# Patient Record
Sex: Male | Born: 1950
Health system: Southern US, Community
[De-identification: ages and names within clinical notes are randomized; demographics above are authoritative.]

## PROBLEM LIST (undated history)

## (undated) DIAGNOSIS — N400 Enlarged prostate without lower urinary tract symptoms: Secondary | ICD-10-CM

## (undated) DIAGNOSIS — I10 Essential (primary) hypertension: Secondary | ICD-10-CM

## (undated) DIAGNOSIS — I4891 Unspecified atrial fibrillation: Secondary | ICD-10-CM

## (undated) DIAGNOSIS — G4730 Sleep apnea, unspecified: Secondary | ICD-10-CM

## (undated) DIAGNOSIS — K219 Gastro-esophageal reflux disease without esophagitis: Secondary | ICD-10-CM

## (undated) DIAGNOSIS — R200 Anesthesia of skin: Secondary | ICD-10-CM

## (undated) HISTORY — PX: SHOULDER SURGERY: SHX246

## (undated) HISTORY — PX: NASAL RECONSTRUCTION WITH SEPTAL REPAIR: SHX5665

## (undated) HISTORY — PX: TONSILLECTOMY: SUR1361

## (undated) HISTORY — PX: REPLACEMENT TOTAL KNEE: SUR1224

---

## 2007-01-31 ENCOUNTER — Ambulatory Visit: Payer: Self-pay | Admitting: Ophthalmology

## 2008-09-21 ENCOUNTER — Inpatient Hospital Stay (HOSPITAL_COMMUNITY): Admission: EM | Admit: 2008-09-21 | Discharge: 2008-09-23 | Payer: Self-pay | Admitting: Gastroenterology

## 2008-09-22 ENCOUNTER — Encounter (INDEPENDENT_AMBULATORY_CARE_PROVIDER_SITE_OTHER): Payer: Self-pay | Admitting: Gastroenterology

## 2010-07-14 LAB — CBC
HCT: 22.5 % — ABNORMAL LOW (ref 39.0–52.0)
HCT: 23.5 % — ABNORMAL LOW (ref 39.0–52.0)
HCT: 26.3 % — ABNORMAL LOW (ref 39.0–52.0)
Hemoglobin: 7.7 g/dL — CL (ref 13.0–17.0)
Hemoglobin: 7.9 g/dL — CL (ref 13.0–17.0)
Hemoglobin: 8.9 g/dL — ABNORMAL LOW (ref 13.0–17.0)
MCHC: 33.7 g/dL (ref 30.0–36.0)
MCHC: 33.8 g/dL (ref 30.0–36.0)
MCHC: 33.9 g/dL (ref 30.0–36.0)
MCV: 86.8 fL (ref 78.0–100.0)
MCV: 87.8 fL (ref 78.0–100.0)
MCV: 89 fL (ref 78.0–100.0)
Platelets: 226 10*3/uL (ref 150–400)
Platelets: 226 10*3/uL (ref 150–400)
Platelets: 236 10*3/uL (ref 150–400)
RBC: 2.6 MIL/uL — ABNORMAL LOW (ref 4.22–5.81)
RBC: 2.68 MIL/uL — ABNORMAL LOW (ref 4.22–5.81)
RBC: 2.96 MIL/uL — ABNORMAL LOW (ref 4.22–5.81)
RDW: 14.4 % (ref 11.5–15.5)
RDW: 14.4 % (ref 11.5–15.5)
RDW: 14.9 % (ref 11.5–15.5)
WBC: 10.8 10*3/uL — ABNORMAL HIGH (ref 4.0–10.5)
WBC: 9.1 10*3/uL (ref 4.0–10.5)
WBC: 9.7 10*3/uL (ref 4.0–10.5)

## 2010-07-14 LAB — BASIC METABOLIC PANEL
BUN: 19 mg/dL (ref 6–23)
BUN: 23 mg/dL (ref 6–23)
CO2: 25 mEq/L (ref 19–32)
CO2: 27 mEq/L (ref 19–32)
Calcium: 7.5 mg/dL — ABNORMAL LOW (ref 8.4–10.5)
Calcium: 7.8 mg/dL — ABNORMAL LOW (ref 8.4–10.5)
Chloride: 108 mEq/L (ref 96–112)
Chloride: 112 mEq/L (ref 96–112)
Creatinine, Ser: 0.78 mg/dL (ref 0.4–1.5)
Creatinine, Ser: 0.88 mg/dL (ref 0.4–1.5)
GFR calc Af Amer: >60 mL/min (ref >60)
GFR calc Af Amer: >60 mL/min (ref >60)
GFR calc non Af Amer: >60 mL/min (ref >60)
GFR calc non Af Amer: >60 mL/min (ref >60)
Glucose, Bld: 98 mg/dL (ref 70–99)
Glucose, Bld: 99 mg/dL (ref 70–99)
Potassium: 3.6 mEq/L (ref 3.5–5.1)
Potassium: 4.3 mEq/L (ref 3.5–5.1)
Sodium: 137 mEq/L (ref 135–145)
Sodium: 140 mEq/L (ref 135–145)

## 2010-07-14 LAB — CROSSMATCH
ABO/RH(D): A POS
Antibody Screen: NEGATIVE

## 2010-07-14 LAB — DIFFERENTIAL
Basophils Absolute: 0 10*3/uL (ref 0.0–0.1)
Basophils Relative: 0 % (ref 0–1)
Eosinophils Absolute: 0.6 10*3/uL (ref 0.0–0.7)
Eosinophils Relative: 6 % — ABNORMAL HIGH (ref 0–5)
Lymphocytes Relative: 27 % (ref 12–46)
Lymphs Abs: 2.6 10*3/uL (ref 0.7–4.0)
Monocytes Absolute: 0.6 10*3/uL (ref 0.1–1.0)
Monocytes Relative: 7 % (ref 3–12)
Neutro Abs: 5.8 10*3/uL (ref 1.7–7.7)
Neutrophils Relative %: 61 % (ref 43–77)

## 2010-07-14 LAB — PREPARE RBC (CROSSMATCH)

## 2010-07-14 LAB — ABO/RH: ABO/RH(D): A POS

## 2010-08-19 NOTE — H&P (Signed)
Jeffrey French, Jeffrey French                 ACCOUNT NO.:  [id number]   MEDICAL RECORD NO.:  [id number]          PATIENT TYPE:  INP   LOCATION:  4713                         FACILITY:  [facility]   PHYSICIAN:  Jordan Hawks. Elnoria Howard, MD    DATE OF BIRTH:  Mar 13, 1951   DATE OF ADMISSION:  09/21/2008  DATE OF DISCHARGE:                              HISTORY & PHYSICAL   REASON FOR ADMISSION:  GI bleed.   HISTORY OF PRESENT ILLNESS:  This is a 60 year old gentleman with a past  medical history of depression who is admitted to the hospital with  complaints of orthostasis.  The patient states that for the past week,  he has noted feeling a little bit dizzy with standing up, but this is  not significant for him to seek medical treatment.  Yesterday, he was  working in his yard.  He noticed that he was markedly worse in regards  to the weakness and fatigue.  His arms and legs were very tired from a  sitting to standing position.  However, he did not feel that he had any  significant issues on the day of admission.  The patient was working in  his garden, and his symptoms continued to worsen.  He went to the  bathroom and had a bowel movement.  At that time, he had multiple bowel  movements; and during those episodes, he had difficulty with standing  from the toilet.  Each time he was standing he felt markedly weak to the  point where he almost passed out.  During his final bout of having a  bowel movement, his wife had to assist him, and at that time, she noted  that his last stools were black.  The patient does not usually look at  his stools and did not noticed any type of abnormalities before this  time.  Because of the major significant symptoms, he presented to the  emergency room at [facility] for further evaluation and treatment.  EMS was called, and he was noted to be orthostatic by pulse.  Upon  presentation to [facility], routine blood work was performed.  It  was noted that his hemoglobin  was in the eight range.  His baseline is  unknown at this time.  His BUN is elevated at 42 with a creatinine of  1.1, and he had normal coags.  The patient denies any complaints of  abdominal pain or diarrhea.  No prior history of peptic ulcer disease.  He has never had a colonoscopy, and his mother had colon cancer at the  age of 62.  He is used 1 dose of Goody Powder approximately 1 week ago,  but denies ever using any routine NSAID use.   PAST MEDICAL HISTORY AND PAST SURGICAL HISTORY:  As stated above.   FAMILY HISTORY:  As stated above in the history of present illness.   SOCIAL HISTORY:  Negative for alcohol, tobacco, or illicit drug use.   REVIEW OF SYSTEMS:  As stated above in the history of present illness,  otherwise negative.   HOME MEDICATIONS:  1.  Wellbutrin 150 mg p.o. daily.  2. Effexor 225 mg p.o. daily.   ALLERGIES:  No known drug allergies.   PHYSICAL EXAMINATION:  VITAL SIGNS:  Blood pressure is 121/76, heart  rate is 97, respirations 18, and temperature is 99.3.  GENERAL:  The patient is in no acute distress, alert and oriented.  HEENT:  Normocephalic, atraumatic.  Extraocular muscles intact.  NECK:  Supple.  No lymphadenopathy.  LUNGS:  Clear to auscultation bilaterally.  CARDIOVASCULAR:  Regular rate and rhythm although he is tachycardic.  ABDOMEN:  Flat, soft, nontender, and nondistended.  Positive bowel  sounds.  EXTREMITIES:  No clubbing, cyanosis, or edema.  RECTAL:  Heme positive and grossly melenic.   LABORATORY VALUES:  White blood cell count is 10.3, hemoglobin 8.0, MCV  is 84.5, and platelets at 295.  Sodium 140, potassium 4.3, chloride 103,  CO2 of 26, glucose 161, BUN 42, creatinine 1.1.  PT is 10.1, INR is 0.9,  and PTT is 17.4.  These laboratory values were performed at [facility].   IMPRESSION:  1. Gastrointestinal bleed, probable upper.  2. Orthostatic hypertension.  3. Family history of colon cancer.   At this time, the  patient will require an EGD for further evaluation;  however, he has orthostatic hypertension.  Upon rest, I am able to  detect that the patient is tachycardic.  He will require aggressive  rehydration and blood transfusions.   PLAN:  1. IV hydration.  2. Transfused 2 units of packed red blood cells now.  3. Protonix 40 mg p.o. daily.  4. EGD in the a.m., and further recommendation is pending to findings      of the EGD.      Jordan Hawks Elnoria Howard, MD  Electronically Signed     PDH/MEDQ  D:  09/21/2008  T:  09/22/2008  Job:  [id number]

## 2010-08-19 NOTE — Discharge Summary (Signed)
Jeffrey French, Jeffrey French                 ACCOUNT NO.:  [id number]   MEDICAL RECORD NO.:  [id number]          PATIENT TYPE:  INP   LOCATION:  4713                         FACILITY:  [facility]   PHYSICIAN:  Jordan Hawks. Elnoria Howard, MD    DATE OF BIRTH:  07/09/1950   DATE OF ADMISSION:  09/21/2008  DATE OF DISCHARGE:  09/23/2008                               DISCHARGE SUMMARY   DISCHARGE DIAGNOSES:  1. Duodenal ulcer.  2. Orthostatic hypotension secondary to the bleeding ulcer.  3. Depression.   HISTORY AND PHYSICAL:  Please see the original history and physical for  full details.   HOSPITAL COURSE:  The patient was admitted to the hospital and  aggressively hydrated with IV fluids and he was transfused with 2 units  of packed red blood cells.  Unfortunately, this did increase his  hemoglobin to a significant amount.  He was noted to be at 8.0 at the  outside hospital and after 2 units at [facility] he rose to 7.9.  Subsequently, he underwent an EGD and he was noted to have a nonbleeding  clean-based duodenal ulcer.  No other significant abnormalities were  identified, although biopsies of the gastric antrum were obtained for  mild gastritis.  Overall, the patient tolerated the procedure well and  because of his continued orthostasis, he was transfused an additional 2  units of packed red blood cells.  On the day of discharge, the patient  felt well and in fact on the day prior, he was able to ambulate without  difficulty.  No further complaints of dizziness and no complaints of any  melena.  His hemoglobin rose to approximately 8.9 and repeat orthostatic  parameters were negative for hypotension.  Because the patient did not  have any further complaints, it was felt that he could be discharged  home safely.  During this hospitalization, he was also treated with  Protonix 40 mg p.o. daily.   PLAN:  1. Follow up with Dr. Elnoria Howard in 2 weeks.  2. Take omeprazole 40 mg p.o. daily.  3. Iron  supplementation over-the-counter.  He is to increase to t.i.d.      if tolerated and the patient is also instructed to call me if he      should have any further signs or symptoms of bleeding.      Jordan Hawks Elnoria Howard, MD  Electronically Signed     PDH/MEDQ  D:  09/23/2008  T:  09/23/2008  Job:  [id number]

## 2010-12-15 ENCOUNTER — Ambulatory Visit: Payer: Self-pay | Admitting: Orthopedic Surgery

## 2010-12-24 ENCOUNTER — Inpatient Hospital Stay: Payer: Self-pay | Admitting: Specialist

## 2011-01-13 ENCOUNTER — Encounter: Payer: Self-pay | Admitting: Orthopedic Surgery

## 2011-02-05 ENCOUNTER — Ambulatory Visit: Payer: Self-pay | Admitting: Orthopedic Surgery

## 2011-02-05 ENCOUNTER — Encounter: Payer: Self-pay | Admitting: Orthopedic Surgery

## 2011-02-19 ENCOUNTER — Inpatient Hospital Stay: Payer: Self-pay | Admitting: Orthopedic Surgery

## 2011-03-04 ENCOUNTER — Emergency Department: Payer: Self-pay | Admitting: Emergency Medicine

## 2011-03-11 ENCOUNTER — Emergency Department: Payer: Self-pay | Admitting: Emergency Medicine

## 2011-03-16 ENCOUNTER — Encounter: Payer: Self-pay | Admitting: Orthopedic Surgery

## 2011-04-07 ENCOUNTER — Encounter: Payer: Self-pay | Admitting: Orthopedic Surgery

## 2013-08-10 LAB — LIPID PANEL
Cholesterol: 185 mg/dL (ref 0–200)
HDL: 47 mg/dL (ref 35–70)
LDL Cholesterol: 103 mg/dL
LDl/HDL Ratio: 2.2
Triglycerides: 175 mg/dL — AB (ref 40–160)

## 2013-08-10 LAB — CBC AND DIFFERENTIAL
HCT: 44 % (ref 41–53)
Hemoglobin: 14.1 g/dL (ref 13.5–17.5)
Neutrophils Absolute: 4 /uL
Platelets: 331 10*3/uL (ref 150–399)
WBC: 8.2 10^3/mL

## 2013-08-10 LAB — HEPATIC FUNCTION PANEL
ALT: 20 U/L (ref 10–40)
AST: 20 U/L (ref 14–40)
Alkaline Phosphatase: 98 U/L (ref 25–125)
Bilirubin, Total: 1.1 mg/dL

## 2013-08-10 LAB — BASIC METABOLIC PANEL
BUN: 14 mg/dL (ref 4–21)
Creatinine: 1.1 mg/dL (ref 0.6–1.3)
Glucose: 92 mg/dL
Potassium: 4.8 mmol/L (ref 3.4–5.3)
Sodium: 138 mmol/L (ref 137–147)

## 2013-08-10 LAB — TSH: TSH: 1.92 u[IU]/mL (ref 0.41–5.90)

## 2013-09-15 ENCOUNTER — Ambulatory Visit: Payer: Self-pay | Admitting: Physician Assistant

## 2013-10-19 DIAGNOSIS — R972 Elevated prostate specific antigen [PSA]: Secondary | ICD-10-CM | POA: Insufficient documentation

## 2013-11-21 LAB — HM COLONOSCOPY

## 2014-02-08 LAB — PSA: PSA: 8

## 2014-03-22 DIAGNOSIS — Z87891 Personal history of nicotine dependence: Secondary | ICD-10-CM | POA: Diagnosis not present

## 2014-03-22 DIAGNOSIS — M1990 Unspecified osteoarthritis, unspecified site: Secondary | ICD-10-CM | POA: Diagnosis not present

## 2014-03-22 DIAGNOSIS — N138 Other obstructive and reflux uropathy: Secondary | ICD-10-CM | POA: Diagnosis not present

## 2014-03-22 DIAGNOSIS — R972 Elevated prostate specific antigen [PSA]: Secondary | ICD-10-CM | POA: Diagnosis not present

## 2014-03-22 DIAGNOSIS — N401 Enlarged prostate with lower urinary tract symptoms: Secondary | ICD-10-CM | POA: Diagnosis not present

## 2014-03-22 DIAGNOSIS — N3941 Urge incontinence: Secondary | ICD-10-CM | POA: Diagnosis not present

## 2014-03-22 DIAGNOSIS — R3914 Feeling of incomplete bladder emptying: Secondary | ICD-10-CM | POA: Diagnosis not present

## 2014-03-22 DIAGNOSIS — Z79899 Other long term (current) drug therapy: Secondary | ICD-10-CM | POA: Diagnosis not present

## 2014-03-22 DIAGNOSIS — F329 Major depressive disorder, single episode, unspecified: Secondary | ICD-10-CM | POA: Diagnosis not present

## 2014-03-22 DIAGNOSIS — I10 Essential (primary) hypertension: Secondary | ICD-10-CM | POA: Diagnosis not present

## 2014-05-25 DIAGNOSIS — H15002 Unspecified scleritis, left eye: Secondary | ICD-10-CM | POA: Diagnosis not present

## 2014-06-14 DIAGNOSIS — M545 Low back pain: Secondary | ICD-10-CM | POA: Diagnosis not present

## 2014-06-14 DIAGNOSIS — M5416 Radiculopathy, lumbar region: Secondary | ICD-10-CM | POA: Diagnosis not present

## 2014-06-23 ENCOUNTER — Ambulatory Visit: Payer: Self-pay | Admitting: Orthopedic Surgery

## 2014-06-23 DIAGNOSIS — M4316 Spondylolisthesis, lumbar region: Secondary | ICD-10-CM | POA: Diagnosis not present

## 2014-06-23 DIAGNOSIS — M4806 Spinal stenosis, lumbar region: Secondary | ICD-10-CM | POA: Diagnosis not present

## 2014-06-23 DIAGNOSIS — M5126 Other intervertebral disc displacement, lumbar region: Secondary | ICD-10-CM | POA: Diagnosis not present

## 2014-06-26 DIAGNOSIS — M5126 Other intervertebral disc displacement, lumbar region: Secondary | ICD-10-CM | POA: Diagnosis not present

## 2014-08-06 DIAGNOSIS — J0140 Acute pansinusitis, unspecified: Secondary | ICD-10-CM | POA: Diagnosis not present

## 2014-08-21 DIAGNOSIS — M1991 Primary osteoarthritis, unspecified site: Secondary | ICD-10-CM | POA: Diagnosis not present

## 2014-08-21 DIAGNOSIS — Z0000 Encounter for general adult medical examination without abnormal findings: Secondary | ICD-10-CM | POA: Diagnosis not present

## 2014-08-21 DIAGNOSIS — I10 Essential (primary) hypertension: Secondary | ICD-10-CM | POA: Diagnosis not present

## 2014-10-30 DIAGNOSIS — H903 Sensorineural hearing loss, bilateral: Secondary | ICD-10-CM | POA: Diagnosis not present

## 2014-11-08 DIAGNOSIS — R972 Elevated prostate specific antigen [PSA]: Secondary | ICD-10-CM | POA: Diagnosis not present

## 2014-11-08 DIAGNOSIS — N401 Enlarged prostate with lower urinary tract symptoms: Secondary | ICD-10-CM | POA: Diagnosis not present

## 2014-11-08 DIAGNOSIS — N138 Other obstructive and reflux uropathy: Secondary | ICD-10-CM | POA: Diagnosis not present

## 2014-11-08 DIAGNOSIS — R339 Retention of urine, unspecified: Secondary | ICD-10-CM | POA: Diagnosis not present

## 2014-11-24 ENCOUNTER — Other Ambulatory Visit: Payer: Self-pay | Admitting: Family Medicine

## 2015-02-12 ENCOUNTER — Telehealth: Payer: Self-pay | Admitting: Family Medicine

## 2015-02-12 NOTE — Telephone Encounter (Signed)
[employer] contacted him and said the   Venlafaxine HCl...  They would only pay for 30 per month.  They need you to authorize 90 per month.  Pt's contact number is [phone].  Please leave a message if no one answers.  Thanks Con Memos

## 2015-02-12 NOTE — Telephone Encounter (Signed)
lmtcb-aa, need to get clarification on what is needed,

## 2015-02-14 NOTE — Telephone Encounter (Signed)
Called no answer and unable to leave a message-aa Have made attempts to get in touch with patient Will wait for patient to call us back to let us know what exactly is needed.

## 2015-02-18 DIAGNOSIS — F329 Major depressive disorder, single episode, unspecified: Secondary | ICD-10-CM | POA: Insufficient documentation

## 2015-02-18 DIAGNOSIS — F32A Depression, unspecified: Secondary | ICD-10-CM | POA: Insufficient documentation

## 2015-02-18 DIAGNOSIS — G4700 Insomnia, unspecified: Secondary | ICD-10-CM | POA: Insufficient documentation

## 2015-02-18 DIAGNOSIS — F411 Generalized anxiety disorder: Secondary | ICD-10-CM | POA: Insufficient documentation

## 2015-02-18 DIAGNOSIS — R972 Elevated prostate specific antigen [PSA]: Secondary | ICD-10-CM | POA: Insufficient documentation

## 2015-02-18 DIAGNOSIS — N400 Enlarged prostate without lower urinary tract symptoms: Secondary | ICD-10-CM | POA: Insufficient documentation

## 2015-02-18 DIAGNOSIS — K279 Peptic ulcer, site unspecified, unspecified as acute or chronic, without hemorrhage or perforation: Secondary | ICD-10-CM | POA: Insufficient documentation

## 2015-02-18 DIAGNOSIS — M1990 Unspecified osteoarthritis, unspecified site: Secondary | ICD-10-CM | POA: Insufficient documentation

## 2015-02-18 DIAGNOSIS — D649 Anemia, unspecified: Secondary | ICD-10-CM | POA: Insufficient documentation

## 2015-02-18 DIAGNOSIS — I10 Essential (primary) hypertension: Secondary | ICD-10-CM | POA: Insufficient documentation

## 2015-02-20 ENCOUNTER — Ambulatory Visit (INDEPENDENT_AMBULATORY_CARE_PROVIDER_SITE_OTHER): Payer: Medicare Other | Admitting: Family Medicine

## 2015-02-20 ENCOUNTER — Encounter: Payer: Self-pay | Admitting: Family Medicine

## 2015-02-20 VITALS — BP 134/64 | HR 68 | Temp 97.6°F | Resp 16 | Wt 227.0 lb

## 2015-02-20 DIAGNOSIS — R432 Parageusia: Secondary | ICD-10-CM

## 2015-02-20 DIAGNOSIS — F32A Depression, unspecified: Secondary | ICD-10-CM

## 2015-02-20 DIAGNOSIS — R430 Anosmia: Secondary | ICD-10-CM

## 2015-02-20 DIAGNOSIS — F329 Major depressive disorder, single episode, unspecified: Secondary | ICD-10-CM

## 2015-02-20 DIAGNOSIS — I10 Essential (primary) hypertension: Secondary | ICD-10-CM

## 2015-02-20 DIAGNOSIS — G2581 Restless legs syndrome: Secondary | ICD-10-CM | POA: Diagnosis not present

## 2015-02-20 MED ORDER — ROPINIROLE HCL 2 MG PO TABS: 2.0000 mg | ORAL_TABLET | Freq: Every day | ORAL | Status: DC

## 2015-02-20 MED ORDER — VENLAFAXINE HCL ER 75 MG PO CP24: 225.0000 mg | ORAL_CAPSULE | Freq: Every day | ORAL | Status: DC

## 2015-02-20 NOTE — Progress Notes (Signed)
Patient ID: Jeffrey French, male   DOB: 07-04-1950, 64 y.o.   MRN: [id number]    Subjective:  HPI  Hypertension, follow-up:  BP Readings from Last 3 Encounters:  02/20/15 134/64  08/21/14 128/62    He was last seen for hypertension 6 months ago.  BP at that visit was 128/62. Management since that visit includes none. He reports good compliance with treatment. He is not having side effects.  He is not exercising.  Outside blood pressures are being checked occasionally. He is experiencing none.  Patient denies chest pain, chest pressure/discomfort, claudication, dyspnea, exertional chest pressure/discomfort, fatigue, irregular heart beat, lower extremity edema and palpitations.   Cardiovascular risk factors include advanced age (older than 47 for men, 57 for women), hypertension and male gender.   Wt Readings from Last 3 Encounters:  02/20/15 227 lb (102.967 kg)  08/21/14 238 lb (107.956 kg)    ------------------------------------------------------------------------   Pt reports that he is doing well emotionally. He has a paper from his [employer] stating that they will not cover Venlafaxine. He does not want to stop it because he has been stable on it for so long, so he may need PA. It is of note that for the past 2 months the patient has had a decreased sense of taste and smell. No other symptoms or neurologic issues. No other ENT issues. Asian also has some mild to moderate issues with restless leg syndrome through the night. He does wish to try to treat this. Prior to Admission medications   Medication Sig Start Date End Date Taking? Authorizing Provider  alprazolam Duanne Moron) 2 MG tablet Take by mouth. 03/10/11  Yes Historical Provider, MD  amLODipine (NORVASC) 5 MG tablet Take by mouth. 04/15/14  Yes Historical Provider, MD  baclofen (LIORESAL) 10 MG tablet Take by mouth. 06/07/14  Yes Historical Provider, MD  buPROPion (WELLBUTRIN XL) 300 MG 24 hr tablet Take by mouth.  04/15/14  Yes Historical Provider, MD  chlorpheniramine (CHLOR-TRIMETON) 4 MG tablet Take by mouth.   Yes Historical Provider, MD  Cholecalciferol (VITAMIN D) 2000 UNITS CAPS Take by mouth. 02/10/11  Yes Historical Provider, MD  folic acid (FOLVITE) 629 MCG tablet Take by mouth. 02/10/11  Yes Historical Provider, MD  Melatonin 1 MG TABS Take by mouth.   Yes Historical Provider, MD  metoprolol succinate (TOPROL-XL) 50 MG 24 hr tablet 1 (ONE) TABLET ER 24HR TABLET ER 24HR, ORAL, DAILY 11/24/14  Yes Jerrol Banana., MD  omeprazole (PRILOSEC) 20 MG capsule Take by mouth. 08/21/14  Yes Historical Provider, MD  tamsulosin (FLOMAX) 0.4 MG CAPS capsule Take by mouth. 02/08/14  Yes Historical Provider, MD  temazepam (RESTORIL) 30 MG capsule Take by mouth. 11/21/12  Yes Historical Provider, MD  venlafaxine XR (EFFEXOR-XR) 75 MG 24 hr capsule Take by mouth. 04/15/14  Yes Historical Provider, MD  meloxicam (MOBIC) 15 MG tablet Take by mouth. 06/07/14   Historical Provider, MD    Patient Active Problem List   Diagnosis Date Noted  . Absolute anemia 02/18/2015  . Benign fibroma of prostate 02/18/2015  . Clinical depression 02/18/2015  . Abnormal prostate specific antigen 02/18/2015  . Essential (primary) hypertension 02/18/2015  . Anxiety, generalized 02/18/2015  . Cannot sleep 02/18/2015  . Arthritis, degenerative 02/18/2015  . Peptic ulcer 02/18/2015  . Elevated prostate specific antigen (PSA) 10/19/2013    History reviewed. No pertinent past medical history.  Social History   Social History  . Marital Status: Married    Spouse  Name: N/A  . Number of Children: N/A  . Years of Education: N/A   Occupational History  . Not on file.   Social History Main Topics  . Smoking status: Former Research scientist (life sciences)  . Smokeless tobacco: Not on file     Comment: 1974  . Alcohol Use: Yes     Comment: occasionally  . Drug Use: No  . Sexual Activity: Not on file   Other Topics Concern  . Not on file   Social  History Narrative    Allergies  Allergen Reactions  . Sulfamethoxazole-Trimethoprim     Review of Systems  Constitutional: Negative.   HENT: Negative.   Eyes: Negative.   Respiratory: Negative.   Cardiovascular: Negative.   Gastrointestinal: Negative.   Genitourinary: Negative.   Musculoskeletal: Negative.   Skin: Negative.   Neurological: Negative.   Endo/Heme/Allergies: Negative.   Psychiatric/Behavioral: Negative.     Immunization History  Administered Date(s) Administered  . Hepatitis A 11/29/2009  . Tdap 11/29/2009  . Zoster 01/19/2013   Objective:  BP 134/64 mmHg  Pulse 68  Temp(Src) 97.6 F (36.4 C) (Oral)  Resp 16  Wt 227 lb (102.967 kg)  Physical Exam  Constitutional: He is oriented to person, place, and time and well-developed, well-nourished, and in no distress.  HENT:  Head: Normocephalic and atraumatic.  Right Ear: External ear normal.  Left Ear: External ear normal.  Nose: Nose normal.  Mouth/Throat: Oropharynx is clear and moist.  Eyes: Conjunctivae and EOM are normal. Pupils are equal, round, and reactive to light.  Neck: Neck supple.  Cardiovascular: Normal rate, regular rhythm, normal heart sounds and intact distal pulses.   Pulmonary/Chest: Effort normal and breath sounds normal.  Abdominal: Soft.  Neurological: He is alert and oriented to person, place, and time. Gait normal.  Skin: Skin is warm and dry.  Psychiatric: Mood, memory, affect and judgment normal.    Lab Results  Component Value Date   WBC 8.2 08/10/2013   HGB 14.1 08/10/2013   HCT 44 08/10/2013   PLT 331 08/10/2013   GLUCOSE 98 09/22/2008   CHOL 185 08/10/2013   TRIG 175* 08/10/2013   HDL 47 08/10/2013   LDLCALC 103 08/10/2013   TSH 1.92 08/10/2013   PSA 8.0 02/08/2014    CMP     Component Value Date/Time   NA 138 08/10/2013   NA 140 09/22/2008 1235   K 4.8 08/10/2013   CL 112 09/22/2008 1235   CO2 25 09/22/2008 1235   GLUCOSE 98 09/22/2008 1235   BUN 14  08/10/2013   BUN 19 09/22/2008 1235   CREATININE 1.1 08/10/2013   CREATININE 0.78 09/22/2008 1235   CALCIUM 7.5* 09/22/2008 1235   AST 20 08/10/2013   ALT 20 08/10/2013   ALKPHOS 98 08/10/2013   GFRNONAA >60 09/22/2008 1235   GFRAA  09/22/2008 1235    >60        The eGFR has been calculated using the MDRD equation. This calculation has not been validated in all clinical situations. eGFR's persistently <60 mL/min signify possible Chronic Kidney Disease.    Assessment and Plan :  1. Essential (primary) hypertension Presently controlled.  2. Clinical depression In remission. Continue present medications indefinitely. - venlafaxine XR (EFFEXOR-XR) 75 MG 24 hr capsule; Take 3 capsules (225 mg total) by mouth daily with breakfast.  Dispense: 90 capsule; Refill: 12  3. Restless leg syndrome  - rOPINIRole (REQUIP) 2 MG tablet; Take 1 tablet (2 mg total) by mouth at bedtime.  Dispense: 60 tablet; Refill: 5  4. Loss of smell  - Ambulatory referral to ENT  5. Loss of taste  - Ambulatory referral to ENT   [facility] 02/20/2015 8:22 AM

## 2015-03-08 ENCOUNTER — Ambulatory Visit (INDEPENDENT_AMBULATORY_CARE_PROVIDER_SITE_OTHER): Payer: Medicare Other | Admitting: Family Medicine

## 2015-03-08 ENCOUNTER — Encounter: Payer: Self-pay | Admitting: Family Medicine

## 2015-03-08 ENCOUNTER — Ambulatory Visit: Payer: Medicare Other | Admitting: Family Medicine

## 2015-03-08 VITALS — BP 152/68 | HR 80 | Temp 97.9°F | Resp 16 | Wt 229.0 lb

## 2015-03-08 DIAGNOSIS — J0100 Acute maxillary sinusitis, unspecified: Secondary | ICD-10-CM

## 2015-03-08 MED ORDER — AMOXICILLIN-POT CLAVULANATE 875-125 MG PO TABS: 1.0000 | ORAL_TABLET | Freq: Two times a day (BID) | ORAL | Status: DC

## 2015-03-08 NOTE — Progress Notes (Signed)
Patient ID: IZIK CHHOEUN, male   DOB: 08-08-1950, 64 y.o.   MRN: [id number] Name: Jeffrey French   MRN: [id number]    DOB: [birth year]   Date:03/08/2015       Progress Note  Subjective  Chief Complaint  Chief Complaint  Patient presents with  . Sinusitis    Sinusitis This is a new problem. The current episode started 1 to 4 weeks ago. The problem has been gradually worsening since onset. Associated symptoms include congestion and sinus pressure. Past treatments include nothing.  Usually sees Dr. Tami Ribas once this time of year for the same sinusitis. Having pressure and headache around eyes with rhinorrhea since 02-20-15. Using some Claritin for allergies. Denies cough or fever. Using some expectorant with decongestant.  Past Surgical History  Procedure Laterality Date  . Nasal reconstruction with septal repair    . Tonsillectomy    . Replacement total knee Right   . Shoulder surgery Left    Family History  Problem Relation Age of Onset  . Arthritis Mother   . Hypertension Mother   . Colon cancer Mother   . Other Mother     lymph node  . Diabetes Father   . Congestive Heart Failure Father   . Arthritis Sister    Patient Active Problem List   Diagnosis Date Noted  . Absolute anemia 02/18/2015  . Benign fibroma of prostate 02/18/2015  . Clinical depression 02/18/2015  . Abnormal prostate specific antigen 02/18/2015  . Essential (primary) hypertension 02/18/2015  . Anxiety, generalized 02/18/2015  . Cannot sleep 02/18/2015  . Arthritis, degenerative 02/18/2015  . Peptic ulcer 02/18/2015  . Elevated prostate specific antigen (PSA) 10/19/2013      Social History  Substance Use Topics  . Smoking status: Former Research scientist (life sciences)  . Smokeless tobacco: Not on file     Comment: 1974  . Alcohol Use: Yes     Comment: occasionally    Current outpatient prescriptions:  .  alprazolam (XANAX) 2 MG tablet, Take by mouth., Disp: , Rfl:  .  amLODipine (NORVASC) 5 MG tablet, Take by mouth.,  Disp: , Rfl:  .  baclofen (LIORESAL) 10 MG tablet, Take by mouth., Disp: , Rfl:  .  buPROPion (WELLBUTRIN XL) 300 MG 24 hr tablet, Take by mouth., Disp: , Rfl:  .  chlorpheniramine (CHLOR-TRIMETON) 4 MG tablet, Take by mouth., Disp: , Rfl:  .  Cholecalciferol (VITAMIN D) 2000 UNITS CAPS, Take by mouth., Disp: , Rfl:  .  folic acid (FOLVITE) Q000111Q MCG tablet, Take by mouth., Disp: , Rfl:  .  Melatonin 1 MG TABS, Take by mouth., Disp: , Rfl:  .  meloxicam (MOBIC) 15 MG tablet, Take by mouth., Disp: , Rfl:  .  metoprolol succinate (TOPROL-XL) 50 MG 24 hr tablet, 1 (ONE) TABLET ER 24HR TABLET ER 24HR, ORAL, DAILY, Disp: 30 tablet, Rfl: 8 .  omeprazole (PRILOSEC) 20 MG capsule, Take by mouth., Disp: , Rfl:  .  rOPINIRole (REQUIP) 2 MG tablet, Take 1 tablet (2 mg total) by mouth at bedtime., Disp: 60 tablet, Rfl: 5 .  tamsulosin (FLOMAX) 0.4 MG CAPS capsule, Take by mouth., Disp: , Rfl:  .  temazepam (RESTORIL) 30 MG capsule, Take by mouth., Disp: , Rfl:  .  venlafaxine XR (EFFEXOR-XR) 75 MG 24 hr capsule, Take 3 capsules (225 mg total) by mouth daily with breakfast., Disp: 90 capsule, Rfl: 12  Allergies  Allergen Reactions  . Sulfamethoxazole-Trimethoprim     Review of Systems  Constitutional: Negative.  HENT: Positive for congestion and sinus pressure.   Eyes: Negative.   Respiratory: Negative.   Cardiovascular: Negative.   Gastrointestinal: Negative.   Genitourinary: Negative.   Musculoskeletal: Negative.   Skin: Negative.   Neurological: Negative.   Endo/Heme/Allergies: Negative.   Psychiatric/Behavioral: Negative.     Objective  Filed Vitals:   03/08/15 1054  BP: 152/68  Pulse: 80  Temp: 97.9 F (36.6 C)  TempSrc: Oral  Resp: 16  Weight: 229 lb (103.874 kg)  SpO2: 97%    Physical Exam  Constitutional: He is oriented to person, place, and time and well-developed, well-nourished, and in no distress.  HENT:  Head: Normocephalic and atraumatic.  Right Ear: External ear  normal.  Left Ear: External ear normal.  Slightly reddened nasal membranes with thick yellow post nasal drip and erythema to posterior pharynx.  Eyes: Conjunctivae and EOM are normal. Pupils are equal, round, and reactive to light.  Neck: Neck supple.  Cardiovascular: Normal rate, regular rhythm and normal heart sounds.   Pulmonary/Chest: Effort normal and breath sounds normal.  Abdominal: Soft. Bowel sounds are normal.  Lymphadenopathy:    He has no cervical adenopathy.  Neurological: He is alert and oriented to person, place, and time.    Assessment & Plan  1. Acute maxillary sinusitis, recurrence not specified Onset over the past couple weeks with progression to headache and sinus pain this week. May continue expectorant with decongestant and add Flonase-OTC. May use Netti-Pot irrigation as needed. Add antibiotic and follow up with Dr. Tami Ribas as needed. - amoxicillin-clavulanate (AUGMENTIN) 875-125 MG tablet; Take 1 tablet by mouth 2 (two) times daily.  Dispense: 20 tablet; Refill: 0

## 2015-03-13 DIAGNOSIS — N138 Other obstructive and reflux uropathy: Secondary | ICD-10-CM | POA: Diagnosis not present

## 2015-03-13 DIAGNOSIS — R972 Elevated prostate specific antigen [PSA]: Secondary | ICD-10-CM | POA: Diagnosis not present

## 2015-03-13 DIAGNOSIS — R339 Retention of urine, unspecified: Secondary | ICD-10-CM | POA: Diagnosis not present

## 2015-03-13 DIAGNOSIS — N401 Enlarged prostate with lower urinary tract symptoms: Secondary | ICD-10-CM | POA: Diagnosis not present

## 2015-03-14 ENCOUNTER — Ambulatory Visit: Payer: Medicare Other | Admitting: Family Medicine

## 2015-05-23 ENCOUNTER — Other Ambulatory Visit: Payer: Self-pay | Admitting: Family Medicine

## 2015-06-06 ENCOUNTER — Telehealth: Payer: Self-pay

## 2015-06-06 NOTE — Telephone Encounter (Signed)
lmtcb-aa, insurance sent a letter in regards to his Venlafaxine 75 mg 3 po QD, they will only cover 30 for 30 not 90 for 30. Need to know if he changed insurance because he has been on this dose like that for at least a year (in the old system too), can he take 225 mg was it just too expensive?-aa

## 2015-06-10 NOTE — Telephone Encounter (Signed)
lmtcb-aa 

## 2015-06-11 NOTE — Telephone Encounter (Signed)
Spoke with patient, he did not change insurance, this has been an issue before but patient states that insurance did not cover the 225 mg or the lower mg when this came up in the past. Will call insurance and see what they are covering and what we need to do, pending phone call with them-aa

## 2015-06-11 NOTE — Telephone Encounter (Signed)
Spoke with representative, Venlafaxine 75 mg 2 capsules daily approved for the quantity limit from 03/13/15-06/10/16. Pt advised on his voicemail-aa

## 2015-06-21 DIAGNOSIS — Z23 Encounter for immunization: Secondary | ICD-10-CM | POA: Diagnosis not present

## 2015-08-13 ENCOUNTER — Other Ambulatory Visit: Payer: Self-pay

## 2015-08-13 DIAGNOSIS — F329 Major depressive disorder, single episode, unspecified: Secondary | ICD-10-CM

## 2015-08-13 DIAGNOSIS — F32A Depression, unspecified: Secondary | ICD-10-CM

## 2015-08-13 MED ORDER — VENLAFAXINE HCL ER 75 MG PO CP24: 225.0000 mg | ORAL_CAPSULE | Freq: Every day | ORAL | Status: DC

## 2015-08-13 MED ORDER — METOPROLOL SUCCINATE ER 50 MG PO TB24: ORAL_TABLET | ORAL | Status: DC

## 2015-08-13 MED ORDER — BUPROPION HCL ER (XL) 300 MG PO TB24: 300.0000 mg | ORAL_TABLET | Freq: Every day | ORAL | Status: DC

## 2015-09-09 ENCOUNTER — Other Ambulatory Visit: Payer: Self-pay | Admitting: Family Medicine

## 2015-09-17 DIAGNOSIS — I499 Cardiac arrhythmia, unspecified: Secondary | ICD-10-CM | POA: Insufficient documentation

## 2015-09-17 DIAGNOSIS — R339 Retention of urine, unspecified: Secondary | ICD-10-CM | POA: Diagnosis not present

## 2015-09-17 DIAGNOSIS — R972 Elevated prostate specific antigen [PSA]: Secondary | ICD-10-CM | POA: Diagnosis not present

## 2015-09-17 DIAGNOSIS — N401 Enlarged prostate with lower urinary tract symptoms: Secondary | ICD-10-CM | POA: Diagnosis not present

## 2015-09-17 DIAGNOSIS — N138 Other obstructive and reflux uropathy: Secondary | ICD-10-CM | POA: Diagnosis not present

## 2015-09-18 DIAGNOSIS — I4892 Unspecified atrial flutter: Secondary | ICD-10-CM | POA: Insufficient documentation

## 2015-09-18 DIAGNOSIS — I10 Essential (primary) hypertension: Secondary | ICD-10-CM | POA: Diagnosis not present

## 2015-09-25 DIAGNOSIS — I483 Typical atrial flutter: Secondary | ICD-10-CM | POA: Diagnosis not present

## 2015-09-26 DIAGNOSIS — I4892 Unspecified atrial flutter: Secondary | ICD-10-CM | POA: Diagnosis not present

## 2015-10-01 DIAGNOSIS — Z01818 Encounter for other preprocedural examination: Secondary | ICD-10-CM | POA: Diagnosis not present

## 2015-10-01 DIAGNOSIS — I420 Dilated cardiomyopathy: Secondary | ICD-10-CM | POA: Diagnosis not present

## 2015-10-01 DIAGNOSIS — I10 Essential (primary) hypertension: Secondary | ICD-10-CM | POA: Diagnosis not present

## 2015-10-01 DIAGNOSIS — I4892 Unspecified atrial flutter: Secondary | ICD-10-CM | POA: Diagnosis not present

## 2015-10-09 ENCOUNTER — Ambulatory Visit: Payer: Medicare Other | Admitting: Anesthesiology

## 2015-10-09 ENCOUNTER — Encounter: Payer: Self-pay | Admitting: *Deleted

## 2015-10-09 ENCOUNTER — Encounter
Admission: RE | Disposition: A | Discharge status: Home / Self Care | Payer: Self-pay | Source: Ambulatory Visit | Attending: Cardiology

## 2015-10-09 ENCOUNTER — Ambulatory Visit
Admission: RE | Admit: 2015-10-09 | Discharge: 2015-10-09 | Disposition: A | Discharge status: Home / Self Care | Payer: Medicare Other | Source: Ambulatory Visit | Attending: Cardiology | Admitting: Cardiology

## 2015-10-09 DIAGNOSIS — Z79899 Other long term (current) drug therapy: Secondary | ICD-10-CM | POA: Insufficient documentation

## 2015-10-09 DIAGNOSIS — Z882 Allergy status to sulfonamides status: Secondary | ICD-10-CM | POA: Insufficient documentation

## 2015-10-09 DIAGNOSIS — I4892 Unspecified atrial flutter: Secondary | ICD-10-CM | POA: Diagnosis not present

## 2015-10-09 DIAGNOSIS — I10 Essential (primary) hypertension: Secondary | ICD-10-CM | POA: Diagnosis not present

## 2015-10-09 DIAGNOSIS — Z8249 Family history of ischemic heart disease and other diseases of the circulatory system: Secondary | ICD-10-CM | POA: Insufficient documentation

## 2015-10-09 DIAGNOSIS — I4581 Long QT syndrome: Secondary | ICD-10-CM | POA: Insufficient documentation

## 2015-10-09 DIAGNOSIS — Z87891 Personal history of nicotine dependence: Secondary | ICD-10-CM | POA: Insufficient documentation

## 2015-10-09 DIAGNOSIS — I471 Supraventricular tachycardia: Secondary | ICD-10-CM | POA: Insufficient documentation

## 2015-10-09 DIAGNOSIS — I420 Dilated cardiomyopathy: Secondary | ICD-10-CM | POA: Insufficient documentation

## 2015-10-09 DIAGNOSIS — I4891 Unspecified atrial fibrillation: Secondary | ICD-10-CM | POA: Diagnosis not present

## 2015-10-09 DIAGNOSIS — D649 Anemia, unspecified: Secondary | ICD-10-CM | POA: Diagnosis not present

## 2015-10-09 DIAGNOSIS — K219 Gastro-esophageal reflux disease without esophagitis: Secondary | ICD-10-CM | POA: Diagnosis not present

## 2015-10-09 DIAGNOSIS — Z01810 Encounter for preprocedural cardiovascular examination: Secondary | ICD-10-CM | POA: Diagnosis not present

## 2015-10-09 HISTORY — DX: Benign prostatic hyperplasia without lower urinary tract symptoms: N40.0

## 2015-10-09 HISTORY — DX: Essential (primary) hypertension: I10

## 2015-10-09 HISTORY — PX: ELECTROPHYSIOLOGIC STUDY: SHX172A

## 2015-10-09 HISTORY — DX: Gastro-esophageal reflux disease without esophagitis: K21.9

## 2015-10-09 SURGERY — CARDIOVERSION (CATH LAB)
Anesthesia: General

## 2015-10-09 MED ORDER — SODIUM CHLORIDE 0.9 % IV SOLN
INTRAVENOUS | Status: DC | PRN
  Administered 2015-10-09: 07:00:00 via INTRAVENOUS

## 2015-10-09 MED ORDER — PROPOFOL 10 MG/ML IV BOLUS
INTRAVENOUS | Status: DC | PRN
  Administered 2015-10-09 (×2): 50 mg via INTRAVENOUS
  Administered 2015-10-09: 20 mg via INTRAVENOUS

## 2015-10-09 MED ORDER — SODIUM CHLORIDE 0.9 % IV SOLN: INTRAVENOUS | Status: DC

## 2015-10-09 NOTE — Anesthesia Postprocedure Evaluation (Signed)
Anesthesia Post Note  Patient: Jeffrey French  Procedure(s) Performed: Procedure(s) (LRB): Cardioversion (N/A)  Patient location during evaluation: Cath Lab Anesthesia Type: General Level of consciousness: awake and alert Pain management: pain level controlled Vital Signs Assessment: post-procedure vital signs reviewed and stable Respiratory status: spontaneous breathing and respiratory function stable Cardiovascular status: stable Anesthetic complications: no    Last Vitals:  Filed Vitals:   10/09/15 0734 10/09/15 0800  BP: 119/75 114/49  Pulse: 81 85  Temp:    Resp: 21 17    Last Pain: There were no vitals filed for this visit.               Legacy Carrender K

## 2015-10-09 NOTE — Op Note (Signed)
[facility] Cardiology   10/09/2015                     7:41 AM  PATIENT:  Jeffrey French    PRE-OPERATIVE DIAGNOSIS:  Afib  POST-OPERATIVE DIAGNOSIS:  Same  PROCEDURE:  Cardioversion  SURGEON:  Jerzie Bieri, MD    ANESTHESIA:     PREOPERATIVE INDICATIONS:  Jeffrey French is a  65 y.o. male with a diagnosis of Afib who failed conservative measures and elected for surgical management.    The risks benefits and alternatives were discussed with the patient preoperatively including but not limited to the risks of infection, bleeding, cardiopulmonary complications, the need for revision surgery, among others, and the patient was willing to proceed.   OPERATIVE PROCEDURE:   The patient was brought to the special holding area in a fasting state. The patient received 120 mg of propofol. He underwent a scan consecutive electrical cardioversions at 50 J, 100 J and 150 J with successful conversion to sinus rhythm. There were no periprocedural complications.

## 2015-10-09 NOTE — Anesthesia Preprocedure Evaluation (Signed)
Anesthesia Evaluation  Patient identified by MRN, date of birth, ID band Patient awake    Reviewed: Allergy & Precautions, NPO status , Patient's Chart, lab work & pertinent test results  History of Anesthesia Complications Negative for: history of anesthetic complications  Airway Mallampati: III       Dental   Pulmonary neg pulmonary ROS, former smoker,           Cardiovascular hypertension, Pt. on medications and Pt. on home beta blockers + dysrhythmias Atrial Fibrillation      Neuro/Psych Depression negative neurological ROS     GI/Hepatic PUD, GERD  Medicated and Controlled,  Endo/Other  negative endocrine ROS  Renal/GU negative Renal ROS     Musculoskeletal   Abdominal   Peds  Hematology  (+) anemia ,   Anesthesia Other Findings   Reproductive/Obstetrics                             Anesthesia Physical Anesthesia Plan  ASA: II  Anesthesia Plan: General   Post-op Pain Management:    Induction: Intravenous  Airway Management Planned: Nasal Cannula  Additional Equipment:   Intra-op Plan:   Post-operative Plan:   Informed Consent: I have reviewed the patients History and Physical, chart, labs and discussed the procedure including the risks, benefits and alternatives for the proposed anesthesia with the patient or authorized representative who has indicated his/her understanding and acceptance.     Plan Discussed with:   Anesthesia Plan Comments:         Anesthesia Quick Evaluation

## 2015-10-09 NOTE — Transfer of Care (Signed)
Immediate Anesthesia Transfer of Care Note  Patient: Jeffrey French  Procedure(s) Performed: Procedure(s): Cardioversion (N/A)  Patient Location: PACU  Anesthesia Type:General  Level of Consciousness: awake  Airway & Oxygen Therapy: Patient connected to nasal cannula oxygen  Post-op Assessment: Post -op Vital signs reviewed and stable  Post vital signs: stable  Last Vitals:  Filed Vitals:   10/09/15 0722 10/09/15 0734  BP: 130/93 119/75  Pulse: 121 81  Temp: 36.7 C   Resp: 14 21    Last Pain: There were no vitals filed for this visit.       Complications: No apparent anesthesia complications

## 2015-10-21 DIAGNOSIS — I10 Essential (primary) hypertension: Secondary | ICD-10-CM | POA: Diagnosis not present

## 2015-10-21 DIAGNOSIS — I480 Paroxysmal atrial fibrillation: Secondary | ICD-10-CM | POA: Diagnosis not present

## 2015-10-21 DIAGNOSIS — I4892 Unspecified atrial flutter: Secondary | ICD-10-CM | POA: Diagnosis not present

## 2015-10-21 DIAGNOSIS — I420 Dilated cardiomyopathy: Secondary | ICD-10-CM | POA: Diagnosis not present

## 2015-12-27 ENCOUNTER — Other Ambulatory Visit: Payer: Self-pay | Admitting: Family Medicine

## 2015-12-27 MED ORDER — OMEPRAZOLE 20 MG PO CPDR: 20.0000 mg | DELAYED_RELEASE_CAPSULE | Freq: Every day | ORAL | 2 refills | Status: DC

## 2015-12-27 MED ORDER — TAMSULOSIN HCL 0.4 MG PO CAPS: 0.4000 mg | ORAL_CAPSULE | Freq: Every day | ORAL | 3 refills | Status: DC

## 2015-12-27 NOTE — Telephone Encounter (Signed)
Please review. Thanks!  

## 2015-12-27 NOTE — Telephone Encounter (Signed)
Pt would like a refill sent to a new [facility].  Pt requesting refill of omeprazole and flomax.

## 2015-12-28 MED ORDER — OMEPRAZOLE 20 MG PO CPDR: 20.0000 mg | DELAYED_RELEASE_CAPSULE | Freq: Every day | ORAL | 3 refills | Status: DC

## 2015-12-28 MED ORDER — TAMSULOSIN HCL 0.4 MG PO CAPS: 0.4000 mg | ORAL_CAPSULE | Freq: Every day | ORAL | 3 refills | Status: DC

## 2015-12-28 NOTE — Addendum Note (Signed)
Addended by: Arnette Norris on: 12/28/2015 09:43 AM   Modules accepted: Orders

## 2015-12-28 NOTE — Telephone Encounter (Signed)
2 months--I need to see this fall.

## 2015-12-28 NOTE — Telephone Encounter (Signed)
RXs sent in, [facility] to let patient know about this and to make appt-aa

## 2016-01-21 DIAGNOSIS — I420 Dilated cardiomyopathy: Secondary | ICD-10-CM | POA: Diagnosis not present

## 2016-01-21 DIAGNOSIS — I4892 Unspecified atrial flutter: Secondary | ICD-10-CM | POA: Diagnosis not present

## 2016-01-21 DIAGNOSIS — I10 Essential (primary) hypertension: Secondary | ICD-10-CM | POA: Diagnosis not present

## 2016-02-17 DIAGNOSIS — M7702 Medial epicondylitis, left elbow: Secondary | ICD-10-CM | POA: Diagnosis not present

## 2016-03-17 ENCOUNTER — Other Ambulatory Visit: Payer: Self-pay | Admitting: Family Medicine

## 2016-03-17 DIAGNOSIS — F329 Major depressive disorder, single episode, unspecified: Secondary | ICD-10-CM

## 2016-03-17 DIAGNOSIS — F32A Depression, unspecified: Secondary | ICD-10-CM

## 2016-04-02 ENCOUNTER — Telehealth: Payer: Self-pay | Admitting: Family Medicine

## 2016-04-02 NOTE — Telephone Encounter (Signed)
Called Pt to schedule AWV with [facility] - knb °

## 2016-04-28 ENCOUNTER — Telehealth: Payer: Self-pay

## 2016-04-28 ENCOUNTER — Ambulatory Visit (INDEPENDENT_AMBULATORY_CARE_PROVIDER_SITE_OTHER): Payer: Medicare Other | Admitting: Family Medicine

## 2016-04-28 VITALS — BP 152/60 | HR 76 | Temp 98.1°F | Resp 16 | Ht 75.5 in | Wt 240.0 lb

## 2016-04-28 DIAGNOSIS — F329 Major depressive disorder, single episode, unspecified: Secondary | ICD-10-CM | POA: Diagnosis not present

## 2016-04-28 DIAGNOSIS — I10 Essential (primary) hypertension: Secondary | ICD-10-CM | POA: Diagnosis not present

## 2016-04-28 DIAGNOSIS — M8949 Other hypertrophic osteoarthropathy, multiple sites: Secondary | ICD-10-CM

## 2016-04-28 DIAGNOSIS — E785 Hyperlipidemia, unspecified: Secondary | ICD-10-CM | POA: Diagnosis not present

## 2016-04-28 DIAGNOSIS — M159 Polyosteoarthritis, unspecified: Secondary | ICD-10-CM

## 2016-04-28 DIAGNOSIS — K279 Peptic ulcer, site unspecified, unspecified as acute or chronic, without hemorrhage or perforation: Secondary | ICD-10-CM

## 2016-04-28 DIAGNOSIS — F32A Depression, unspecified: Secondary | ICD-10-CM

## 2016-04-28 DIAGNOSIS — R454 Irritability and anger: Secondary | ICD-10-CM | POA: Diagnosis not present

## 2016-04-28 DIAGNOSIS — M150 Primary generalized (osteo)arthritis: Secondary | ICD-10-CM | POA: Diagnosis not present

## 2016-04-28 MED ORDER — VENLAFAXINE HCL ER 75 MG PO CP24: 75.0000 mg | ORAL_CAPSULE | Freq: Three times a day (TID) | ORAL | 3 refills | Status: DC

## 2016-04-28 MED ORDER — AMLODIPINE BESYLATE 5 MG PO TABS: 5.0000 mg | ORAL_TABLET | Freq: Every day | ORAL | 3 refills | Status: DC

## 2016-04-28 MED ORDER — ARIPIPRAZOLE 2 MG PO TABS: 2.0000 mg | ORAL_TABLET | Freq: Every day | ORAL | 5 refills | Status: DC

## 2016-04-28 MED ORDER — OMEPRAZOLE 20 MG PO CPDR: 20.0000 mg | DELAYED_RELEASE_CAPSULE | Freq: Every day | ORAL | 3 refills | Status: DC

## 2016-04-28 MED ORDER — BUPROPION HCL ER (XL) 300 MG PO TB24: 300.0000 mg | ORAL_TABLET | Freq: Every day | ORAL | 3 refills | Status: DC

## 2016-04-28 NOTE — Telephone Encounter (Signed)
Anderson Malta with [facility] [facility] called requesting clarification of pt's Effexor. Previously was prescribed as three tablets once daily, and is now prescribed as 1 tablet TID. CB# [phone]. Renaldo Fiddler, CMA

## 2016-04-28 NOTE — Telephone Encounter (Signed)
Corrected  ED

## 2016-04-28 NOTE — Progress Notes (Signed)
Patient: Jeffrey French, Male    DOB: 01-Oct-1950, 66 y.o.   MRN: [id number] Visit Date: 04/28/2016  Today's Provider: [name], MD   Chief Complaint  Patient presents with  . Annual Exam   Subjective:   Jeffrey French is a 66 y.o. male who presents today for his Subsequent Annual Wellness Visit. He feels fairly well. He reports exercising none. He reports he is sleeping fairly well. He is married father of 3. No grandchildren. He retired in April 2017 Patient declines all vaccines are recommended today. Labs are requested and ordered. DRE as per Dr. cope who also does his PSA.  Immunization History  Administered Date(s) Administered  . Hepatitis A 11/29/2009  . Tdap 11/29/2009  . Zoster 01/19/2013    11/21/13 Colonoscopy-sessile serrated adenoma/polyp  Review of Systems  Constitutional: Negative.   HENT: Negative.   Eyes: Negative.   Respiratory: Negative.   Cardiovascular: Negative.   Gastrointestinal: Negative.   Genitourinary: Negative.   Musculoskeletal: Positive for arthralgias and joint swelling.  Skin: Positive for rash.  Allergic/Immunologic: Negative.   Neurological: Negative.   Hematological: Negative.   Psychiatric/Behavioral: Negative.     Patient Active Problem List   Diagnosis Date Noted  . Absolute anemia 02/18/2015  . Benign fibroma of prostate 02/18/2015  . Clinical depression 02/18/2015  . Abnormal prostate specific antigen 02/18/2015  . Essential (primary) hypertension 02/18/2015  . Anxiety, generalized 02/18/2015  . Cannot sleep 02/18/2015  . Arthritis, degenerative 02/18/2015  . Peptic ulcer 02/18/2015  . Elevated prostate specific antigen (PSA) 10/19/2013    Social History   Social History  . Marital status: Married    Spouse name: N/A  . Number of children: N/A  . Years of education: N/A   Occupational History  . Not on file.   Social History Main Topics  . Smoking status: Former Research scientist (life sciences)  . Smokeless tobacco: Not on file      Comment: 1974  . Alcohol use Yes     Comment: occasionally  . Drug use: No  . Sexual activity: Not on file   Other Topics Concern  . Not on file   Social History Narrative  . No narrative on file    Past Surgical History:  Procedure Laterality Date  . ELECTROPHYSIOLOGIC STUDY N/A 10/09/2015   Procedure: Cardioversion;  Surgeon: Isaias Cowman, MD;  Location: [facility] ORS;  Service: Cardiovascular;  Laterality: N/A;  . NASAL RECONSTRUCTION WITH SEPTAL REPAIR    . REPLACEMENT TOTAL KNEE Right   . SHOULDER SURGERY Left   . TONSILLECTOMY      His family history includes Arthritis in his mother and sister; Colon cancer in his mother; Congestive Heart Failure in his father; Diabetes in his father; Hypertension in his mother; Other in his mother.     Outpatient Encounter Prescriptions as of 04/28/2016  Medication Sig Note  . amLODipine (NORVASC) 5 MG tablet TAKE 1 TABLET BY MOUTH DAILY   . apixaban (ELIQUIS) 5 MG TABS tablet Take 5 mg by mouth 2 (two) times daily.   Marland Kitchen buPROPion (WELLBUTRIN XL) 300 MG 24 hr tablet Take 1 tablet (300 mg total) by mouth daily.   . Cholecalciferol (VITAMIN D) 2000 UNITS CAPS Take 1 capsule by mouth daily.  02/18/2015: Received from: [facility]  . finasteride (PROSCAR) 5 MG tablet Take 5 mg by mouth daily.   . folic acid (FOLVITE) Q000111Q MCG tablet Take 400 mcg by mouth daily.  02/18/2015: Received from: [facility]  .  Melatonin 1 MG TABS Take 1 tablet by mouth at bedtime.  02/18/2015: Received from: [facility]  . metoprolol succinate (TOPROL-XL) 50 MG 24 hr tablet 1 tablet daily (Patient taking differently: Take 50 mg by mouth daily. )   . omeprazole (PRILOSEC) 20 MG capsule Take 1 capsule (20 mg total) by mouth daily.   . tamsulosin (FLOMAX) 0.4 MG CAPS capsule Take 1 capsule (0.4 mg total) by mouth daily.   Marland Kitchen triamcinolone cream (KENALOG) 0.1 % Apply 1 application topically 2 (two) times daily.   Marland Kitchen  venlafaxine XR (EFFEXOR-XR) 75 MG 24 hr capsule TAKE 3 CAPSULES BY MOUTH DAILY WITH BREAKFAST.   Marland Kitchen alprazolam (XANAX) 2 MG tablet Take 1 mg by mouth as needed.  02/18/2015: Medication taken as needed.  Received from: [facility]  . temazepam (RESTORIL) 30 MG capsule Take 30 mg by mouth daily.  02/18/2015: Medication taken as needed.  Received from: [facility]  . [DISCONTINUED] amoxicillin-clavulanate (AUGMENTIN) 875-125 MG tablet Take 1 tablet by mouth 2 (two) times daily. (Patient not taking: Reported on 10/02/2015)   . [DISCONTINUED] rOPINIRole (REQUIP) 2 MG tablet Take 1 tablet (2 mg total) by mouth at bedtime.    No facility-administered encounter medications on file as of 04/28/2016.     Allergies  Allergen Reactions  . Sulfamethoxazole-Trimethoprim     Patient Care Team: Jerrol Banana., MD as PCP - General (Family Medicine)   Objective:   Vitals:  Vitals:   04/28/16 0927  BP: (!) 152/60  Pulse: 76  Resp: 16  Temp: 98.1 F (36.7 C)  TempSrc: Oral  Weight: 240 lb (108.9 kg)  Height: 6' 3.5" (1.918 m)    Physical Exam  Constitutional: He is oriented to person, place, and time. He appears well-developed and well-nourished.  HENT:  Head: Normocephalic and atraumatic.  Right Ear: External ear normal.  Left Ear: External ear normal.  Nose: Nose normal.  Mouth/Throat: Oropharynx is clear and moist.  Eyes: Conjunctivae and EOM are normal. Pupils are equal, round, and reactive to light.  Neck: Normal range of motion. Neck supple.  Cardiovascular: Normal rate, regular rhythm, normal heart sounds and intact distal pulses.   Pulmonary/Chest: Effort normal and breath sounds normal.  Abdominal: Soft. Bowel sounds are normal.  Musculoskeletal: Normal range of motion.  Neurological: He is alert and oriented to person, place, and time.  Skin: Skin is warm and dry.  Psychiatric: He has a normal mood and affect. His behavior is normal.  Judgment and thought content normal.    Activities of Daily Living In your present state of health, do you have any difficulty performing the following activities: 04/28/2016  Hearing? N  Vision? N  Difficulty concentrating or making decisions? Y  Walking or climbing stairs? N  Dressing or bathing? N  Doing errands, shopping? N  Some recent data might be hidden    Fall Risk Assessment Fall Risk  04/28/2016 02/20/2015  Falls in the past year? Yes No  Number falls in past yr: 2 or more -  Injury with Fall? No -     Depression Screen PHQ 2/9 Scores 04/28/2016 02/20/2015  PHQ - 2 Score 0 0    Cognitive Testing - 6-CIT    Year: 0 4 points  Month: 0 3 points  Memorize "Pia Mau, [address], [place]"  Time (within 1 hour:) 0 3 points  Count backwards from 20: 0 2 4 points  Name months of year: 0 2 4  points  Repeat Address: 0 2 4 6 8 10  points   Total Score: 2/28  Interpretation : Normal (0-7) Abnormal (8-28)    Assessment & Plan:     Annual Wellness Visit  Reviewed patient's Family Medical History Reviewed and updated list of patient's medical providers Assessment of cognitive impairment was done Assessed patient's functional ability Established a written schedule for health screening [place] Completed and Reviewed  Exercise Activities and Dietary recommendations Goals    None      Immunization History  Administered Date(s) Administered  . Hepatitis A 11/29/2009  . Tdap 11/29/2009  . Zoster 01/19/2013    Health Maintenance  Topic Date Due  . PNA vac Low Risk Adult (1 of 2 - PCV13) 03/10/2014  . INFLUENZA VACCINE  11/05/2015  . TETANUS/TDAP  11/30/2019  . COLONOSCOPY  11/22/2023  . ZOSTAVAX  Completed  . Hepatitis C Screening  Completed     Discussed health benefits of physical activity, and encouraged him to engage in regular exercise appropriate for his age and condition.    1. Essential (primary) hypertension  - CBC  with Differential/Platelet - Comprehensive metabolic panel - TSH - amLODipine (NORVASC) 5 MG tablet; Take 1 tablet (5 mg total) by mouth daily.  Dispense: 90 tablet; Refill: 3  2. Hyperlipidemia, unspecified hyperlipidemia type  - Lipid Panel With LDL/HDL Ratio  3. Depression, unspecified depression type/Mood disorder Consider using Abilify 2 mg although 5 mg dose in this size man is probably more appropriate. Psychiatric referral offered but patient declines  - buPROPion (WELLBUTRIN XL) 300 MG 24 hr tablet; Take 1 tablet (300 mg total) by mouth daily.  Dispense: 90 tablet; Refill: 3 - venlafaxine XR (EFFEXOR-XR) 75 MG 24 hr capsule; Take 1 capsule (75 mg total) by mouth 3 (three) times daily.  Dispense: 270 capsule; Refill: 3  4. Peptic ulcer  - omeprazole (PRILOSEC) 20 MG capsule; Take 1 capsule (20 mg total) by mouth daily.  Dispense: 90 capsule; Refill: 3 5. BPH/elevated PSA Per urology 6. Eczema  HPI, Exam and A&P Transcribed under the direction and in the presence of [name]., MD. Electronically Signed: Althea Charon, [place]

## 2016-05-13 DIAGNOSIS — I420 Dilated cardiomyopathy: Secondary | ICD-10-CM | POA: Diagnosis not present

## 2016-05-13 DIAGNOSIS — I10 Essential (primary) hypertension: Secondary | ICD-10-CM | POA: Diagnosis not present

## 2016-05-13 DIAGNOSIS — I4892 Unspecified atrial flutter: Secondary | ICD-10-CM | POA: Diagnosis not present

## 2016-05-28 ENCOUNTER — Ambulatory Visit (INDEPENDENT_AMBULATORY_CARE_PROVIDER_SITE_OTHER): Payer: Medicare Other | Admitting: Family Medicine

## 2016-05-28 VITALS — BP 128/62 | HR 82 | Resp 14 | Wt 241.0 lb

## 2016-05-28 DIAGNOSIS — G4709 Other insomnia: Secondary | ICD-10-CM | POA: Diagnosis not present

## 2016-05-28 DIAGNOSIS — F411 Generalized anxiety disorder: Secondary | ICD-10-CM | POA: Diagnosis not present

## 2016-05-28 DIAGNOSIS — I10 Essential (primary) hypertension: Secondary | ICD-10-CM

## 2016-05-28 DIAGNOSIS — F329 Major depressive disorder, single episode, unspecified: Secondary | ICD-10-CM

## 2016-05-28 DIAGNOSIS — F32A Depression, unspecified: Secondary | ICD-10-CM

## 2016-05-28 MED ORDER — TEMAZEPAM 30 MG PO CAPS: 30.0000 mg | ORAL_CAPSULE | Freq: Every day | ORAL | 5 refills | Status: DC

## 2016-05-28 NOTE — Progress Notes (Signed)
Jeffrey French  MRN: [id number] DOB: 12-24-1950  Subjective:  HPI  Patient is here for 1 month follow up after starting Abilify 2 mg. He feels 90% better. No side effect from the medication unless having trouble with sleeping can be one of the issues. He has had this issue since starting the medication and wanted to discuss getting Restoril prescription again, he used to be on this years ago. Irritability much better per pt.  Patient Active Problem List   Diagnosis Date Noted  . Absolute anemia 02/18/2015  . Benign fibroma of prostate 02/18/2015  . Clinical depression 02/18/2015  . Abnormal prostate specific antigen 02/18/2015  . Essential (primary) hypertension 02/18/2015  . Anxiety, generalized 02/18/2015  . Cannot sleep 02/18/2015  . Arthritis, degenerative 02/18/2015  . Peptic ulcer 02/18/2015  . Elevated prostate specific antigen (PSA) 10/19/2013    Past Medical History:  Diagnosis Date  . BPH (benign prostatic hyperplasia)   . GERD (gastroesophageal reflux disease)   . Hypertension     Social History   Social History  . Marital status: Married    Spouse name: N/A  . Number of children: N/A  . Years of education: N/A   Occupational History  . Not on file.   Social History Main Topics  . Smoking status: Former Research scientist (life sciences)  . Smokeless tobacco: Not on file     Comment: 1974  . Alcohol use Yes     Comment: occasionally  . Drug use: No  . Sexual activity: Not on file   Other Topics Concern  . Not on file   Social History Narrative  . No narrative on file    Outpatient Encounter Prescriptions as of 05/28/2016  Medication Sig Note  . alprazolam (XANAX) 2 MG tablet Take 1 mg by mouth as needed.  02/18/2015: Medication taken as needed.  Received from: [facility]  . amLODipine (NORVASC) 5 MG tablet Take 1 tablet (5 mg total) by mouth daily.   Marland Kitchen apixaban (ELIQUIS) 5 MG TABS tablet Take 5 mg by mouth 2 (two) times daily.   . ARIPiprazole  (ABILIFY) 2 MG tablet Take 1 tablet (2 mg total) by mouth daily.   Marland Kitchen buPROPion (WELLBUTRIN XL) 300 MG 24 hr tablet Take 1 tablet (300 mg total) by mouth daily.   . Cholecalciferol (VITAMIN D) 2000 UNITS CAPS Take 1 capsule by mouth daily.  02/18/2015: Received from: [facility]  . finasteride (PROSCAR) 5 MG tablet Take 5 mg by mouth daily.   . folic acid (FOLVITE) Q000111Q MCG tablet Take 400 mcg by mouth daily.  02/18/2015: Received from: [facility]  . Melatonin 1 MG TABS Take 1 tablet by mouth at bedtime.  02/18/2015: Received from: [facility]  . metoprolol succinate (TOPROL-XL) 50 MG 24 hr tablet 1 tablet daily (Patient taking differently: Take 50 mg by mouth daily. )   . omeprazole (PRILOSEC) 20 MG capsule Take 1 capsule (20 mg total) by mouth daily.   . tamsulosin (FLOMAX) 0.4 MG CAPS capsule Take 1 capsule (0.4 mg total) by mouth daily.   . temazepam (RESTORIL) 30 MG capsule Take 30 mg by mouth daily.  02/18/2015: Medication taken as needed.  Received from: [facility]  . triamcinolone cream (KENALOG) 0.1 % Apply 1 application topically 2 (two) times daily.   Marland Kitchen venlafaxine XR (EFFEXOR-XR) 75 MG 24 hr capsule Take 1 capsule (75 mg total) by mouth 3 (three) times daily.    No facility-administered encounter medications  on file as of 05/28/2016.     Allergies  Allergen Reactions  . Sulfamethoxazole-Trimethoprim     Review of Systems  Constitutional: Negative.   Eyes: Negative.   Respiratory: Negative.   Cardiovascular: Negative.   Musculoskeletal: Positive for joint pain (left elbow pain, patient is seen orthopedic for this issue).  Skin: Negative.   Neurological: Negative.   Endo/Heme/Allergies: Negative.   Psychiatric/Behavioral: Positive for depression (better). The patient has insomnia.     Objective:  BP 128/62   Pulse 82   Resp 14   Wt 241 lb (109.3 kg)   BMI 29.73 kg/m   Physical Exam    Constitutional: He is oriented to person, place, and time and well-developed, well-nourished, and in no distress.  HENT:  Head: Normocephalic and atraumatic.  Right Ear: External ear normal.  Left Ear: External ear normal.  Nose: Nose normal.  Eyes: Conjunctivae are normal. Pupils are equal, round, and reactive to light.  Neck: Normal range of motion. Neck supple.  Cardiovascular: Normal rate, regular rhythm, normal heart sounds and intact distal pulses.   No murmur heard. Pulmonary/Chest: Effort normal and breath sounds normal. No respiratory distress. He has no wheezes.  Abdominal: Soft.  Neurological: He is alert and oriented to person, place, and time.  Skin: Skin is warm and dry.  Psychiatric: Mood, memory, affect and judgment normal.    Assessment and Plan :  1. Depression, unspecified depression type 90% better. Continue current medication/regimen.  2. Essential (primary) hypertension  3. Other insomnia Refill Temazepam. Discussed possible side effects of this medication with his age and long term use of the medication.  4. Anxiety, generalized Stable.  HPI, Exam and A&P transcribed under direction and in the presence of Miguel Aschoff, MD. I have done the exam and reviewed the chart and it is accurate to the best of my knowledge. Development worker, community has been used and  any errors in dictation or transcription are unintentional. Miguel Aschoff M.D. [facility]

## 2016-05-29 DIAGNOSIS — M7702 Medial epicondylitis, left elbow: Secondary | ICD-10-CM | POA: Diagnosis not present

## 2016-06-18 ENCOUNTER — Other Ambulatory Visit: Payer: Self-pay | Admitting: Orthopedic Surgery

## 2016-06-18 DIAGNOSIS — M25522 Pain in left elbow: Secondary | ICD-10-CM

## 2016-06-24 ENCOUNTER — Other Ambulatory Visit: Payer: Self-pay | Admitting: Orthopedic Surgery

## 2016-06-24 DIAGNOSIS — Z1389 Encounter for screening for other disorder: Secondary | ICD-10-CM

## 2016-06-29 ENCOUNTER — Ambulatory Visit
Admission: RE | Admit: 2016-06-29 | Discharge: 2016-06-29 | Disposition: A | Discharge status: Home / Self Care | Payer: Medicare Other | Source: Ambulatory Visit | Attending: Orthopedic Surgery | Admitting: Orthopedic Surgery

## 2016-06-29 DIAGNOSIS — S56212A Strain of other flexor muscle, fascia and tendon at forearm level, left arm, initial encounter: Secondary | ICD-10-CM | POA: Insufficient documentation

## 2016-06-29 DIAGNOSIS — X58XXXA Exposure to other specified factors, initial encounter: Secondary | ICD-10-CM | POA: Insufficient documentation

## 2016-06-29 DIAGNOSIS — M7702 Medial epicondylitis, left elbow: Secondary | ICD-10-CM | POA: Insufficient documentation

## 2016-06-29 DIAGNOSIS — M25522 Pain in left elbow: Secondary | ICD-10-CM | POA: Insufficient documentation

## 2016-06-29 DIAGNOSIS — M7712 Lateral epicondylitis, left elbow: Secondary | ICD-10-CM | POA: Diagnosis not present

## 2016-07-02 DIAGNOSIS — M7702 Medial epicondylitis, left elbow: Secondary | ICD-10-CM | POA: Diagnosis not present

## 2016-07-02 DIAGNOSIS — S53442A Ulnar collateral ligament sprain of left elbow, initial encounter: Secondary | ICD-10-CM | POA: Diagnosis not present

## 2016-07-13 DIAGNOSIS — M7702 Medial epicondylitis, left elbow: Secondary | ICD-10-CM | POA: Diagnosis not present

## 2016-07-27 ENCOUNTER — Telehealth: Payer: Self-pay

## 2016-07-27 NOTE — Telephone Encounter (Signed)
lmtcb, patient needs appointment for surgical clearance, form is on desk [id number]

## 2016-07-27 NOTE — Telephone Encounter (Signed)
Pt advised and appointment made-aa 

## 2016-07-28 ENCOUNTER — Ambulatory Visit: Payer: Self-pay | Admitting: Family Medicine

## 2016-08-10 ENCOUNTER — Other Ambulatory Visit: Payer: Self-pay | Admitting: Family Medicine

## 2016-08-10 DIAGNOSIS — K219 Gastro-esophageal reflux disease without esophagitis: Secondary | ICD-10-CM | POA: Diagnosis not present

## 2016-08-10 DIAGNOSIS — Z96659 Presence of unspecified artificial knee joint: Secondary | ICD-10-CM | POA: Diagnosis not present

## 2016-08-10 DIAGNOSIS — G5622 Lesion of ulnar nerve, left upper limb: Secondary | ICD-10-CM | POA: Diagnosis not present

## 2016-08-10 DIAGNOSIS — M7702 Medial epicondylitis, left elbow: Secondary | ICD-10-CM | POA: Diagnosis not present

## 2016-08-10 DIAGNOSIS — Z87891 Personal history of nicotine dependence: Secondary | ICD-10-CM | POA: Diagnosis not present

## 2016-08-10 DIAGNOSIS — I4892 Unspecified atrial flutter: Secondary | ICD-10-CM | POA: Diagnosis not present

## 2016-08-10 DIAGNOSIS — I10 Essential (primary) hypertension: Secondary | ICD-10-CM | POA: Diagnosis not present

## 2016-08-10 DIAGNOSIS — F329 Major depressive disorder, single episode, unspecified: Secondary | ICD-10-CM | POA: Diagnosis not present

## 2016-08-10 DIAGNOSIS — Z7901 Long term (current) use of anticoagulants: Secondary | ICD-10-CM | POA: Diagnosis not present

## 2016-08-19 ENCOUNTER — Telehealth: Payer: Self-pay | Admitting: Emergency Medicine

## 2016-08-19 NOTE — Telephone Encounter (Signed)
Spoke with pts wife. She reports that pt has been having delayed/slow speech, and abnormal gait for a week now. Denies headache, shortness of breath, chest pain, weakness. I talked with Dr. Rosanna Randy about this and pt symptoms and Dr. Rosanna Randy wanted him to be seen today or go to the ER. He refused to see PA or go to the ER. Pt made appt for tomorrow with Dr. Rosanna Randy at 1:30.

## 2016-08-20 ENCOUNTER — Ambulatory Visit: Payer: Self-pay | Admitting: Family Medicine

## 2016-09-11 DIAGNOSIS — I10 Essential (primary) hypertension: Secondary | ICD-10-CM | POA: Diagnosis not present

## 2016-09-11 DIAGNOSIS — I420 Dilated cardiomyopathy: Secondary | ICD-10-CM | POA: Diagnosis not present

## 2016-09-11 DIAGNOSIS — I4892 Unspecified atrial flutter: Secondary | ICD-10-CM | POA: Diagnosis not present

## 2016-11-26 ENCOUNTER — Encounter: Payer: Self-pay | Admitting: Family Medicine

## 2016-11-26 ENCOUNTER — Telehealth: Payer: Self-pay

## 2016-11-26 ENCOUNTER — Other Ambulatory Visit
Admission: RE | Admit: 2016-11-26 | Discharge: 2016-11-26 | Disposition: A | Discharge status: Home / Self Care | Payer: Medicare Other | Source: Ambulatory Visit | Attending: Family Medicine | Admitting: Family Medicine

## 2016-11-26 ENCOUNTER — Ambulatory Visit (INDEPENDENT_AMBULATORY_CARE_PROVIDER_SITE_OTHER): Payer: Medicare Other | Admitting: Family Medicine

## 2016-11-26 VITALS — BP 118/62 | HR 66 | Temp 98.1°F | Resp 14 | Wt 241.8 lb

## 2016-11-26 DIAGNOSIS — G4709 Other insomnia: Secondary | ICD-10-CM | POA: Insufficient documentation

## 2016-11-26 DIAGNOSIS — Z6829 Body mass index (BMI) 29.0-29.9, adult: Secondary | ICD-10-CM

## 2016-11-26 DIAGNOSIS — E785 Hyperlipidemia, unspecified: Secondary | ICD-10-CM | POA: Diagnosis not present

## 2016-11-26 DIAGNOSIS — F411 Generalized anxiety disorder: Secondary | ICD-10-CM | POA: Diagnosis not present

## 2016-11-26 DIAGNOSIS — I10 Essential (primary) hypertension: Secondary | ICD-10-CM | POA: Diagnosis not present

## 2016-11-26 DIAGNOSIS — R972 Elevated prostate specific antigen [PSA]: Secondary | ICD-10-CM | POA: Diagnosis not present

## 2016-11-26 DIAGNOSIS — F3289 Other specified depressive episodes: Secondary | ICD-10-CM | POA: Insufficient documentation

## 2016-11-26 DIAGNOSIS — G3184 Mild cognitive impairment, so stated: Secondary | ICD-10-CM | POA: Diagnosis not present

## 2016-11-26 DIAGNOSIS — Z2821 Immunization not carried out because of patient refusal: Secondary | ICD-10-CM

## 2016-11-26 LAB — CBC WITH DIFFERENTIAL/PLATELET
Basophils Absolute: 0.1 10*3/uL (ref 0–0.1)
Basophils Relative: 1 %
Eosinophils Absolute: 0.6 10*3/uL (ref 0–0.7)
Eosinophils Relative: 9 %
HCT: 39.4 % — ABNORMAL LOW (ref 40.0–52.0)
Hemoglobin: 13.7 g/dL (ref 13.0–18.0)
Lymphocytes Relative: 27 %
Lymphs Abs: 1.8 10*3/uL (ref 1.0–3.6)
MCH: 29 pg (ref 26.0–34.0)
MCHC: 34.7 g/dL (ref 32.0–36.0)
MCV: 83.5 fL (ref 80.0–100.0)
Monocytes Absolute: 0.5 10*3/uL (ref 0.2–1.0)
Monocytes Relative: 8 %
Neutro Abs: 3.7 10*3/uL (ref 1.4–6.5)
Neutrophils Relative %: 55 %
Platelets: 314 10*3/uL (ref 150–440)
RBC: 4.71 MIL/uL (ref 4.40–5.90)
RDW: 14.9 % — ABNORMAL HIGH (ref 11.5–14.5)
WBC: 6.7 10*3/uL (ref 3.8–10.6)

## 2016-11-26 LAB — COMPREHENSIVE METABOLIC PANEL
ALT: 22 U/L (ref 17–63)
AST: 24 U/L (ref 15–41)
Albumin: 3.9 g/dL (ref 3.5–5.0)
Alkaline Phosphatase: 87 U/L (ref 38–126)
Anion gap: 5 (ref 5–15)
BUN: 16 mg/dL (ref 6–20)
CO2: 28 mmol/L (ref 22–32)
Calcium: 9 mg/dL (ref 8.9–10.3)
Chloride: 106 mmol/L (ref 101–111)
Creatinine, Ser: 0.97 mg/dL (ref 0.61–1.24)
GFR calc Af Amer: >60 mL/min (ref >60)
GFR calc non Af Amer: >60 mL/min (ref >60)
Glucose, Bld: 102 mg/dL — ABNORMAL HIGH (ref 65–99)
Potassium: 4.3 mmol/L (ref 3.5–5.1)
Sodium: 139 mmol/L (ref 135–145)
Total Bilirubin: 1.1 mg/dL (ref 0.3–1.2)
Total Protein: 7.1 g/dL (ref 6.5–8.1)

## 2016-11-26 LAB — TSH: TSH: 1.945 u[IU]/mL (ref 0.350–4.500)

## 2016-11-26 LAB — LIPID PANEL
Cholesterol: 190 mg/dL (ref 0–200)
HDL: 48 mg/dL (ref >40)
LDL Cholesterol: 115 mg/dL — ABNORMAL HIGH (ref 0–99)
Total CHOL/HDL Ratio: 4 RATIO
Triglycerides: 133 mg/dL (ref <150)
VLDL: 27 mg/dL (ref 0–40)

## 2016-11-26 LAB — PSA: Prostatic Specific Antigen: 2.66 ng/mL (ref 0.00–4.00)

## 2016-11-26 NOTE — Telephone Encounter (Signed)
Patient advised of lab results. He is waiting on a call back about referral.

## 2016-11-26 NOTE — Telephone Encounter (Signed)
-----   Message from Jerrol Banana., MD sent at 11/26/2016 10:32 AM EDT ----- Labs OK.

## 2016-11-26 NOTE — Progress Notes (Signed)
Jeffrey French  MRN: [id number] DOB: May 20, 1950  Subjective:  HPI  Patient is here for 6 months follow up. Last office visit was on 05/28/16. HTN: patient is not checking his b/p at home. BP Readings from Last 3 Encounters:  11/26/16 118/62  05/28/16 128/62  04/28/16 (!) 152/60   Wt Readings from Last 3 Encounters:  11/26/16 241 lb 12.8 oz (109.7 kg)  05/28/16 241 lb (109.3 kg)  04/28/16 240 lb (108.9 kg)   Depression/Anxiety: patient taking Xanax about 1 time a week. He has stopped Abilify since last visit due to cost of the medication. He is still taking Wellbutrin and Effexor. He feels he is doing well and does not need medication adjustment. Depression screen [facility] 2/9 11/26/2016 04/28/2016 02/20/2015  Decreased Interest 0 0 0  Down, Depressed, Hopeless 0 0 0  PHQ - 2 Score 0 0 0  Altered sleeping 2 - -  Tired, decreased energy 0 - -  Change in appetite 0 - -  Feeling bad or failure about yourself  0 - -  Trouble concentrating 0 - -  Moving slowly or fidgety/restless 0 - -  Suicidal thoughts 0 - -  PHQ-9 Score 2 - -  Difficult doing work/chores Not difficult at all - -   Insomnia: Patient is taking Temazepam as needed, does not recall when the last time he took this medication.  Patient states he is doing well except his memory. He is not having trouble with conversations and memory with that or getting lost driving. Examples of what is been the issue are: 1) laying something down and then turning around and its not where he thought he layed it down and having hard time finding it, 2) went to the store had to get 6 items, he did, when to self check out with the 6 items, came home and only had 4 items and paid for 4 items and realized that he left the other 2 items on the check in counter. MMSE - Mini Mental State Exam 11/26/2016  Orientation to time 4  Orientation to Place 5  Registration 3  Attention/ Calculation 5  Recall 1  Language- name 2 objects 2  Language- repeat 1    Language- follow 3 step command 2  Language- read & follow direction 1  Write a sentence 1  Copy design 1  Total score 26   Re discussed Pneumonia vaccination today again and patient has declined. Patient Active Problem List   Diagnosis Date Noted  . Irregular heart rhythm 09/17/2015  . Absolute anemia 02/18/2015  . Benign fibroma of prostate 02/18/2015  . Clinical depression 02/18/2015  . Abnormal prostate specific antigen 02/18/2015  . Essential (primary) hypertension 02/18/2015  . Anxiety, generalized 02/18/2015  . Cannot sleep 02/18/2015  . Arthritis, degenerative 02/18/2015  . Peptic ulcer 02/18/2015  . Elevated prostate specific antigen (PSA) 10/19/2013    Past Medical History:  Diagnosis Date  . BPH (benign prostatic hyperplasia)   . GERD (gastroesophageal reflux disease)   . Hypertension     Social History   Social History  . Marital status: Married    Spouse name: N/A  . Number of children: N/A  . Years of education: N/A   Occupational History  . Not on file.   Social History Main Topics  . Smoking status: Former Research scientist (life sciences)  . Smokeless tobacco: Not on file     Comment: 1974  . Alcohol use Yes     Comment: occasionally  .  Drug use: No  . Sexual activity: Not on file   Other Topics Concern  . Not on file   Social History Narrative  . No narrative on file    Outpatient Encounter Prescriptions as of 11/26/2016  Medication Sig Note  . alprazolam (XANAX) 2 MG tablet Take 1 mg by mouth as needed.  02/18/2015: Medication taken as needed.  Received from: [facility]  . amLODipine (NORVASC) 5 MG tablet Take 1 tablet (5 mg total) by mouth daily.   Marland Kitchen apixaban (ELIQUIS) 5 MG TABS tablet Take 5 mg by mouth 2 (two) times daily.   Marland Kitchen buPROPion (WELLBUTRIN XL) 300 MG 24 hr tablet Take 1 tablet (300 mg total) by mouth daily.   . Cholecalciferol (VITAMIN D) 2000 UNITS CAPS Take 1 capsule by mouth daily.  02/18/2015: Received from: [facility]  . finasteride (PROSCAR) 5 MG tablet Take 5 mg by mouth daily.   . folic acid (FOLVITE) 478 MCG tablet Take 400 mcg by mouth daily.  02/18/2015: Received from: [facility]  . Melatonin 1 MG TABS Take 1 tablet by mouth at bedtime.  02/18/2015: Received from: [facility]  . metoprolol succinate (TOPROL-XL) 50 MG 24 hr tablet TAKE 1 TABLET BY MOUTH DAILY.   Marland Kitchen omeprazole (PRILOSEC) 20 MG capsule Take 1 capsule (20 mg total) by mouth daily.   . tamsulosin (FLOMAX) 0.4 MG CAPS capsule Take 1 capsule (0.4 mg total) by mouth daily.   . temazepam (RESTORIL) 30 MG capsule Take 1 capsule (30 mg total) by mouth daily.   Marland Kitchen venlafaxine XR (EFFEXOR-XR) 75 MG 24 hr capsule Take 1 capsule (75 mg total) by mouth 3 (three) times daily.   . [DISCONTINUED] ARIPiprazole (ABILIFY) 2 MG tablet Take 1 tablet (2 mg total) by mouth daily.   . [DISCONTINUED] triamcinolone cream (KENALOG) 0.1 % Apply 1 application topically 2 (two) times daily.    No facility-administered encounter medications on file as of 11/26/2016.     Allergies  Allergen Reactions  . Sulfamethoxazole-Trimethoprim     Review of Systems  Constitutional: Negative.   Eyes: Negative.   Respiratory: Negative.   Cardiovascular: Negative.   Gastrointestinal: Negative.   Musculoskeletal: Negative.        Right ring finger catches sometimes. Hands feel tight at times.  Skin: Negative.   Neurological: Negative.   Endo/Heme/Allergies: Negative.   Psychiatric/Behavioral: Positive for depression and memory loss. The patient has insomnia.     Objective:  BP 118/62   Pulse 66   Temp 98.1 F (36.7 C)   Resp 14   Wt 241 lb 12.8 oz (109.7 kg)   BMI 29.82 kg/m   Physical Exam  Constitutional: He is oriented to person, place, and time and well-developed, well-nourished, and in no distress.  HENT:  Head: Normocephalic and atraumatic.  Eyes: Pupils are equal, round, and reactive to light.  Conjunctivae are normal.  Neck: Normal range of motion. Neck supple.  Cardiovascular: Normal rate, regular rhythm, normal heart sounds and intact distal pulses.  Exam reveals no gallop.   No murmur heard. Pulmonary/Chest: Effort normal and breath sounds normal. No respiratory distress. He has no wheezes.  Abdominal: Soft.  Musculoskeletal: He exhibits no edema or tenderness.  Neurological: He is alert and oriented to person, place, and time.  Skin: Skin is warm and dry.  Psychiatric: Mood, affect and judgment normal.   Assessment and Plan :  1. Other depression Stable on current medication. - TSH  2. Essential (primary) hypertension Stable. Continue current medication. - CBC with Differential/Platelet - Comprehensive metabolic panel  3. Other insomnia Stable. - TSH  4. Anxiety, generalized Stable.  5. Hyperlipidemia, unspecified hyperlipidemia type Re ordered lab work again today. - Comprehensive metabolic panel - Lipid Panel With LDL/HDL Ratio  6. Pneumococcal vaccination declined by patient Patient declined again.  7. BMI 29.0-29.9,adult Discussed with patient to work on habits, increase exercise and working on eating changes for the better. Goal is a loose 36 waist pants/'need to work on flatter belly" - TSH  8. MCI (mild cognitive impairment) MMSE today 26/30. Mild. Will refer to neurologist for further work up. - Ambulatory referral to Neurology  HPI, Exam and A&P transcribed by Theressa Millard, RMA under direction and in the presence of Miguel Aschoff, MD. I have done the exam and reviewed the chart and it is accurate to the best of my knowledge. Development worker, community has been used and  any errors in dictation or transcription are unintentional. Miguel Aschoff M.D. [facility]

## 2016-11-26 NOTE — Telephone Encounter (Signed)
Patient is returning Sarah's call regarding Neuro referral. CB# [phone].

## 2017-01-06 DIAGNOSIS — R0683 Snoring: Secondary | ICD-10-CM | POA: Diagnosis not present

## 2017-01-06 DIAGNOSIS — R413 Other amnesia: Secondary | ICD-10-CM | POA: Diagnosis not present

## 2017-01-06 DIAGNOSIS — E538 Deficiency of other specified B group vitamins: Secondary | ICD-10-CM | POA: Diagnosis not present

## 2017-01-06 DIAGNOSIS — E559 Vitamin D deficiency, unspecified: Secondary | ICD-10-CM | POA: Diagnosis not present

## 2017-01-06 DIAGNOSIS — G4719 Other hypersomnia: Secondary | ICD-10-CM | POA: Diagnosis not present

## 2017-01-11 ENCOUNTER — Other Ambulatory Visit: Payer: Self-pay | Admitting: Neurology

## 2017-01-11 DIAGNOSIS — R413 Other amnesia: Secondary | ICD-10-CM

## 2017-01-15 ENCOUNTER — Other Ambulatory Visit (HOSPITAL_COMMUNITY): Payer: Medicare Other

## 2017-01-21 DIAGNOSIS — R413 Other amnesia: Secondary | ICD-10-CM | POA: Diagnosis not present

## 2017-01-25 DIAGNOSIS — H903 Sensorineural hearing loss, bilateral: Secondary | ICD-10-CM | POA: Diagnosis not present

## 2017-01-27 ENCOUNTER — Ambulatory Visit: Payer: Medicare Other | Attending: Neurology

## 2017-01-27 DIAGNOSIS — G4733 Obstructive sleep apnea (adult) (pediatric): Secondary | ICD-10-CM | POA: Insufficient documentation

## 2017-01-27 DIAGNOSIS — G4761 Periodic limb movement disorder: Secondary | ICD-10-CM | POA: Diagnosis not present

## 2017-01-27 DIAGNOSIS — R0683 Snoring: Secondary | ICD-10-CM | POA: Insufficient documentation

## 2017-01-27 DIAGNOSIS — G4730 Sleep apnea, unspecified: Secondary | ICD-10-CM | POA: Diagnosis not present

## 2017-03-08 DIAGNOSIS — G319 Degenerative disease of nervous system, unspecified: Secondary | ICD-10-CM | POA: Diagnosis not present

## 2017-03-08 DIAGNOSIS — G4733 Obstructive sleep apnea (adult) (pediatric): Secondary | ICD-10-CM | POA: Diagnosis not present

## 2017-03-08 DIAGNOSIS — G3184 Mild cognitive impairment, so stated: Secondary | ICD-10-CM | POA: Diagnosis not present

## 2017-03-16 DIAGNOSIS — R339 Retention of urine, unspecified: Secondary | ICD-10-CM | POA: Diagnosis not present

## 2017-03-16 DIAGNOSIS — R972 Elevated prostate specific antigen [PSA]: Secondary | ICD-10-CM | POA: Diagnosis not present

## 2017-03-16 DIAGNOSIS — N401 Enlarged prostate with lower urinary tract symptoms: Secondary | ICD-10-CM | POA: Diagnosis not present

## 2017-03-16 DIAGNOSIS — N138 Other obstructive and reflux uropathy: Secondary | ICD-10-CM | POA: Diagnosis not present

## 2017-03-19 DIAGNOSIS — R0602 Shortness of breath: Secondary | ICD-10-CM | POA: Diagnosis not present

## 2017-03-19 DIAGNOSIS — I10 Essential (primary) hypertension: Secondary | ICD-10-CM | POA: Diagnosis not present

## 2017-03-19 DIAGNOSIS — I420 Dilated cardiomyopathy: Secondary | ICD-10-CM | POA: Diagnosis not present

## 2017-03-19 DIAGNOSIS — I4892 Unspecified atrial flutter: Secondary | ICD-10-CM | POA: Diagnosis not present

## 2017-03-20 DIAGNOSIS — G319 Degenerative disease of nervous system, unspecified: Secondary | ICD-10-CM | POA: Insufficient documentation

## 2017-04-05 DIAGNOSIS — I420 Dilated cardiomyopathy: Secondary | ICD-10-CM | POA: Diagnosis not present

## 2017-05-17 ENCOUNTER — Ambulatory Visit (INDEPENDENT_AMBULATORY_CARE_PROVIDER_SITE_OTHER): Payer: Medicare Other

## 2017-05-17 VITALS — BP 144/66 | HR 64 | Temp 97.6°F | Ht 76.0 in | Wt 234.4 lb

## 2017-05-17 DIAGNOSIS — Z0000 Encounter for general adult medical examination without abnormal findings: Secondary | ICD-10-CM

## 2017-05-17 NOTE — Progress Notes (Signed)
Subjective:   Jeffrey French is a 67 y.o. male who presents for Medicare Annual/Subsequent preventive examination.  Review of Systems:  N/A  Cardiac Risk Factors include: advanced age (>49men, >26 women);hypertension;male gender     Objective:    Vitals: BP (!) 144/66 (BP Location: Left Arm)   Pulse 64   Temp 97.6 F (36.4 C) (Oral)   Ht 6\' 4"  (1.93 m)   Wt 234 lb 6.4 oz (106.3 kg)   BMI 28.53 kg/m   Body mass index is 28.53 kg/m.  Advanced Directives 05/17/2017 04/28/2016 10/09/2015  Does Patient Have a Medical Advance Directive? Yes Yes Yes  Type of Paramedic of [place];Living will Living will;Healthcare Power of [place];Living will  Does patient want to make changes to medical advance directive? - - No - Patient declined  Copy of [place] in Chart? No - copy requested No - copy requested No - copy requested    Tobacco Social History   Tobacco Use  Smoking Status Former Smoker  . Types: Cigarettes  Smokeless Tobacco Never Used  Tobacco Comment   1974     Counseling given: Not Answered Comment: 1974   Clinical Intake:  Pre-visit preparation completed: Yes  Pain : No/denies pain Pain Score: 0-No pain     Nutritional Status: BMI 25 -29 Overweight Nutritional Risks: None Diabetes: No  How often do you need to have someone help you when you read instructions, pamphlets, or other written materials from your doctor or pharmacy?: 1 - Never  Interpreter Needed?: No  Information entered by :: [facility], LPN  Past Medical History:  Diagnosis Date  . BPH (benign prostatic hyperplasia)   . GERD (gastroesophageal reflux disease)   . Hypertension    Past Surgical History:  Procedure Laterality Date  . ELECTROPHYSIOLOGIC STUDY N/A 10/09/2015   Procedure: Cardioversion;  Surgeon: Isaias Cowman, MD;  Location: [facility] ORS;  Service: Cardiovascular;  Laterality: N/A;  . NASAL  RECONSTRUCTION WITH SEPTAL REPAIR    . REPLACEMENT TOTAL KNEE Right   . SHOULDER SURGERY Left   . TONSILLECTOMY     Family History  Problem Relation Age of Onset  . Arthritis Mother   . Hypertension Mother   . Colon cancer Mother   . Other Mother        lymph node  . Diabetes Father   . Congestive Heart Failure Father   . Arthritis Sister    Social History   Socioeconomic History  . Marital status: Married    Spouse name: None  . Number of children: 3  . Years of education: None  . Highest education level: Bachelor's degree (e.g., BA, AB, BS)  Social Needs  . Financial resource strain: Not hard at all  . Food insecurity - worry: Never true  . Food insecurity - inability: Never true  . Transportation needs - medical: No  . Transportation needs - non-medical: No  Occupational History  . Occupation: retired  Tobacco Use  . Smoking status: Former Smoker    Types: Cigarettes  . Smokeless tobacco: Never Used  . Tobacco comment: 1974  Substance and Sexual Activity  . Alcohol use: Yes    Alcohol/week: 0.0 - 1.2 oz  . Drug use: No  . Sexual activity: None  Other Topics Concern  . None  Social History Narrative  . None    Outpatient Encounter Medications as of 05/17/2017  Medication Sig  . alprazolam (XANAX) 2 MG tablet  Take 1 mg by mouth as needed.   Marland Kitchen amLODipine (NORVASC) 5 MG tablet Take 1 tablet (5 mg total) by mouth daily.  Marland Kitchen apixaban (ELIQUIS) 5 MG TABS tablet Take 5 mg by mouth 2 (two) times daily.  Marland Kitchen buPROPion (WELLBUTRIN XL) 300 MG 24 hr tablet Take 1 tablet (300 mg total) by mouth daily.  . Cholecalciferol (VITAMIN D) 2000 UNITS CAPS Take 1 capsule by mouth daily.   Marland Kitchen donepezil (ARICEPT) 5 MG tablet Take 5 mg by mouth daily.  . finasteride (PROSCAR) 5 MG tablet Take 5 mg by mouth daily.  . folic acid (FOLVITE) 517 MCG tablet Take 800 mcg by mouth daily.   . Melatonin 1 MG TABS Take 2 mg by mouth at bedtime.   . metoprolol succinate (TOPROL-XL) 50 MG 24 hr  tablet TAKE 1 TABLET BY MOUTH DAILY.  Marland Kitchen omeprazole (PRILOSEC) 20 MG capsule Take 1 capsule (20 mg total) by mouth daily.  . tamsulosin (FLOMAX) 0.4 MG CAPS capsule Take 1 capsule (0.4 mg total) by mouth daily.  Marland Kitchen venlafaxine XR (EFFEXOR-XR) 75 MG 24 hr capsule Take 1 capsule (75 mg total) by mouth 3 (three) times daily.  . temazepam (RESTORIL) 30 MG capsule Take 1 capsule (30 mg total) by mouth daily. (Patient not taking: Reported on 05/17/2017)   No facility-administered encounter medications on file as of 05/17/2017.     Activities of Daily Living In your present state of health, do you have any difficulty performing the following activities: 05/17/2017  Hearing? Y  Comment Wears bilateral hearing aids.  Vision? N  Difficulty concentrating or making decisions? Y  Comment Currently taking Aricept 5 mg for this.   Walking or climbing stairs? N  Dressing or bathing? N  Doing errands, shopping? N  Preparing Food and eating ? N  Using the Toilet? N  In the past six months, have you accidently leaked urine? N  Do you have problems with loss of bowel control? N  Managing your Medications? N  Managing your Finances? N  Housekeeping or managing your Housekeeping? N  Some recent data might be hidden    Patient Care Team: Jerrol Banana., MD as PCP - General (Family Medicine) Vladimir Crofts, MD as Consulting Physician (Neurology) Isaias Cowman, MD as Consulting Physician (Cardiology) Murrell Redden, MD as Consulting Physician (Urology) Thornton Park, MD as Referring Physician (Orthopedic Surgery) Ralene Bathe, MD as Consulting Physician (Dermatology) Beverly Gust, MD as Consulting Physician (Otolaryngology)   Assessment:   This is a routine wellness examination for Jeffrey French.  Exercise Activities and Dietary recommendations Current Exercise Habits: The patient does not participate in regular exercise at present, Exercise limited by: Other - see comments(stays busy  doing side jobs Engineer, materials))  Goals    . DIET - EAT MORE FRUITS AND VEGETABLES     Recommend increasing amount of fruits and vegetables in daily diet to at least 1 serving of each daily.        Fall Risk Fall Risk  05/17/2017 04/28/2016 02/20/2015  Falls in the past year? No Yes No  Number falls in past yr: - 2 or more -  Injury with Fall? - No -   Is the patient's home free of loose throw rugs in walkways, pet beds, electrical cords, etc?   yes      Grab bars in the bathroom? no      Handrails on the stairs?   yes      Adequate lighting?  yes  Timed Get Up and Go Performed: N/A  Depression Screen PHQ 2/9 Scores 05/17/2017 11/26/2016 04/28/2016 02/20/2015  PHQ - 2 Score 0 0 0 0  PHQ- 9 Score - 2 - -    Cognitive Function: Pt declined screening today. Pt f/u with Dr Manuella Ghazi on this.   MMSE - Mini Mental State Exam 11/26/2016  Orientation to time 4  Orientation to Place 5  Registration 3  Attention/ Calculation 5  Recall 1  Language- name 2 objects 2  Language- repeat 1  Language- follow 3 step command 2  Language- read & follow direction 1  Write a sentence 1  Copy design 1  Total score 26     6CIT Screen 04/28/2016  What Year? 0 points  What month? 0 points  What time? 0 points  Count back from 20 0 points  Months in reverse 0 points  Repeat phrase 2 points  Total Score 2    Immunization History  Administered Date(s) Administered  . Hepatitis A 11/29/2009  . Tdap 11/29/2009  . Zoster 01/19/2013    Qualifies for Shingles Vaccine? Zostavax received in 2014. Pt declined the Shingrix today.   Screening Tests Health Maintenance  Topic Date Due  . PNA vac Low Risk Adult (2 of 2 - PPSV23) 04/28/2017  . INFLUENZA VACCINE  07/04/2017 (Originally 11/04/2016)  . TETANUS/TDAP  11/30/2019  . COLONOSCOPY  11/22/2023  . Hepatitis C Screening  Completed   Cancer Screenings: Lung: Low Dose CT Chest recommended if Age 60-80 years, 30 pack-year currently smoking OR have  quit w/in 15years. Patient does not qualify. Colorectal: Up to date  Additional Screenings:  Hepatitis B/HIV/Syphillis: Pt declines today.  Hepatitis C Screening: Up to date    Plan:  I have personally reviewed and addressed the Medicare Annual Wellness questionnaire and have noted the following in the patient's chart:  A. Medical and social history B. Use of alcohol, tobacco or illicit drugs  C. Current medications and supplements D. Functional ability and status E.  Nutritional status F.  Physical activity G. Advance directives H. List of other physicians I.  Hospitalizations, surgeries, and ER visits in previous 12 months J.  [place] such as hearing and vision if needed, cognitive and depression L. Referrals and appointments - none  In addition, I have reviewed and discussed with patient certain preventive protocols, quality metrics, and best practice recommendations. A written personalized care plan for preventive services as well as general preventive health recommendations were provided to patient.  See attached scanned questionnaire for additional information.   Signed,  Fabio Neighbors, LPN Nurse Health Advisor   Nurse Recommendations: Pt declined the influenza and pneumonia vaccines today. Pt wanted me to make a note (so he does not forget for tomorrows OV) that he has a boil under his left armpit that has appeared sometime in the last 30 days and he would like PCP to evaluate this tomorrow.

## 2017-05-17 NOTE — Patient Instructions (Signed)
Jeffrey French , Thank you for taking time to come for your Medicare Wellness Visit. I appreciate your ongoing commitment to your health goals. Please review the following plan we discussed and let me know if I can assist you in the future.   Screening recommendations/referrals: Colonoscopy: Up to date Recommended yearly ophthalmology/optometry visit for glaucoma screening and checkup Recommended yearly dental visit for hygiene and checkup  Vaccinations: Influenza vaccine: Pt declines today.  Pneumococcal vaccine: Pt declines today.   Tdap vaccine: Up to date Shingles vaccine: Zostavax completed in 2014. Pt declined the Shingrix vaccine today.    Advanced directives: Please bring a copy of your POA (Power of Attorney) and/or Living Will to your next appointment.   Conditions/risks identified: Recommend increasing amount of fruits and vegetables in daily diet to at least 1 serving of each daily.   Next appointment: 05/18/17 @ 8:30 AM  Preventive Care 65 Years and Older, Male Preventive care refers to lifestyle choices and visits with your health care provider that can promote health and wellness. What does preventive care include?  A yearly physical exam. This is also called an annual well check.  Dental exams once or twice a year.  Routine eye exams. Ask your health care provider how often you should have your eyes checked.  Personal lifestyle choices, including:  Daily care of your teeth and gums.  Regular physical activity.  Eating a healthy diet.  Avoiding tobacco and drug use.  Limiting alcohol use.  Practicing safe sex.  Taking low doses of aspirin every day.  Taking vitamin and mineral supplements as recommended by your health care provider. What happens during an annual well check? The services and screenings done by your health care provider during your annual well check will depend on your age, overall health, lifestyle risk factors, and family history of  disease. Counseling  Your health care provider may ask you questions about your:  Alcohol use.  Tobacco use.  Drug use.  Emotional well-being.  Home and relationship well-being.  Sexual activity.  Eating habits.  History of falls.  Memory and ability to understand (cognition).  Work and work Statistician. Screening  You may have the following tests or measurements:  Height, weight, and BMI.  Blood pressure.  Lipid and cholesterol levels. These may be checked every 5 years, or more frequently if you are over 25 years old.  Skin check.  Lung cancer screening. You may have this screening every year starting at age 1 if you have a 30-pack-year history of smoking and currently smoke or have quit within the past 15 years.  Fecal occult blood test (FOBT) of the stool. You may have this test every year starting at age 59.  Flexible sigmoidoscopy or colonoscopy. You may have a sigmoidoscopy every 5 years or a colonoscopy every 10 years starting at age 30.  Prostate cancer screening. Recommendations will vary depending on your family history and other risks.  Hepatitis C blood test.  Hepatitis B blood test.  Sexually transmitted disease (STD) testing.  Diabetes screening. This is done by checking your blood sugar (glucose) after you have not eaten for a while (fasting). You may have this done every 1-3 years.  Abdominal aortic aneurysm (AAA) screening. You may need this if you are a current or former smoker.  Osteoporosis. You may be screened starting at age 12 if you are at high risk. Talk with your health care provider about your test results, treatment options, and if necessary, the need for  more tests. Vaccines  Your health care provider may recommend certain vaccines, such as:  Influenza vaccine. This is recommended every year.  Tetanus, diphtheria, and acellular pertussis (Tdap, Td) vaccine. You may need a Td booster every 10 years.  Zoster vaccine. You may  need this after age 24.  Pneumococcal 13-valent conjugate (PCV13) vaccine. One dose is recommended after age 43.  Pneumococcal polysaccharide (PPSV23) vaccine. One dose is recommended after age 47. Talk to your health care provider about which screenings and vaccines you need and how often you need them. This information is not intended to replace advice given to you by your health care provider. Make sure you discuss any questions you have with your health care provider. Document Released: 04/19/2015 Document Revised: 12/11/2015 Document Reviewed: 01/22/2015 Elsevier Interactive Patient Education  2017 [place] Prevention in the Home Falls can cause injuries. They can happen to people of all ages. There are many things you can do to make your home safe and to help prevent falls. What can I do on the outside of my home?  Regularly fix the edges of walkways and driveways and fix any cracks.  Remove anything that might make you trip as you walk through a door, such as a raised step or threshold.  Trim any bushes or trees on the path to your home.  Use bright outdoor lighting.  Clear any walking paths of anything that might make someone trip, such as rocks or tools.  Regularly check to see if handrails are loose or broken. Make sure that both sides of any steps have handrails.  Any raised decks and porches should have guardrails on the edges.  Have any leaves, snow, or ice cleared regularly.  Use sand or salt on walking paths during winter.  Clean up any spills in your garage right away. This includes oil or grease spills. What can I do in the bathroom?  Use night lights.  Install grab bars by the toilet and in the tub and shower. Do not use towel bars as grab bars.  Use non-skid mats or decals in the tub or shower.  If you need to sit down in the shower, use a plastic, non-slip stool.  Keep the floor dry. Clean up any water that spills on the floor as soon as it  happens.  Remove soap buildup in the tub or shower regularly.  Attach bath mats securely with double-sided non-slip rug tape.  Do not have throw rugs and other things on the floor that can make you trip. What can I do in the bedroom?  Use night lights.  Make sure that you have a light by your bed that is easy to reach.  Do not use any sheets or blankets that are too big for your bed. They should not hang down onto the floor.  Have a firm chair that has side arms. You can use this for support while you get dressed.  Do not have throw rugs and other things on the floor that can make you trip. What can I do in the kitchen?  Clean up any spills right away.  Avoid walking on wet floors.  Keep items that you use a lot in easy-to-reach places.  If you need to reach something above you, use a strong step stool that has a grab bar.  Keep electrical cords out of the way.  Do not use floor polish or wax that makes floors slippery. If you must use wax, use non-skid  floor wax.  Do not have throw rugs and other things on the floor that can make you trip. What can I do with my stairs?  Do not leave any items on the stairs.  Make sure that there are handrails on both sides of the stairs and use them. Fix handrails that are broken or loose. Make sure that handrails are as long as the stairways.  Check any carpeting to make sure that it is firmly attached to the stairs. Fix any carpet that is loose or worn.  Avoid having throw rugs at the top or bottom of the stairs. If you do have throw rugs, attach them to the floor with carpet tape.  Make sure that you have a light switch at the top of the stairs and the bottom of the stairs. If you do not have them, ask someone to add them for you. What else can I do to help prevent falls?  Wear shoes that:  Do not have high heels.  Have rubber bottoms.  Are comfortable and fit you well.  Are closed at the toe. Do not wear sandals.  If you  use a stepladder:  Make sure that it is fully opened. Do not climb a closed stepladder.  Make sure that both sides of the stepladder are locked into place.  Ask someone to hold it for you, if possible.  Clearly mark and make sure that you can see:  Any grab bars or handrails.  First and last steps.  Where the edge of each step is.  Use tools that help you move around (mobility aids) if they are needed. These include:  Canes.  Walkers.  Scooters.  Crutches.  Turn on the lights when you go into a dark area. Replace any light bulbs as soon as they burn out.  Set up your furniture so you have a clear path. Avoid moving your furniture around.  If any of your floors are uneven, fix them.  If there are any pets around you, be aware of where they are.  Review your medicines with your doctor. Some medicines can make you feel dizzy. This can increase your chance of falling. Ask your doctor what other things that you can do to help prevent falls. This information is not intended to replace advice given to you by your health care provider. Make sure you discuss any questions you have with your health care provider. Document Released: 01/17/2009 Document Revised: 08/29/2015 Document Reviewed: 04/27/2014 Elsevier Interactive Patient Education  2017 [employer].

## 2017-05-18 ENCOUNTER — Other Ambulatory Visit: Payer: Self-pay

## 2017-05-18 ENCOUNTER — Ambulatory Visit (INDEPENDENT_AMBULATORY_CARE_PROVIDER_SITE_OTHER): Payer: Medicare Other | Admitting: Family Medicine

## 2017-05-18 VITALS — BP 140/58 | HR 64 | Temp 98.0°F | Resp 16 | Ht 75.0 in | Wt 234.0 lb

## 2017-05-18 DIAGNOSIS — F3289 Other specified depressive episodes: Secondary | ICD-10-CM | POA: Diagnosis not present

## 2017-05-18 DIAGNOSIS — I10 Essential (primary) hypertension: Secondary | ICD-10-CM | POA: Diagnosis not present

## 2017-05-18 DIAGNOSIS — J0140 Acute pansinusitis, unspecified: Secondary | ICD-10-CM

## 2017-05-18 DIAGNOSIS — G4709 Other insomnia: Secondary | ICD-10-CM

## 2017-05-18 DIAGNOSIS — F411 Generalized anxiety disorder: Secondary | ICD-10-CM | POA: Diagnosis not present

## 2017-05-18 DIAGNOSIS — E785 Hyperlipidemia, unspecified: Secondary | ICD-10-CM

## 2017-05-18 MED ORDER — AMOXICILLIN 500 MG PO CAPS: 1000.0000 mg | ORAL_CAPSULE | Freq: Two times a day (BID) | ORAL | 0 refills | Status: DC

## 2017-05-18 NOTE — Progress Notes (Signed)
Jeffrey French  MRN: [id number] DOB: 06-17-1950  Subjective:  HPI   The patient is a 67 year old male who presents for follow up for chronic issues.  Depression-patient states he is very happy with the medications he is taking for this and feels he is doing well.  Depression screen [facility] 2/9 05/17/2017 11/26/2016 04/28/2016  Decreased Interest 0 0 0  Down, Depressed, Hopeless 0 0 0  PHQ - 2 Score 0 0 0  Altered sleeping - 2 -  Tired, decreased energy - 0 -  Change in appetite - 0 -  Feeling bad or failure about yourself  - 0 -  Trouble concentrating - 0 -  Moving slowly or fidgety/restless - 0 -  Suicidal thoughts - 0 -  PHQ-9 Score - 2 -  Difficult doing work/chores - Not difficult at all -   Hypertension-states he does not have this checked outside of medical offices.  He states that this has been relatively stable.  BP Readings from Last 3 Encounters:  05/18/17 (!) 140/58  05/17/17 (!) 144/66  11/26/16 118/62   Hypercholesterolemia Lab Results  Component Value Date   CHOL 190 11/26/2016   HDL 48 11/26/2016   LDLCALC 115 (H) 11/26/2016   TRIG 133 11/26/2016   CHOLHDL 4.0 11/26/2016   Insomnia-The patient is currently only using the Melatonin for sleep.  He has been doing fairly well but does not want to take the Temazepam off of the med list in the event he needs to take it.    Patient Active Problem List   Diagnosis Date Noted  . New onset atrial flutter ([place]) 09/18/2015  . Irregular heart rhythm 09/17/2015  . Absolute anemia 02/18/2015  . Benign fibroma of prostate 02/18/2015  . Clinical depression 02/18/2015  . Abnormal prostate specific antigen 02/18/2015  . Essential (primary) hypertension 02/18/2015  . Anxiety, generalized 02/18/2015  . Cannot sleep 02/18/2015  . Arthritis, degenerative 02/18/2015  . Peptic ulcer 02/18/2015  . Elevated prostate specific antigen (PSA) 10/19/2013    Past Medical History:  Diagnosis Date  . BPH (benign prostatic  hyperplasia)   . GERD (gastroesophageal reflux disease)   . Hypertension     Social History   Socioeconomic History  . Marital status: Married    Spouse name: Not on file  . Number of children: 3  . Years of education: Not on file  . Highest education level: Bachelor's degree (e.g., BA, AB, BS)  Social Needs  . Financial resource strain: Not hard at all  . Food insecurity - worry: Never true  . Food insecurity - inability: Never true  . Transportation needs - medical: No  . Transportation needs - non-medical: No  Occupational History  . Occupation: retired  Tobacco Use  . Smoking status: Former Smoker    Types: Cigarettes  . Smokeless tobacco: Never Used  . Tobacco comment: 1974  Substance and Sexual Activity  . Alcohol use: Yes    Alcohol/week: 0.0 - 1.2 oz  . Drug use: No  . Sexual activity: Not on file  Other Topics Concern  . Not on file  Social History Narrative  . Not on file    Outpatient Encounter Medications as of 05/18/2017  Medication Sig Note  . alprazolam (XANAX) 2 MG tablet Take 1 mg by mouth as needed.  02/18/2015: Medication taken as needed.  Received from: [facility]  . amLODipine (NORVASC) 5 MG tablet Take 1 tablet (5 mg total) by mouth daily.   Marland Kitchen  apixaban (ELIQUIS) 5 MG TABS tablet Take 5 mg by mouth 2 (two) times daily.   Marland Kitchen buPROPion (WELLBUTRIN XL) 300 MG 24 hr tablet Take 1 tablet (300 mg total) by mouth daily.   . Cholecalciferol (VITAMIN D) 2000 UNITS CAPS Take 1 capsule by mouth daily.  02/18/2015: Received from: [facility]  . donepezil (ARICEPT) 5 MG tablet Take 5 mg by mouth daily.   . finasteride (PROSCAR) 5 MG tablet Take 5 mg by mouth daily.   . folic acid (FOLVITE) 161 MCG tablet Take 800 mcg by mouth daily.  02/18/2015: Received from: [facility]  . Melatonin 1 MG TABS Take 2 mg by mouth at bedtime.  02/18/2015: Received from: [facility]  . metoprolol succinate  (TOPROL-XL) 50 MG 24 hr tablet TAKE 1 TABLET BY MOUTH DAILY.   Marland Kitchen omeprazole (PRILOSEC) 20 MG capsule Take 1 capsule (20 mg total) by mouth daily.   . tamsulosin (FLOMAX) 0.4 MG CAPS capsule Take 1 capsule (0.4 mg total) by mouth daily.   . temazepam (RESTORIL) 30 MG capsule Take 1 capsule (30 mg total) by mouth daily.   Marland Kitchen venlafaxine XR (EFFEXOR-XR) 75 MG 24 hr capsule Take 1 capsule (75 mg total) by mouth 3 (three) times daily.    No facility-administered encounter medications on file as of 05/18/2017.     Allergies  Allergen Reactions  . Sulfamethoxazole-Trimethoprim     Review of Systems  Constitutional: Negative.   HENT: Positive for congestion, sinus pain and tinnitus.   Eyes: Negative.   Respiratory: Negative.   Cardiovascular: Negative.   Gastrointestinal: Negative.   Genitourinary: Negative.   Musculoskeletal: Negative.   Skin:       Boil under armpit   Neurological: Negative.   Endo/Heme/Allergies: Negative.   Psychiatric/Behavioral: Negative.     Objective:  BP (!) 140/58 (BP Location: Right Arm, Patient Position: Sitting, Cuff Size: Normal)   Pulse 64   Temp 98 F (36.7 C) (Oral)   Resp 16   Ht 6\' 3"  (1.905 m)   Wt 234 lb (106.1 kg)   BMI 29.25 kg/m   Physical Exam  Constitutional: He is oriented to person, place, and time and well-developed, well-nourished, and in no distress.  HENT:  Head: Normocephalic and atraumatic.  Right Ear: External ear normal.  Left Ear: External ear normal.  Nose: Nose normal.  Mouth/Throat: Oropharynx is clear and moist.  Eyes: Conjunctivae are normal. No scleral icterus.  Neck: No thyromegaly present.  Cardiovascular: Normal rate, regular rhythm, normal heart sounds and intact distal pulses.  Pulmonary/Chest: Effort normal and breath sounds normal.  Abdominal: Soft.  Musculoskeletal: He exhibits edema.  Trace edema.  Neurological: He is alert and oriented to person, place, and time. Gait normal. GCS score is 15.  Skin:  Skin is warm and dry.  Left axilla with inflamed but noninfected hair follicle.  Psychiatric: Mood, memory, affect and judgment normal.    Assessment and Plan :  1. Acute non-recurrent pansinusitis  - amoxicillin (AMOXIL) 500 MG capsule; Take 2 capsules (1,000 mg total) by mouth 2 (two) times daily.  Dispense: 40 capsule; Refill: 0  2. Other depression   3. Essential (primary) hypertension   4. Other insomnia   5. Anxiety, generalized   6. Hyperlipidemia, unspecified hyperlipidemia type 7.OA 8.BPH with elevated PSA Per Dr Jacqlyn Larsen. 9.Early Bilateral Cataracts  I have done the exam and reviewed the chart and it is accurate to the best of my knowledge. Development worker, community  has been used and  any errors in dictation or transcription are unintentional. Miguel Aschoff M.D. [facility]

## 2017-06-07 ENCOUNTER — Other Ambulatory Visit: Payer: Self-pay | Admitting: Family Medicine

## 2017-06-07 DIAGNOSIS — F329 Major depressive disorder, single episode, unspecified: Secondary | ICD-10-CM

## 2017-06-07 DIAGNOSIS — I10 Essential (primary) hypertension: Secondary | ICD-10-CM

## 2017-06-07 DIAGNOSIS — F32A Depression, unspecified: Secondary | ICD-10-CM

## 2017-07-26 ENCOUNTER — Other Ambulatory Visit: Payer: Self-pay | Admitting: Family Medicine

## 2017-07-26 DIAGNOSIS — K279 Peptic ulcer, site unspecified, unspecified as acute or chronic, without hemorrhage or perforation: Secondary | ICD-10-CM

## 2017-08-27 ENCOUNTER — Other Ambulatory Visit: Payer: Self-pay | Admitting: Family Medicine

## 2017-08-27 DIAGNOSIS — F32A Depression, unspecified: Secondary | ICD-10-CM

## 2017-08-27 DIAGNOSIS — F329 Major depressive disorder, single episode, unspecified: Secondary | ICD-10-CM

## 2017-09-14 DIAGNOSIS — S39012A Strain of muscle, fascia and tendon of lower back, initial encounter: Secondary | ICD-10-CM | POA: Diagnosis not present

## 2017-09-15 DIAGNOSIS — R0602 Shortness of breath: Secondary | ICD-10-CM | POA: Diagnosis not present

## 2017-09-15 DIAGNOSIS — I420 Dilated cardiomyopathy: Secondary | ICD-10-CM | POA: Diagnosis not present

## 2017-09-15 DIAGNOSIS — I4891 Unspecified atrial fibrillation: Secondary | ICD-10-CM | POA: Diagnosis not present

## 2017-09-15 DIAGNOSIS — I10 Essential (primary) hypertension: Secondary | ICD-10-CM | POA: Diagnosis not present

## 2017-09-22 DIAGNOSIS — M5416 Radiculopathy, lumbar region: Secondary | ICD-10-CM | POA: Diagnosis not present

## 2017-09-22 DIAGNOSIS — M5126 Other intervertebral disc displacement, lumbar region: Secondary | ICD-10-CM | POA: Diagnosis not present

## 2017-09-22 DIAGNOSIS — S39012A Strain of muscle, fascia and tendon of lower back, initial encounter: Secondary | ICD-10-CM | POA: Diagnosis not present

## 2017-09-27 DIAGNOSIS — M5416 Radiculopathy, lumbar region: Secondary | ICD-10-CM | POA: Diagnosis not present

## 2017-09-29 ENCOUNTER — Other Ambulatory Visit: Payer: Self-pay | Admitting: *Deleted

## 2017-09-29 NOTE — Telephone Encounter (Signed)
Patient is requesting rx for alprazolam 2 mg prn be sent to [facility]. Please advise?

## 2017-09-30 ENCOUNTER — Telehealth: Payer: Self-pay

## 2017-09-30 MED ORDER — ALPRAZOLAM 2 MG PO TABS: 1.0000 mg | ORAL_TABLET | ORAL | 0 refills | Status: DC | PRN

## 2017-09-30 NOTE — Telephone Encounter (Signed)
Advised pharmacy.

## 2017-09-30 NOTE — Telephone Encounter (Signed)
2mg

## 2017-09-30 NOTE — Telephone Encounter (Signed)
Please advise 

## 2017-09-30 NOTE — Telephone Encounter (Signed)
[facility] called needing clarification on the directions for pt Alprazolam.   They are needing the max daily dosage.   Thanks,   -Mickel Baas

## 2017-10-21 DIAGNOSIS — R0602 Shortness of breath: Secondary | ICD-10-CM | POA: Diagnosis not present

## 2017-10-21 DIAGNOSIS — I10 Essential (primary) hypertension: Secondary | ICD-10-CM | POA: Diagnosis not present

## 2017-10-21 DIAGNOSIS — I4891 Unspecified atrial fibrillation: Secondary | ICD-10-CM | POA: Diagnosis not present

## 2017-10-21 DIAGNOSIS — I499 Cardiac arrhythmia, unspecified: Secondary | ICD-10-CM | POA: Diagnosis not present

## 2017-10-21 DIAGNOSIS — I420 Dilated cardiomyopathy: Secondary | ICD-10-CM | POA: Diagnosis not present

## 2017-10-21 DIAGNOSIS — R002 Palpitations: Secondary | ICD-10-CM | POA: Diagnosis not present

## 2017-10-21 DIAGNOSIS — R5383 Other fatigue: Secondary | ICD-10-CM | POA: Diagnosis not present

## 2017-10-27 ENCOUNTER — Other Ambulatory Visit: Payer: Self-pay | Admitting: Family Medicine

## 2017-10-27 DIAGNOSIS — I491 Atrial premature depolarization: Secondary | ICD-10-CM | POA: Diagnosis not present

## 2017-11-01 DIAGNOSIS — R0602 Shortness of breath: Secondary | ICD-10-CM | POA: Diagnosis not present

## 2017-11-01 DIAGNOSIS — I420 Dilated cardiomyopathy: Secondary | ICD-10-CM | POA: Diagnosis not present

## 2017-11-01 DIAGNOSIS — I4891 Unspecified atrial fibrillation: Secondary | ICD-10-CM | POA: Diagnosis not present

## 2017-11-01 DIAGNOSIS — R002 Palpitations: Secondary | ICD-10-CM | POA: Diagnosis not present

## 2017-11-01 DIAGNOSIS — I10 Essential (primary) hypertension: Secondary | ICD-10-CM | POA: Diagnosis not present

## 2017-11-04 DIAGNOSIS — G319 Degenerative disease of nervous system, unspecified: Secondary | ICD-10-CM | POA: Diagnosis not present

## 2017-11-04 DIAGNOSIS — G4733 Obstructive sleep apnea (adult) (pediatric): Secondary | ICD-10-CM | POA: Diagnosis not present

## 2017-11-04 DIAGNOSIS — Z8659 Personal history of other mental and behavioral disorders: Secondary | ICD-10-CM | POA: Diagnosis not present

## 2017-11-04 DIAGNOSIS — G3184 Mild cognitive impairment, so stated: Secondary | ICD-10-CM | POA: Diagnosis not present

## 2017-11-15 ENCOUNTER — Ambulatory Visit: Payer: Self-pay | Admitting: Family Medicine

## 2017-11-15 DIAGNOSIS — I420 Dilated cardiomyopathy: Secondary | ICD-10-CM | POA: Diagnosis not present

## 2017-11-15 DIAGNOSIS — R002 Palpitations: Secondary | ICD-10-CM | POA: Diagnosis not present

## 2017-11-15 DIAGNOSIS — I4891 Unspecified atrial fibrillation: Secondary | ICD-10-CM | POA: Diagnosis not present

## 2017-11-15 DIAGNOSIS — J0140 Acute pansinusitis, unspecified: Secondary | ICD-10-CM | POA: Diagnosis not present

## 2017-11-15 DIAGNOSIS — I480 Paroxysmal atrial fibrillation: Secondary | ICD-10-CM | POA: Diagnosis not present

## 2017-11-15 DIAGNOSIS — R0602 Shortness of breath: Secondary | ICD-10-CM | POA: Diagnosis not present

## 2017-11-16 ENCOUNTER — Ambulatory Visit (INDEPENDENT_AMBULATORY_CARE_PROVIDER_SITE_OTHER): Payer: Medicare Other | Admitting: Family Medicine

## 2017-11-16 ENCOUNTER — Other Ambulatory Visit: Payer: Self-pay | Admitting: Family Medicine

## 2017-11-16 ENCOUNTER — Encounter: Payer: Self-pay | Admitting: Family Medicine

## 2017-11-16 VITALS — BP 142/68 | HR 60 | Temp 98.6°F | Resp 16 | Wt 229.0 lb

## 2017-11-16 DIAGNOSIS — I10 Essential (primary) hypertension: Secondary | ICD-10-CM | POA: Diagnosis not present

## 2017-11-16 DIAGNOSIS — N400 Enlarged prostate without lower urinary tract symptoms: Secondary | ICD-10-CM | POA: Diagnosis not present

## 2017-11-16 MED ORDER — FINASTERIDE 5 MG PO TABS: 5.0000 mg | ORAL_TABLET | Freq: Every day | ORAL | 11 refills | Status: DC

## 2017-11-16 MED ORDER — TAMSULOSIN HCL 0.4 MG PO CAPS
0.4000 mg | ORAL_CAPSULE | Freq: Every day | ORAL | 11 refills | Status: DC
Start: 2017-11-16 — End: 2018-12-07

## 2017-11-16 NOTE — Progress Notes (Signed)
Patient: Jeffrey French Male    DOB: 18-Apr-1950   67 y.o.   MRN: [id number] Visit Date: 11/16/2017  Today's Provider: [name], MD   No chief complaint on file.  Subjective:    HPI Patient comes in today for a follow up. He was last seen in the office 6 month ago. Since his last visit, he reports that per cardiology, he is no longer on amlodipine. He also reports that his urologist (Dr. Jacqlyn Larsen) has moved out of town, and he will need refills on finasteride 5mg .     BP Readings from Last 3 Encounters:  11/16/17 (!) 142/68  05/18/17 (!) 140/58  05/17/17 (!) 144/66     Allergies  Allergen Reactions  . Sulfamethoxazole-Trimethoprim      Current Outpatient Medications:  .  alprazolam (XANAX) 2 MG tablet, Take 0.5 tablets (1 mg total) by mouth as needed., Disp: 30 tablet, Rfl: 0 .  amoxicillin (AMOXIL) 500 MG capsule, Take 2 capsules (1,000 mg total) by mouth 2 (two) times daily., Disp: 40 capsule, Rfl: 0 .  apixaban (ELIQUIS) 5 MG TABS tablet, Take 5 mg by mouth 2 (two) times daily., Disp: , Rfl:  .  buPROPion (WELLBUTRIN XL) 300 MG 24 hr tablet, TAKE 1 TABLET BY MOUTH EVERY DAY., Disp: 90 tablet, Rfl: 3 .  Cholecalciferol (VITAMIN D) 2000 UNITS CAPS, Take 1 capsule by mouth daily. , Disp: , Rfl:  .  donepezil (ARICEPT) 5 MG tablet, Take 5 mg by mouth daily., Disp: , Rfl:  .  finasteride (PROSCAR) 5 MG tablet, Take 5 mg by mouth daily., Disp: , Rfl:  .  folic acid (FOLVITE) 092 MCG tablet, Take 800 mcg by mouth daily. , Disp: , Rfl:  .  Melatonin 1 MG TABS, Take 2 mg by mouth at bedtime. , Disp: , Rfl:  .  metoprolol succinate (TOPROL-XL) 50 MG 24 hr tablet, TAKE 1 TABLET BY MOUTH DAILY., Disp: 90 tablet, Rfl: 3 .  omeprazole (PRILOSEC) 20 MG capsule, TAKE 1 CAPSULE BY MOUTH EVERY DAY., Disp: 90 capsule, Rfl: 3 .  tamsulosin (FLOMAX) 0.4 MG CAPS capsule, Take 1 capsule (0.4 mg total) by mouth daily., Disp: 30 capsule, Rfl: 3 .  amLODipine (NORVASC) 5 MG tablet, TAKE 1  TABLET BY MOUTH EVERY DAY. (Patient not taking: Reported on 11/16/2017), Disp: 90 tablet, Rfl: 3 .  temazepam (RESTORIL) 30 MG capsule, Take 1 capsule (30 mg total) by mouth daily., Disp: 30 capsule, Rfl: 5 .  venlafaxine XR (EFFEXOR-XR) 75 MG 24 hr capsule, TAKE THREE CAPSULES BY MOUTH EVERY MORNING, Disp: 270 capsule, Rfl: 3  Review of Systems  Constitutional: Negative for activity change, appetite change, chills, diaphoresis, fatigue, fever and unexpected weight change.  HENT: Negative.   Eyes: Negative.   Respiratory: Negative for cough and shortness of breath.   Cardiovascular: Negative for chest pain, palpitations and leg swelling.  Gastrointestinal: Negative.   Endocrine: Negative for cold intolerance, heat intolerance, polydipsia, polyphagia and polyuria.  Musculoskeletal: Negative for arthralgias and myalgias.  Skin: Negative.   Allergic/Immunologic: Negative.   Neurological: Negative for dizziness, weakness, light-headedness and headaches.  Psychiatric/Behavioral: Negative.     Social History   Tobacco Use  . Smoking status: Former Smoker    Types: Cigarettes  . Smokeless tobacco: Never Used  . Tobacco comment: 1974  Substance Use Topics  . Alcohol use: Yes    Alcohol/week: 0.0 - 2.0 standard drinks   Objective:   BP Marland Kitchen)  142/68   Pulse 60   Temp 98.6 F (37 C)   Resp 16   Wt 229 lb (103.9 kg)   SpO2 98%   BMI 28.62 kg/m  Vitals:   11/16/17 1120  BP: (!) 142/68  Pulse: 60  Resp: 16  Temp: 98.6 F (37 C)  SpO2: 98%  Weight: 229 lb (103.9 kg)     Physical Exam  Constitutional: He is oriented to person, place, and time. He appears well-developed and well-nourished.  HENT:  Head: Normocephalic and atraumatic.  Eyes: Conjunctivae are normal. No scleral icterus.  Neck: No thyromegaly present.  Cardiovascular: Normal rate, regular rhythm and normal heart sounds.  Pulmonary/Chest: Effort normal and breath sounds normal.  Abdominal: Soft.  Musculoskeletal:  He exhibits no edema.  Neurological: He is alert and oriented to person, place, and time.  Skin: Skin is warm and dry.  Psychiatric: He has a normal mood and affect. His behavior is normal. Judgment and thought content normal.        Assessment & Plan:      1. Essential (primary) hypertension BP at home 832-919 systolic.  2. Benign fibroma of prostate  - tamsulosin (FLOMAX) 0.4 MG CAPS capsule; Take 1 capsule (0.4 mg total) by mouth daily.  Dispense: 30 capsule; Refill: 11 - finasteride (PROSCAR) 5 MG tablet; Take 1 tablet (5 mg total) by mouth daily.  Dispense: 30 tablet; Refill: 11 3.MDD Stable. .    I have done the exam and reviewed the above chart and it is accurate to the best of my knowledge. Development worker, community has been used in this note in any air is in the dictation or transcription are unintentional.  [name], MD  [place]

## 2017-11-19 DIAGNOSIS — I493 Ventricular premature depolarization: Secondary | ICD-10-CM | POA: Diagnosis not present

## 2017-11-23 DIAGNOSIS — I4891 Unspecified atrial fibrillation: Secondary | ICD-10-CM | POA: Diagnosis not present

## 2017-11-23 DIAGNOSIS — I420 Dilated cardiomyopathy: Secondary | ICD-10-CM | POA: Diagnosis not present

## 2017-11-23 DIAGNOSIS — I4892 Unspecified atrial flutter: Secondary | ICD-10-CM | POA: Diagnosis not present

## 2017-11-23 DIAGNOSIS — I10 Essential (primary) hypertension: Secondary | ICD-10-CM | POA: Diagnosis not present

## 2017-11-23 DIAGNOSIS — R0602 Shortness of breath: Secondary | ICD-10-CM | POA: Diagnosis not present

## 2017-11-23 DIAGNOSIS — R002 Palpitations: Secondary | ICD-10-CM | POA: Diagnosis not present

## 2017-12-06 DIAGNOSIS — R931 Abnormal findings on diagnostic imaging of heart and coronary circulation: Secondary | ICD-10-CM | POA: Diagnosis not present

## 2017-12-06 DIAGNOSIS — Z79899 Other long term (current) drug therapy: Secondary | ICD-10-CM | POA: Diagnosis not present

## 2017-12-06 DIAGNOSIS — R079 Chest pain, unspecified: Secondary | ICD-10-CM | POA: Diagnosis not present

## 2017-12-06 DIAGNOSIS — J811 Chronic pulmonary edema: Secondary | ICD-10-CM | POA: Diagnosis not present

## 2017-12-06 DIAGNOSIS — R0602 Shortness of breath: Secondary | ICD-10-CM | POA: Diagnosis not present

## 2017-12-06 DIAGNOSIS — R000 Tachycardia, unspecified: Secondary | ICD-10-CM | POA: Diagnosis not present

## 2017-12-06 DIAGNOSIS — I480 Paroxysmal atrial fibrillation: Secondary | ICD-10-CM | POA: Diagnosis not present

## 2017-12-06 DIAGNOSIS — I10 Essential (primary) hypertension: Secondary | ICD-10-CM | POA: Diagnosis not present

## 2017-12-06 DIAGNOSIS — K5900 Constipation, unspecified: Secondary | ICD-10-CM | POA: Diagnosis not present

## 2017-12-06 DIAGNOSIS — I351 Nonrheumatic aortic (valve) insufficiency: Secondary | ICD-10-CM | POA: Diagnosis not present

## 2017-12-06 DIAGNOSIS — Z5181 Encounter for therapeutic drug level monitoring: Secondary | ICD-10-CM | POA: Diagnosis not present

## 2017-12-06 DIAGNOSIS — R918 Other nonspecific abnormal finding of lung field: Secondary | ICD-10-CM | POA: Diagnosis not present

## 2017-12-06 DIAGNOSIS — J189 Pneumonia, unspecified organism: Secondary | ICD-10-CM | POA: Diagnosis not present

## 2017-12-06 DIAGNOSIS — I340 Nonrheumatic mitral (valve) insufficiency: Secondary | ICD-10-CM | POA: Diagnosis not present

## 2017-12-06 DIAGNOSIS — I4891 Unspecified atrial fibrillation: Secondary | ICD-10-CM | POA: Diagnosis not present

## 2017-12-06 DIAGNOSIS — J90 Pleural effusion, not elsewhere classified: Secondary | ICD-10-CM | POA: Diagnosis not present

## 2017-12-06 DIAGNOSIS — Z87891 Personal history of nicotine dependence: Secondary | ICD-10-CM | POA: Diagnosis not present

## 2017-12-06 DIAGNOSIS — Z7901 Long term (current) use of anticoagulants: Secondary | ICD-10-CM | POA: Diagnosis not present

## 2017-12-07 DIAGNOSIS — R931 Abnormal findings on diagnostic imaging of heart and coronary circulation: Secondary | ICD-10-CM | POA: Diagnosis not present

## 2017-12-07 DIAGNOSIS — I340 Nonrheumatic mitral (valve) insufficiency: Secondary | ICD-10-CM | POA: Diagnosis not present

## 2017-12-07 DIAGNOSIS — I480 Paroxysmal atrial fibrillation: Secondary | ICD-10-CM | POA: Diagnosis not present

## 2017-12-07 DIAGNOSIS — J189 Pneumonia, unspecified organism: Secondary | ICD-10-CM | POA: Diagnosis not present

## 2017-12-07 DIAGNOSIS — I351 Nonrheumatic aortic (valve) insufficiency: Secondary | ICD-10-CM | POA: Diagnosis not present

## 2017-12-10 DIAGNOSIS — I4891 Unspecified atrial fibrillation: Secondary | ICD-10-CM | POA: Diagnosis not present

## 2017-12-10 DIAGNOSIS — R002 Palpitations: Secondary | ICD-10-CM | POA: Diagnosis not present

## 2017-12-10 DIAGNOSIS — I420 Dilated cardiomyopathy: Secondary | ICD-10-CM | POA: Diagnosis not present

## 2017-12-10 DIAGNOSIS — R0602 Shortness of breath: Secondary | ICD-10-CM | POA: Diagnosis not present

## 2017-12-10 DIAGNOSIS — I10 Essential (primary) hypertension: Secondary | ICD-10-CM | POA: Diagnosis not present

## 2017-12-27 DIAGNOSIS — I420 Dilated cardiomyopathy: Secondary | ICD-10-CM | POA: Diagnosis not present

## 2017-12-27 DIAGNOSIS — R002 Palpitations: Secondary | ICD-10-CM | POA: Diagnosis not present

## 2017-12-27 DIAGNOSIS — I10 Essential (primary) hypertension: Secondary | ICD-10-CM | POA: Diagnosis not present

## 2017-12-27 DIAGNOSIS — R0602 Shortness of breath: Secondary | ICD-10-CM | POA: Diagnosis not present

## 2017-12-27 DIAGNOSIS — I4891 Unspecified atrial fibrillation: Secondary | ICD-10-CM | POA: Diagnosis not present

## 2017-12-27 DIAGNOSIS — I4892 Unspecified atrial flutter: Secondary | ICD-10-CM | POA: Diagnosis not present

## 2017-12-31 DIAGNOSIS — H2513 Age-related nuclear cataract, bilateral: Secondary | ICD-10-CM | POA: Diagnosis not present

## 2018-01-04 DIAGNOSIS — I4891 Unspecified atrial fibrillation: Secondary | ICD-10-CM | POA: Diagnosis not present

## 2018-01-05 ENCOUNTER — Other Ambulatory Visit: Payer: Self-pay

## 2018-01-05 ENCOUNTER — Emergency Department: Payer: Medicare Other

## 2018-01-05 ENCOUNTER — Encounter: Payer: Self-pay | Admitting: Emergency Medicine

## 2018-01-05 ENCOUNTER — Emergency Department
Admission: EM | Admit: 2018-01-05 | Discharge: 2018-01-05 | Disposition: A | Discharge status: Home / Self Care | Payer: Medicare Other | Attending: Student in an Organized Health Care Education/Training Program | Admitting: Student in an Organized Health Care Education/Training Program

## 2018-01-05 ENCOUNTER — Telehealth: Payer: Self-pay

## 2018-01-05 ENCOUNTER — Ambulatory Visit (INDEPENDENT_AMBULATORY_CARE_PROVIDER_SITE_OTHER): Payer: Medicare Other | Admitting: Family Medicine

## 2018-01-05 VITALS — BP 129/86 | HR 128 | Temp 98.0°F | Wt 227.0 lb

## 2018-01-05 DIAGNOSIS — R0602 Shortness of breath: Secondary | ICD-10-CM | POA: Diagnosis not present

## 2018-01-05 DIAGNOSIS — Z7901 Long term (current) use of anticoagulants: Secondary | ICD-10-CM | POA: Insufficient documentation

## 2018-01-05 DIAGNOSIS — R0600 Dyspnea, unspecified: Secondary | ICD-10-CM

## 2018-01-05 DIAGNOSIS — Z87891 Personal history of nicotine dependence: Secondary | ICD-10-CM | POA: Diagnosis not present

## 2018-01-05 DIAGNOSIS — F3341 Major depressive disorder, recurrent, in partial remission: Secondary | ICD-10-CM

## 2018-01-05 DIAGNOSIS — I10 Essential (primary) hypertension: Secondary | ICD-10-CM | POA: Insufficient documentation

## 2018-01-05 DIAGNOSIS — R0609 Other forms of dyspnea: Secondary | ICD-10-CM | POA: Insufficient documentation

## 2018-01-05 DIAGNOSIS — Z79899 Other long term (current) drug therapy: Secondary | ICD-10-CM | POA: Diagnosis not present

## 2018-01-05 DIAGNOSIS — Z96651 Presence of right artificial knee joint: Secondary | ICD-10-CM | POA: Insufficient documentation

## 2018-01-05 DIAGNOSIS — I4891 Unspecified atrial fibrillation: Secondary | ICD-10-CM

## 2018-01-05 DIAGNOSIS — I4821 Permanent atrial fibrillation: Secondary | ICD-10-CM | POA: Diagnosis not present

## 2018-01-05 DIAGNOSIS — I4892 Unspecified atrial flutter: Secondary | ICD-10-CM | POA: Diagnosis not present

## 2018-01-05 LAB — COMPREHENSIVE METABOLIC PANEL
ALT: 47 U/L — ABNORMAL HIGH (ref 0–44)
AST: 34 U/L (ref 15–41)
Albumin: 3.9 g/dL (ref 3.5–5.0)
Alkaline Phosphatase: 78 U/L (ref 38–126)
Anion gap: 8 (ref 5–15)
BUN: 20 mg/dL (ref 8–23)
CO2: 28 mmol/L (ref 22–32)
Calcium: 9.3 mg/dL (ref 8.9–10.3)
Chloride: 105 mmol/L (ref 98–111)
Creatinine, Ser: 0.97 mg/dL (ref 0.61–1.24)
GFR calc Af Amer: >60 mL/min (ref >60)
GFR calc non Af Amer: >60 mL/min (ref >60)
Glucose, Bld: 100 mg/dL — ABNORMAL HIGH (ref 70–99)
Potassium: 4.5 mmol/L (ref 3.5–5.1)
Sodium: 141 mmol/L (ref 135–145)
Total Bilirubin: 2.1 mg/dL — ABNORMAL HIGH (ref 0.3–1.2)
Total Protein: 7.1 g/dL (ref 6.5–8.1)

## 2018-01-05 LAB — CBC WITH DIFFERENTIAL/PLATELET
Basophils Absolute: 0.1 10*3/uL (ref 0–0.1)
Basophils Relative: 1 %
Eosinophils Absolute: 0.7 10*3/uL (ref 0–0.7)
Eosinophils Relative: 9 %
HCT: 39.7 % — ABNORMAL LOW (ref 40.0–52.0)
Hemoglobin: 14.1 g/dL (ref 13.0–18.0)
Lymphocytes Relative: 28 %
Lymphs Abs: 2.2 10*3/uL (ref 1.0–3.6)
MCH: 31.4 pg (ref 26.0–34.0)
MCHC: 35.5 g/dL (ref 32.0–36.0)
MCV: 88.5 fL (ref 80.0–100.0)
Monocytes Absolute: 0.7 10*3/uL (ref 0.2–1.0)
Monocytes Relative: 9 %
Neutro Abs: 4.1 10*3/uL (ref 1.4–6.5)
Neutrophils Relative %: 53 %
Platelets: 247 10*3/uL (ref 150–440)
RBC: 4.49 MIL/uL (ref 4.40–5.90)
RDW: 14.8 % — ABNORMAL HIGH (ref 11.5–14.5)
WBC: 7.8 10*3/uL (ref 3.8–10.6)

## 2018-01-05 LAB — TROPONIN I: Troponin I: <0.03 ng/mL (ref <0.03)

## 2018-01-05 LAB — BRAIN NATRIURETIC PEPTIDE: B Natriuretic Peptide: 618 pg/mL — ABNORMAL HIGH (ref 0.0–100.0)

## 2018-01-05 MED ORDER — FUROSEMIDE 10 MG/ML IJ SOLN
40.0000 mg | Freq: Once | INTRAMUSCULAR | Status: AC
  Administered 2018-01-05: 40 mg via INTRAVENOUS
  Filled 2018-01-05: qty 4

## 2018-01-05 MED ORDER — METOPROLOL SUCCINATE ER 50 MG PO TB24
50.0000 mg | ORAL_TABLET | Freq: Every day | ORAL | Status: DC
  Administered 2018-01-05: 50 mg via ORAL
  Filled 2018-01-05: qty 1

## 2018-01-05 MED ORDER — MAGNESIUM SULFATE 2 GM/50ML IV SOLN
2.0000 g | Freq: Once | INTRAVENOUS | Status: AC
  Administered 2018-01-05: 2 g via INTRAVENOUS
  Filled 2018-01-05: qty 50

## 2018-01-05 MED ORDER — ALBUTEROL SULFATE (2.5 MG/3ML) 0.083% IN NEBU
5.0000 mg | INHALATION_SOLUTION | Freq: Once | RESPIRATORY_TRACT | Status: AC
  Administered 2018-01-05: 5 mg via RESPIRATORY_TRACT
  Filled 2018-01-05: qty 6

## 2018-01-05 MED ORDER — METOPROLOL TARTRATE 5 MG/5ML IV SOLN
2.5000 mg | Freq: Once | INTRAVENOUS | Status: AC
  Administered 2018-01-05: 2.5 mg via INTRAVENOUS
  Filled 2018-01-05: qty 5

## 2018-01-05 NOTE — Progress Notes (Signed)
Jeffrey French  MRN: [id number] DOB: 1950-04-19  Subjective:  HPI   The patient is a 67 year old male who presents today with complaint of shortness of breath.  He states he has been being seen by Cardiology for atrial fibrillation.    The shortness of breath woke him up from the sleep this morning.  He is also had a mild cough today.  No chest pain but he is feeling weak and he is unsteady in the office today. During the last month he has had increasing symptoms and is scheduled for ablation in November.  He has also been seen in [place] ER on 12/06/17 for what the patient states he was told was aspiration pneumonia.   The patient was seen by cardiology yesterday for shortness of breath but states that today he feels different.  He has not contacted them because he thinks this could be from the pneumonia.  Patient Active Problem List   Diagnosis Date Noted  . New onset atrial flutter ([place]) 09/18/2015  . Irregular heart rhythm 09/17/2015  . Absolute anemia 02/18/2015  . Benign fibroma of prostate 02/18/2015  . Clinical depression 02/18/2015  . Abnormal prostate specific antigen 02/18/2015  . Essential (primary) hypertension 02/18/2015  . Anxiety, generalized 02/18/2015  . Cannot sleep 02/18/2015  . Arthritis, degenerative 02/18/2015  . Peptic ulcer 02/18/2015  . Elevated prostate specific antigen (PSA) 10/19/2013    Past Medical History:  Diagnosis Date  . BPH (benign prostatic hyperplasia)   . GERD (gastroesophageal reflux disease)   . Hypertension     Social History   Socioeconomic History  . Marital status: Married    Spouse name: Not on file  . Number of children: 3  . Years of education: Not on file  . Highest education level: Bachelor's degree (e.g., BA, AB, BS)  Occupational History  . Occupation: retired  Scientific laboratory technician  . Financial resource strain: Not hard at all  . Food insecurity:    Worry: Never true    Inability: Never true  . Transportation needs:   Medical: No    Non-medical: No  Tobacco Use  . Smoking status: Former Smoker    Types: Cigarettes  . Smokeless tobacco: Never Used  . Tobacco comment: 1974  Substance and Sexual Activity  . Alcohol use: Yes    Alcohol/week: 0.0 - 2.0 standard drinks  . Drug use: No  . Sexual activity: Not on file  Lifestyle  . Physical activity:    Days per week: Not on file    Minutes per session: Not on file  . Stress: Not at all  Relationships  . Social connections:    Talks on phone: Not on file    Gets together: Not on file    Attends religious service: Not on file    Active member of club or organization: Not on file    Attends meetings of clubs or organizations: Not on file    Relationship status: Not on file  . Intimate partner violence:    Fear of current or ex partner: No    Emotionally abused: No    Physically abused: No    Forced sexual activity: No  Other Topics Concern  . Not on file  Social History Narrative  . Not on file    Outpatient Encounter Medications as of 01/05/2018  Medication Sig Note  . alprazolam (XANAX) 2 MG tablet Take 0.5 tablets (1 mg total) by mouth as needed.   Marland Kitchen apixaban (ELIQUIS)  5 MG TABS tablet Take 5 mg by mouth 2 (two) times daily.   Marland Kitchen buPROPion (WELLBUTRIN XL) 300 MG 24 hr tablet TAKE 1 TABLET BY MOUTH EVERY DAY.   Marland Kitchen Cholecalciferol (VITAMIN D) 2000 UNITS CAPS Take 1 capsule by mouth daily.  02/18/2015: Received from: [facility]  . donepezil (ARICEPT) 5 MG tablet Take 5 mg by mouth daily.   . finasteride (PROSCAR) 5 MG tablet Take 1 tablet (5 mg total) by mouth daily.   . flecainide (TAMBOCOR) 50 MG tablet Take 50 mg by mouth.   . folic acid (FOLVITE) 741 MCG tablet Take 800 mcg by mouth daily.  02/18/2015: Received from: [facility]  . Melatonin 1 MG TABS Take 2 mg by mouth at bedtime.  02/18/2015: Received from: [facility]  . metoprolol tartrate (LOPRESSOR) 50 MG tablet Take 50 mg by mouth  2 (two) times daily.   Marland Kitchen omeprazole (PRILOSEC) 20 MG capsule TAKE 1 CAPSULE BY MOUTH EVERY DAY.   . tamsulosin (FLOMAX) 0.4 MG CAPS capsule Take 1 capsule (0.4 mg total) by mouth daily.   . temazepam (RESTORIL) 30 MG capsule TAKE 1 CAPSULE BY MOUTH EVERY DAY FOR SLEEP   . venlafaxine XR (EFFEXOR-XR) 75 MG 24 hr capsule TAKE THREE CAPSULES BY MOUTH EVERY MORNING   . metoprolol succinate (TOPROL-XL) 50 MG 24 hr tablet TAKE 1 TABLET BY MOUTH DAILY. (Patient not taking: Reported on 01/05/2018)   . [DISCONTINUED] amLODipine (NORVASC) 5 MG tablet TAKE 1 TABLET BY MOUTH EVERY DAY.   . [DISCONTINUED] amoxicillin (AMOXIL) 500 MG capsule Take 2 capsules (1,000 mg total) by mouth 2 (two) times daily.    No facility-administered encounter medications on file as of 01/05/2018.     Allergies  Allergen Reactions  . Sulfamethoxazole-Trimethoprim     Review of Systems  Constitutional: Positive for malaise/fatigue.  HENT: Negative.   Eyes: Negative.   Respiratory: Positive for cough and shortness of breath.   Cardiovascular: Positive for orthopnea. Negative for chest pain, palpitations and leg swelling.  Gastrointestinal: Negative.   Genitourinary: Negative.   Skin: Negative.   Neurological: Negative.   Endo/Heme/Allergies: Negative.   Psychiatric/Behavioral: Positive for depression.       Stable per pt--partial remission.    Objective:  BP 129/86 (BP Location: Right Arm, Patient Position: Sitting, Cuff Size: Normal)   Pulse (!) 128   Temp 98 F (36.7 C) (Oral)   Wt 227 lb (103 kg)   SpO2 97%   BMI 28.37 kg/m   Physical Exam  Constitutional: He is oriented to person, place, and time and well-developed, well-nourished, and in no distress.  HENT:  Head: Normocephalic.  Eyes: Conjunctivae are normal. No scleral icterus.  Neck: No JVD present. No thyromegaly present.  Cardiovascular: Normal rate, regular rhythm and normal heart sounds.  Tachycardic.  Pulmonary/Chest: Effort normal and breath  sounds normal.  Musculoskeletal: He exhibits no edema.  Neurological: He is alert and oriented to person, place, and time. Gait normal. GCS score is 15.  Skin: Skin is dry.  Psychiatric: Memory, affect and judgment normal.    Assessment and Plan :  1. Shortness of breath Patient woke up with orthopnea this morning.  He is not hypoxic but I am afraid this is a little heart failure and could lead to some problems for him in the next day or 2.  Think he needs an evaluation in the ED possibly with cardiology to make sure he is safe to be managed at  home.  I personally transported the patient to the ED. - EKG 12-Lead  2. New onset atrial flutter ([place])   3. Essential (primary) hypertension   4. Recurrent major depressive disorder, in partial remission ([place])   5. Atrial fibrillation, unspecified type ([place]) Rate between 110 and 140 in the office today.  He has seen EP at [facility].  I have done the exam and reviewed the chart and it is accurate to the best of my knowledge. Development worker, community has been used and  any errors in dictation or transcription are unintentional. Miguel Aschoff M.D. [facility]

## 2018-01-05 NOTE — Discharge Instructions (Addendum)
Please start taking 100 mg of Toprol XL in the morning and 50 at night.  Please contact Dr. Verlin Fester for close outpatient follow-up.  Return for any worsening shortness of breath, chest pain or for any additional questions or concerns.

## 2018-01-05 NOTE — Telephone Encounter (Signed)
FYI...  Pt called complaining of shortness of breath and some coughing.  He states he had pneumonia about a month ago and was feeling better until this morning.  He woke up feeling more short of breath and also a "Slight" cough.  He denies fevers, chills, nausea, vomiting, chest pains, heart palpitations.    Jeffrey French has an appointment with you for 11am this morning.  I advised him if he has worsening or new symptoms to call 911 or go straight to the ER.  He agreed with the plan.  Thanks,   -Mickel Baas

## 2018-01-05 NOTE — ED Triage Notes (Signed)
Pt here with c/o sob, and weakness over the past few days, appears in NAD, brought over by Dr at [facility] [address]

## 2018-01-05 NOTE — ED Provider Notes (Signed)
[facility] Emergency Department Provider Note    First MD Initiated Contact with Patient 01/05/18 1351     (approximate)  I have reviewed the triage vital signs and the nursing notes.   HISTORY  Chief Complaint Shortness of Breath and Weakness    HPI Jeffrey French is a 67 y.o. male presents from primary care facility for evaluation of shortness of breath and weakness over the past few days.  Has endorse some orthopnea.  Denies any chest pain.  Is been struggling with chronic A. fib for several months.  Denies any nausea or vomiting.  No chest pressure.  Is being evaluated for EP evaluation for possible ablation.    Echo 2019 MILD LV DYSFUNCTION (See above) WITH MILD LVH NORMAL RIGHT VENTRICULAR SYSTOLIC FUNCTION VALVULAR REGURGITATION: MODERATE AR, MILD MR, TRIVIAL PR, TRIVIAL TR NO VALVULAR STENOSIS ESTIMATED RVSP=83mmHg MODERATE AORTIC REGURGITATION LVEF LOWER LIMITS OF NORMAL ARRHYTHMIA WITH FREQUENT ECTOPY NO PRIOR STUDY FOR COMPARISON  Past Medical History:  Diagnosis Date  . BPH (benign prostatic hyperplasia)   . GERD (gastroesophageal reflux disease)   . Hypertension    Family History  Problem Relation Age of Onset  . Arthritis Mother   . Hypertension Mother   . Colon cancer Mother   . Other Mother        lymph node  . Diabetes Father   . Congestive Heart Failure Father   . Arthritis Sister    Past Surgical History:  Procedure Laterality Date  . ELECTROPHYSIOLOGIC STUDY N/A 10/09/2015   Procedure: Cardioversion;  Surgeon: Isaias Cowman, MD;  Location: [facility] ORS;  Service: Cardiovascular;  Laterality: N/A;  . NASAL RECONSTRUCTION WITH SEPTAL REPAIR    . REPLACEMENT TOTAL KNEE Right   . SHOULDER SURGERY Left   . TONSILLECTOMY     Patient Active Problem List   Diagnosis Date Noted  . Atrial fibrillation ([place]) 01/05/2018  . New onset atrial flutter ([place]) 09/18/2015  . Irregular heart rhythm 09/17/2015  .  Absolute anemia 02/18/2015  . Benign fibroma of prostate 02/18/2015  . Clinical depression 02/18/2015  . Abnormal prostate specific antigen 02/18/2015  . Essential (primary) hypertension 02/18/2015  . Anxiety, generalized 02/18/2015  . Cannot sleep 02/18/2015  . Arthritis, degenerative 02/18/2015  . Peptic ulcer 02/18/2015  . Elevated prostate specific antigen (PSA) 10/19/2013      Prior to Admission medications   Medication Sig Start Date End Date Taking? Authorizing Provider  alprazolam Duanne Moron) 2 MG tablet Take 0.5 tablets (1 mg total) by mouth as needed. 09/30/17   Jerrol Banana., MD  apixaban (ELIQUIS) 5 MG TABS tablet Take 5 mg by mouth 2 (two) times daily.    [provider]  buPROPion (WELLBUTRIN XL) 300 MG 24 hr tablet TAKE 1 TABLET BY MOUTH EVERY DAY. 06/08/17   Jerrol Banana., MD  Cholecalciferol (VITAMIN D) 2000 UNITS CAPS Take 1 capsule by mouth daily.  02/10/11   [provider]  donepezil (ARICEPT) 5 MG tablet Take 5 mg by mouth daily. 03/08/17   [provider]  finasteride (PROSCAR) 5 MG tablet Take 1 tablet (5 mg total) by mouth daily. 11/16/17   Jerrol Banana., MD  flecainide (TAMBOCOR) 50 MG tablet Take 50 mg by mouth.    [provider]  folic acid (FOLVITE) 825 MCG tablet Take 800 mcg by mouth daily.  02/10/11   [provider]  Melatonin 1 MG TABS Take 2 mg by mouth at  bedtime.     [provider]  metoprolol succinate (TOPROL-XL) 50 MG 24 hr tablet TAKE 1 TABLET BY MOUTH DAILY. Patient not taking: Reported on 01/05/2018 10/27/17   Jerrol Banana., MD  metoprolol tartrate (LOPRESSOR) 50 MG tablet Take 50 mg by mouth 2 (two) times daily.    [provider]  omeprazole (PRILOSEC) 20 MG capsule TAKE 1 CAPSULE BY MOUTH EVERY DAY. 07/27/17   Jerrol Banana., MD  tamsulosin (FLOMAX) 0.4 MG CAPS capsule Take 1 capsule (0.4 mg total) by mouth daily. 11/16/17   Jerrol Banana.,  MD  temazepam (RESTORIL) 30 MG capsule TAKE 1 CAPSULE BY MOUTH EVERY DAY FOR SLEEP 11/16/17   Jerrol Banana., MD  venlafaxine XR (EFFEXOR-XR) 75 MG 24 hr capsule TAKE THREE CAPSULES BY MOUTH EVERY MORNING 08/31/17   Jerrol Banana., MD    Allergies Sulfamethoxazole-trimethoprim    Social History Social History   Tobacco Use  . Smoking status: Former Smoker    Types: Cigarettes  . Smokeless tobacco: Never Used  . Tobacco comment: 1974  Substance Use Topics  . Alcohol use: Yes    Alcohol/week: 0.0 - 2.0 standard drinks  . Drug use: No    Review of Systems Patient denies headaches, rhinorrhea, blurry vision, numbness, shortness of breath, chest pain, edema, cough, abdominal pain, nausea, vomiting, diarrhea, dysuria, fevers, rashes or hallucinations unless otherwise stated above in HPI. ____________________________________________   PHYSICAL EXAM:  VITAL SIGNS: Vitals:   01/05/18 1645 01/05/18 1700  BP: 127/76 128/84  Pulse: 92 92  Resp: (!) 22 17  Temp:    SpO2: 98% 98%    Constitutional: Alert and oriented.  Eyes: Conjunctivae are normal.  Head: Atraumatic. Nose: No congestion/rhinnorhea. Mouth/Throat: Mucous membranes are moist.   Neck: No stridor. Painless ROM.  Cardiovascular: irregularly irregular rhythm. Grossly normal heart sounds.  Good peripheral circulation. Respiratory: Normal respiratory effort.  No retractions. Lungs CTAB. Gastrointestinal: Soft and nontender. No distention. No abdominal bruits. No CVA tenderness. Genitourinary:  Musculoskeletal: No lower extremity tenderness nor edema.  No joint effusions. Neurologic:  Normal speech and language. No gross focal neurologic deficits are appreciated. No facial droop Skin:  Skin is warm, dry and intact. No rash noted. Psychiatric: Mood and affect are normal. Speech and behavior are normal.  ____________________________________________   LABS (all labs ordered are listed, but only abnormal  results are displayed)  Results for orders placed or performed during the hospital encounter of 01/05/18 (from the past 24 hour(s))  Comprehensive metabolic panel     Status: Abnormal   Collection Time: 01/05/18 12:57 PM  Result Value Ref Range   Sodium 141 135 - 145 mmol/L   Potassium 4.5 3.5 - 5.1 mmol/L   Chloride 105 98 - 111 mmol/L   CO2 28 22 - 32 mmol/L   Glucose, Bld 100 (H) 70 - 99 mg/dL   BUN 20 8 - 23 mg/dL   Creatinine, Ser 0.97 0.61 - 1.24 mg/dL   Calcium 9.3 8.9 - 10.3 mg/dL   Total Protein 7.1 6.5 - 8.1 g/dL   Albumin 3.9 3.5 - 5.0 g/dL   AST 34 15 - 41 U/L   ALT 47 (H) 0 - 44 U/L   Alkaline Phosphatase 78 38 - 126 U/L   Total Bilirubin 2.1 (H) 0.3 - 1.2 mg/dL   GFR calc non Af Amer >60 >60 mL/min   GFR calc Af Amer >60 >60 mL/min   Anion gap  8 5 - 15  CBC with Differential/Platelet     Status: Abnormal   Collection Time: 01/05/18 12:57 PM  Result Value Ref Range   WBC 7.8 3.8 - 10.6 K/uL   RBC 4.49 4.40 - 5.90 MIL/uL   Hemoglobin 14.1 13.0 - 18.0 g/dL   HCT 39.7 (L) 40.0 - 52.0 %   MCV 88.5 80.0 - 100.0 fL   MCH 31.4 26.0 - 34.0 pg   MCHC 35.5 32.0 - 36.0 g/dL   RDW 14.8 (H) 11.5 - 14.5 %   Platelets 247 150 - 440 K/uL   Neutrophils Relative % 53 %   Neutro Abs 4.1 1.4 - 6.5 K/uL   Lymphocytes Relative 28 %   Lymphs Abs 2.2 1.0 - 3.6 K/uL   Monocytes Relative 9 %   Monocytes Absolute 0.7 0.2 - 1.0 K/uL   Eosinophils Relative 9 %   Eosinophils Absolute 0.7 0 - 0.7 K/uL   Basophils Relative 1 %   Basophils Absolute 0.1 0 - 0.1 K/uL  Troponin I     Status: None   Collection Time: 01/05/18 12:57 PM  Result Value Ref Range   Troponin I <0.03 <0.03 ng/mL  Brain natriuretic peptide     Status: Abnormal   Collection Time: 01/05/18 12:57 PM  Result Value Ref Range   B Natriuretic Peptide 618.0 (H) 0.0 - 100.0 pg/mL   ____________________________________________  EKG My review and personal interpretation at Time: 12:56   Indication: sob  Rate: 100   Rhythm: afib Axis: normal Other: nonspeciific st abn, no stemi ____________________________________________  RADIOLOGY  I personally reviewed all radiographic images ordered to evaluate for the above acute complaints and reviewed radiology reports and findings.  These findings were personally discussed with the patient.  Please see medical record for radiology report.  ____________________________________________   PROCEDURES  Procedure(s) performed:  Procedures    Critical Care performed: no ____________________________________________   INITIAL IMPRESSION / ASSESSMENT AND PLAN / ED COURSE  Pertinent labs & imaging results that were available during my care of the patient were reviewed by me and considered in my medical decision making (see chart for details).   DDX: Asthma, copd, CHF, pna, ptx, malignancy, Pe, anemia   DAIKI DICOSTANZO is a 67 y.o. who presents to the ED with symptoms as described above.  Patient nontoxic-appearing.  Does have mildly fast A. fib.  No evidence of ischemia.  No symptoms to suggest ACS.  Could have component of underlying point vascular congestion based on chest x-ray.  Possibly having some mild congestion secondary to chronic A. fib.  Will touch base with cardiology check blood work and reassess.  Clinical Course as of Jan 05 1722  Wed Jan 05, 2018  1511 Patient's BNP is mildly elevated at 600.  No hypoxia also has mildly fast A. fib in the low 100s.  Will give dose of Lasix as well as reduce metoprolol with XL and small dose of IV to improve rate control as this may be contributing to his volume status.  We will touch base with cardiology for further recommendations.   [PR]  1448 Discussed case with cardiology who agrees with small dose of IV Lasix and increasing Toprol.  Will reassess.   [PR]  1623 Patient has had adequate diuresis.  Cardiac monitor shows and with a heart rate of averaging mid 90s to low 80s.  Will ambulate.  We will also give IV  magnesium to help with additional rate control.   [PR]  1713 Despite documentation of heart rates in the 1 teens the patient is currently a heart rate of 90 and has been persistently in the high 80s to low 90s.   [PR]  2637 Patient was able to ambulate with any significant tachycardia or hypoxia.  Has had adequate diuresis.  At this point believe he stable and appropriate for outpatient follow-up.   [PR]    Clinical Course User Index [PR] Merlyn Lot, MD     As part of my medical decision making, I reviewed the following data within the [place] notes reviewed and incorporated, Labs reviewed, notes from prior ED visits and [place] Controlled Substance Database   ____________________________________________   FINAL CLINICAL IMPRESSION(S) / ED DIAGNOSES  Final diagnoses:  Permanent atrial fibrillation  Dyspnea on exertion      NEW MEDICATIONS STARTED DURING THIS VISIT:  New Prescriptions   No medications on file     Note:  This document was prepared using Dragon voice recognition software and may include unintentional dictation errors.    Merlyn Lot, MD 01/05/18 1723

## 2018-01-13 DIAGNOSIS — Z23 Encounter for immunization: Secondary | ICD-10-CM | POA: Diagnosis not present

## 2018-02-15 DIAGNOSIS — G319 Degenerative disease of nervous system, unspecified: Secondary | ICD-10-CM | POA: Diagnosis not present

## 2018-02-15 DIAGNOSIS — I420 Dilated cardiomyopathy: Secondary | ICD-10-CM | POA: Diagnosis not present

## 2018-02-15 DIAGNOSIS — Z8679 Personal history of other diseases of the circulatory system: Secondary | ICD-10-CM | POA: Diagnosis not present

## 2018-02-15 DIAGNOSIS — I10 Essential (primary) hypertension: Secondary | ICD-10-CM | POA: Diagnosis not present

## 2018-02-15 DIAGNOSIS — R0602 Shortness of breath: Secondary | ICD-10-CM | POA: Diagnosis not present

## 2018-02-15 DIAGNOSIS — Z79899 Other long term (current) drug therapy: Secondary | ICD-10-CM | POA: Diagnosis not present

## 2018-02-15 DIAGNOSIS — R002 Palpitations: Secondary | ICD-10-CM | POA: Diagnosis not present

## 2018-02-15 DIAGNOSIS — G4730 Sleep apnea, unspecified: Secondary | ICD-10-CM | POA: Diagnosis not present

## 2018-02-15 DIAGNOSIS — Z7901 Long term (current) use of anticoagulants: Secondary | ICD-10-CM | POA: Diagnosis not present

## 2018-02-15 DIAGNOSIS — Z01810 Encounter for preprocedural cardiovascular examination: Secondary | ICD-10-CM | POA: Diagnosis not present

## 2018-02-15 DIAGNOSIS — I4891 Unspecified atrial fibrillation: Secondary | ICD-10-CM | POA: Diagnosis not present

## 2018-02-16 DIAGNOSIS — I483 Typical atrial flutter: Secondary | ICD-10-CM | POA: Diagnosis not present

## 2018-02-16 DIAGNOSIS — I493 Ventricular premature depolarization: Secondary | ICD-10-CM | POA: Diagnosis not present

## 2018-02-16 DIAGNOSIS — I10 Essential (primary) hypertension: Secondary | ICD-10-CM | POA: Diagnosis not present

## 2018-02-16 DIAGNOSIS — I4819 Other persistent atrial fibrillation: Secondary | ICD-10-CM | POA: Diagnosis not present

## 2018-02-16 DIAGNOSIS — I4891 Unspecified atrial fibrillation: Secondary | ICD-10-CM | POA: Diagnosis not present

## 2018-02-16 DIAGNOSIS — Z79899 Other long term (current) drug therapy: Secondary | ICD-10-CM | POA: Diagnosis not present

## 2018-02-17 DIAGNOSIS — I493 Ventricular premature depolarization: Secondary | ICD-10-CM | POA: Diagnosis not present

## 2018-02-17 DIAGNOSIS — I4891 Unspecified atrial fibrillation: Secondary | ICD-10-CM | POA: Diagnosis not present

## 2018-02-17 DIAGNOSIS — I483 Typical atrial flutter: Secondary | ICD-10-CM | POA: Diagnosis not present

## 2018-02-17 DIAGNOSIS — Z79899 Other long term (current) drug therapy: Secondary | ICD-10-CM | POA: Diagnosis not present

## 2018-02-17 DIAGNOSIS — Z8679 Personal history of other diseases of the circulatory system: Secondary | ICD-10-CM | POA: Insufficient documentation

## 2018-02-17 DIAGNOSIS — Z9889 Other specified postprocedural states: Secondary | ICD-10-CM | POA: Insufficient documentation

## 2018-02-17 DIAGNOSIS — I4819 Other persistent atrial fibrillation: Secondary | ICD-10-CM | POA: Diagnosis not present

## 2018-02-21 DIAGNOSIS — I420 Dilated cardiomyopathy: Secondary | ICD-10-CM | POA: Diagnosis not present

## 2018-02-21 DIAGNOSIS — Z8679 Personal history of other diseases of the circulatory system: Secondary | ICD-10-CM | POA: Diagnosis not present

## 2018-02-21 DIAGNOSIS — Z9889 Other specified postprocedural states: Secondary | ICD-10-CM | POA: Diagnosis not present

## 2018-02-21 DIAGNOSIS — R002 Palpitations: Secondary | ICD-10-CM | POA: Diagnosis not present

## 2018-02-21 DIAGNOSIS — I4891 Unspecified atrial fibrillation: Secondary | ICD-10-CM | POA: Diagnosis not present

## 2018-02-21 DIAGNOSIS — R0602 Shortness of breath: Secondary | ICD-10-CM | POA: Diagnosis not present

## 2018-02-21 DIAGNOSIS — I10 Essential (primary) hypertension: Secondary | ICD-10-CM | POA: Diagnosis not present

## 2018-02-25 DIAGNOSIS — Z8679 Personal history of other diseases of the circulatory system: Secondary | ICD-10-CM | POA: Diagnosis not present

## 2018-02-25 DIAGNOSIS — R002 Palpitations: Secondary | ICD-10-CM | POA: Diagnosis not present

## 2018-02-25 DIAGNOSIS — R0602 Shortness of breath: Secondary | ICD-10-CM | POA: Diagnosis not present

## 2018-02-25 DIAGNOSIS — I4891 Unspecified atrial fibrillation: Secondary | ICD-10-CM | POA: Diagnosis not present

## 2018-02-25 DIAGNOSIS — I480 Paroxysmal atrial fibrillation: Secondary | ICD-10-CM | POA: Diagnosis not present

## 2018-02-25 DIAGNOSIS — Z9889 Other specified postprocedural states: Secondary | ICD-10-CM | POA: Diagnosis not present

## 2018-02-25 DIAGNOSIS — I4892 Unspecified atrial flutter: Secondary | ICD-10-CM | POA: Diagnosis not present

## 2018-02-25 DIAGNOSIS — I10 Essential (primary) hypertension: Secondary | ICD-10-CM | POA: Diagnosis not present

## 2018-02-25 DIAGNOSIS — I420 Dilated cardiomyopathy: Secondary | ICD-10-CM | POA: Diagnosis not present

## 2018-02-26 DIAGNOSIS — F411 Generalized anxiety disorder: Secondary | ICD-10-CM | POA: Diagnosis present

## 2018-02-26 DIAGNOSIS — I43 Cardiomyopathy in diseases classified elsewhere: Secondary | ICD-10-CM | POA: Diagnosis not present

## 2018-02-26 DIAGNOSIS — J9811 Atelectasis: Secondary | ICD-10-CM | POA: Diagnosis not present

## 2018-02-26 DIAGNOSIS — J984 Other disorders of lung: Secondary | ICD-10-CM | POA: Diagnosis present

## 2018-02-26 DIAGNOSIS — I420 Dilated cardiomyopathy: Secondary | ICD-10-CM | POA: Diagnosis present

## 2018-02-26 DIAGNOSIS — Z79899 Other long term (current) drug therapy: Secondary | ICD-10-CM | POA: Diagnosis not present

## 2018-02-26 DIAGNOSIS — D291 Benign neoplasm of prostate: Secondary | ICD-10-CM | POA: Diagnosis present

## 2018-02-26 DIAGNOSIS — I517 Cardiomegaly: Secondary | ICD-10-CM | POA: Diagnosis not present

## 2018-02-26 DIAGNOSIS — R000 Tachycardia, unspecified: Secondary | ICD-10-CM | POA: Diagnosis not present

## 2018-02-26 DIAGNOSIS — I4891 Unspecified atrial fibrillation: Secondary | ICD-10-CM | POA: Diagnosis not present

## 2018-02-26 DIAGNOSIS — I4819 Other persistent atrial fibrillation: Secondary | ICD-10-CM | POA: Diagnosis not present

## 2018-02-26 DIAGNOSIS — G4700 Insomnia, unspecified: Secondary | ICD-10-CM | POA: Diagnosis present

## 2018-02-26 DIAGNOSIS — F329 Major depressive disorder, single episode, unspecified: Secondary | ICD-10-CM | POA: Diagnosis present

## 2018-02-26 DIAGNOSIS — I5023 Acute on chronic systolic (congestive) heart failure: Secondary | ICD-10-CM | POA: Diagnosis not present

## 2018-02-26 DIAGNOSIS — I5021 Acute systolic (congestive) heart failure: Secondary | ICD-10-CM | POA: Diagnosis not present

## 2018-02-26 DIAGNOSIS — Z8659 Personal history of other mental and behavioral disorders: Secondary | ICD-10-CM | POA: Insufficient documentation

## 2018-02-26 DIAGNOSIS — Z9889 Other specified postprocedural states: Secondary | ICD-10-CM | POA: Diagnosis not present

## 2018-02-26 DIAGNOSIS — Z881 Allergy status to other antibiotic agents status: Secondary | ICD-10-CM | POA: Diagnosis not present

## 2018-02-26 DIAGNOSIS — Z7901 Long term (current) use of anticoagulants: Secondary | ICD-10-CM | POA: Diagnosis not present

## 2018-02-26 DIAGNOSIS — I110 Hypertensive heart disease with heart failure: Secondary | ICD-10-CM | POA: Diagnosis not present

## 2018-02-26 DIAGNOSIS — Z8679 Personal history of other diseases of the circulatory system: Secondary | ICD-10-CM | POA: Diagnosis not present

## 2018-02-26 HISTORY — PX: ATRIAL FIBRILLATION ABLATION: EP1191

## 2018-02-27 DIAGNOSIS — Z79899 Other long term (current) drug therapy: Secondary | ICD-10-CM | POA: Insufficient documentation

## 2018-02-28 DIAGNOSIS — I5023 Acute on chronic systolic (congestive) heart failure: Secondary | ICD-10-CM | POA: Insufficient documentation

## 2018-03-08 ENCOUNTER — Ambulatory Visit: Admission: RE | Admit: 2018-03-08 | Payer: Medicare Other | Source: Ambulatory Visit | Admitting: Ophthalmology

## 2018-03-08 ENCOUNTER — Encounter: Admission: RE | Payer: Self-pay | Source: Ambulatory Visit

## 2018-03-08 SURGERY — PHACOEMULSIFICATION, CATARACT, WITH IOL INSERTION
Anesthesia: Choice | Laterality: Left

## 2018-03-17 DIAGNOSIS — Z9889 Other specified postprocedural states: Secondary | ICD-10-CM | POA: Diagnosis not present

## 2018-03-17 DIAGNOSIS — I4819 Other persistent atrial fibrillation: Secondary | ICD-10-CM | POA: Diagnosis not present

## 2018-03-17 DIAGNOSIS — I10 Essential (primary) hypertension: Secondary | ICD-10-CM | POA: Diagnosis not present

## 2018-03-17 DIAGNOSIS — Z79899 Other long term (current) drug therapy: Secondary | ICD-10-CM | POA: Diagnosis not present

## 2018-03-17 DIAGNOSIS — R0602 Shortness of breath: Secondary | ICD-10-CM | POA: Diagnosis not present

## 2018-03-17 DIAGNOSIS — Z8679 Personal history of other diseases of the circulatory system: Secondary | ICD-10-CM | POA: Diagnosis not present

## 2018-03-17 DIAGNOSIS — I4891 Unspecified atrial fibrillation: Secondary | ICD-10-CM | POA: Diagnosis not present

## 2018-03-17 DIAGNOSIS — R002 Palpitations: Secondary | ICD-10-CM | POA: Diagnosis not present

## 2018-03-17 DIAGNOSIS — I420 Dilated cardiomyopathy: Secondary | ICD-10-CM | POA: Diagnosis not present

## 2018-03-17 DIAGNOSIS — I4892 Unspecified atrial flutter: Secondary | ICD-10-CM | POA: Diagnosis not present

## 2018-03-17 DIAGNOSIS — I5023 Acute on chronic systolic (congestive) heart failure: Secondary | ICD-10-CM | POA: Diagnosis not present

## 2018-04-07 DIAGNOSIS — Z8679 Personal history of other diseases of the circulatory system: Secondary | ICD-10-CM | POA: Diagnosis not present

## 2018-04-07 DIAGNOSIS — I4892 Unspecified atrial flutter: Secondary | ICD-10-CM | POA: Diagnosis not present

## 2018-04-07 DIAGNOSIS — I4891 Unspecified atrial fibrillation: Secondary | ICD-10-CM | POA: Diagnosis not present

## 2018-04-07 DIAGNOSIS — R0602 Shortness of breath: Secondary | ICD-10-CM | POA: Diagnosis not present

## 2018-04-07 DIAGNOSIS — I420 Dilated cardiomyopathy: Secondary | ICD-10-CM | POA: Diagnosis not present

## 2018-04-07 DIAGNOSIS — I5023 Acute on chronic systolic (congestive) heart failure: Secondary | ICD-10-CM | POA: Diagnosis not present

## 2018-04-07 DIAGNOSIS — I4819 Other persistent atrial fibrillation: Secondary | ICD-10-CM | POA: Diagnosis not present

## 2018-04-07 DIAGNOSIS — R002 Palpitations: Secondary | ICD-10-CM | POA: Diagnosis not present

## 2018-04-07 DIAGNOSIS — I10 Essential (primary) hypertension: Secondary | ICD-10-CM | POA: Diagnosis not present

## 2018-04-07 DIAGNOSIS — Z79899 Other long term (current) drug therapy: Secondary | ICD-10-CM | POA: Diagnosis not present

## 2018-04-07 DIAGNOSIS — Z9889 Other specified postprocedural states: Secondary | ICD-10-CM | POA: Diagnosis not present

## 2018-04-29 ENCOUNTER — Telehealth: Payer: Self-pay

## 2018-04-29 DIAGNOSIS — G4733 Obstructive sleep apnea (adult) (pediatric): Secondary | ICD-10-CM | POA: Diagnosis not present

## 2018-04-29 NOTE — Telephone Encounter (Signed)
Tried to contact pt to schedule AWV and the there was NANM and then a busy noise. Will try again later. -MM

## 2018-05-02 NOTE — Telephone Encounter (Signed)
Pt declined scheduling the AWV.Pt stated that he did not see any point in the questions asked. FYI to PCP! -MM

## 2018-05-03 DIAGNOSIS — G4733 Obstructive sleep apnea (adult) (pediatric): Secondary | ICD-10-CM | POA: Insufficient documentation

## 2018-05-10 DIAGNOSIS — I4819 Other persistent atrial fibrillation: Secondary | ICD-10-CM | POA: Diagnosis not present

## 2018-05-19 ENCOUNTER — Other Ambulatory Visit: Payer: Self-pay

## 2018-05-19 ENCOUNTER — Encounter: Payer: Self-pay | Admitting: Family Medicine

## 2018-05-19 ENCOUNTER — Other Ambulatory Visit
Admission: RE | Admit: 2018-05-19 | Discharge: 2018-05-19 | Disposition: A | Discharge status: Home / Self Care | Payer: Medicare Other | Source: Ambulatory Visit | Attending: Family Medicine | Admitting: Family Medicine

## 2018-05-19 ENCOUNTER — Ambulatory Visit (INDEPENDENT_AMBULATORY_CARE_PROVIDER_SITE_OTHER): Payer: Medicare Other | Admitting: Family Medicine

## 2018-05-19 VITALS — BP 122/74 | HR 81 | Temp 98.3°F | Ht 75.0 in | Wt 227.2 lb

## 2018-05-19 DIAGNOSIS — Z1211 Encounter for screening for malignant neoplasm of colon: Secondary | ICD-10-CM | POA: Diagnosis not present

## 2018-05-19 DIAGNOSIS — G4709 Other insomnia: Secondary | ICD-10-CM

## 2018-05-19 DIAGNOSIS — R972 Elevated prostate specific antigen [PSA]: Secondary | ICD-10-CM | POA: Insufficient documentation

## 2018-05-19 DIAGNOSIS — M170 Bilateral primary osteoarthritis of knee: Secondary | ICD-10-CM | POA: Diagnosis not present

## 2018-05-19 DIAGNOSIS — I4891 Unspecified atrial fibrillation: Secondary | ICD-10-CM | POA: Insufficient documentation

## 2018-05-19 DIAGNOSIS — I110 Hypertensive heart disease with heart failure: Secondary | ICD-10-CM | POA: Insufficient documentation

## 2018-05-19 DIAGNOSIS — I5023 Acute on chronic systolic (congestive) heart failure: Secondary | ICD-10-CM | POA: Insufficient documentation

## 2018-05-19 DIAGNOSIS — F411 Generalized anxiety disorder: Secondary | ICD-10-CM

## 2018-05-19 DIAGNOSIS — I10 Essential (primary) hypertension: Secondary | ICD-10-CM

## 2018-05-19 DIAGNOSIS — L570 Actinic keratosis: Secondary | ICD-10-CM

## 2018-05-19 DIAGNOSIS — F3341 Major depressive disorder, recurrent, in partial remission: Secondary | ICD-10-CM | POA: Diagnosis not present

## 2018-05-19 DIAGNOSIS — G4733 Obstructive sleep apnea (adult) (pediatric): Secondary | ICD-10-CM | POA: Diagnosis not present

## 2018-05-19 LAB — LIPID PANEL
Cholesterol: 231 mg/dL — ABNORMAL HIGH (ref 0–200)
HDL: 45 mg/dL (ref >40)
LDL Cholesterol: 124 mg/dL — ABNORMAL HIGH (ref 0–99)
Total CHOL/HDL Ratio: 5.1 RATIO
Triglycerides: 309 mg/dL — ABNORMAL HIGH (ref <150)
VLDL: 62 mg/dL — ABNORMAL HIGH (ref 0–40)

## 2018-05-19 LAB — CBC
HCT: 42.7 % (ref 39.0–52.0)
Hemoglobin: 13.7 g/dL (ref 13.0–17.0)
MCH: 28 pg (ref 26.0–34.0)
MCHC: 32.1 g/dL (ref 30.0–36.0)
MCV: 87.3 fL (ref 80.0–100.0)
Platelets: 259 10*3/uL (ref 150–400)
RBC: 4.89 MIL/uL (ref 4.22–5.81)
RDW: 14.2 % (ref 11.5–15.5)
WBC: 7 10*3/uL (ref 4.0–10.5)
nRBC: 0 % (ref 0.0–0.2)

## 2018-05-19 LAB — COMPREHENSIVE METABOLIC PANEL
ALT: 30 U/L (ref 0–44)
AST: 26 U/L (ref 15–41)
Albumin: 3.9 g/dL (ref 3.5–5.0)
Alkaline Phosphatase: 66 U/L (ref 38–126)
Anion gap: 5 (ref 5–15)
BUN: 21 mg/dL (ref 8–23)
CO2: 29 mmol/L (ref 22–32)
Calcium: 9.1 mg/dL (ref 8.9–10.3)
Chloride: 104 mmol/L (ref 98–111)
Creatinine, Ser: 1.19 mg/dL (ref 0.61–1.24)
GFR calc Af Amer: >60 mL/min (ref >60)
GFR calc non Af Amer: >60 mL/min (ref >60)
Glucose, Bld: 98 mg/dL (ref 70–99)
Potassium: 4.8 mmol/L (ref 3.5–5.1)
Sodium: 138 mmol/L (ref 135–145)
Total Bilirubin: 1.2 mg/dL (ref 0.3–1.2)
Total Protein: 7.5 g/dL (ref 6.5–8.1)

## 2018-05-19 LAB — PSA: Prostatic Specific Antigen: 1.14 ng/mL (ref 0.00–4.00)

## 2018-05-19 LAB — TSH: TSH: 2.647 u[IU]/mL (ref 0.350–4.500)

## 2018-05-19 NOTE — Progress Notes (Signed)
Patient: Jeffrey French, Male    DOB: 11/09/1950, 68 y.o.   MRN: [id number] Visit Date: 05/19/2018  Today's Provider: [name], MD   Chief Complaint  Patient presents with  . Annual Exam   Subjective:      Jeffrey French is a 68 y.o. male. He feels well. He reports exercising not all the time. He reports he is sleeping well. He is married and has 3 children.  Legrand Como is 48, dental is 38, Judson Roch is 9.  No grandchildren.  Is now retired. ----------------------------------------------------------- He did have a cardiac ablation at [facility] for A. fib on February 04, 2018.  All issues are stable.  Review of Systems  Constitutional: Positive for fatigue.  HENT: Positive for sinus pressure and tinnitus.   Eyes: Negative.   Respiratory: Positive for apnea.   Cardiovascular: Negative.   Gastrointestinal: Negative.   Endocrine: Negative.   Genitourinary: Negative.   Musculoskeletal: Negative.   Skin: Negative.   Allergic/Immunologic: Negative.   Neurological: Negative.   Hematological: Negative.   Psychiatric/Behavioral: Negative.     Social History   Socioeconomic History  . Marital status: Married    Spouse name: Not on file  . Number of children: 3  . Years of education: Not on file  . Highest education level: Bachelor's degree (e.g., BA, AB, BS)  Occupational History  . Occupation: retired  Scientific laboratory technician  . Financial resource strain: Not hard at all  . Food insecurity:    Worry: Never true    Inability: Never true  . Transportation needs:    Medical: No    Non-medical: No  Tobacco Use  . Smoking status: Former Smoker    Types: Cigarettes  . Smokeless tobacco: Never Used  . Tobacco comment: 1974  Substance and Sexual Activity  . Alcohol use: Yes    Alcohol/week: 0.0 - 2.0 standard drinks  . Drug use: No  . Sexual activity: Not on file  Lifestyle  . Physical activity:    Days per week: Not on file    Minutes per session: Not on file  . Stress:  Not at all  Relationships  . Social connections:    Talks on phone: Not on file    Gets together: Not on file    Attends religious service: Not on file    Active member of club or organization: Not on file    Attends meetings of clubs or organizations: Not on file    Relationship status: Not on file  . Intimate partner violence:    Fear of current or ex partner: No    Emotionally abused: No    Physically abused: No    Forced sexual activity: No  Other Topics Concern  . Not on file  Social History Narrative  . Not on file    Past Medical History:  Diagnosis Date  . BPH (benign prostatic hyperplasia)   . GERD (gastroesophageal reflux disease)   . Hypertension      Patient Active Problem List   Diagnosis Date Noted  . OSA (obstructive sleep apnea) 05/03/2018  . Acute on chronic systolic CHF (congestive heart failure) ([place]) 02/28/2018  . On amiodarone therapy 02/27/2018  . History of depression 02/26/2018  . Status post ablation of atrial fibrillation 02/17/2018  . Atrial fibrillation ([place]) 01/05/2018  . Heart palpitations 10/21/2017  . Diffuse cerebral atrophy ([place]) 03/20/2017  . SOB (shortness of breath) on exertion 03/19/2017  . New onset atrial flutter (  [place]) 09/18/2015  . Irregular heart rhythm 09/17/2015  . Absolute anemia 02/18/2015  . Benign fibroma of prostate 02/18/2015  . Clinical depression 02/18/2015  . Abnormal prostate specific antigen 02/18/2015  . Essential (primary) hypertension 02/18/2015  . Anxiety, generalized 02/18/2015  . Insomnia 02/18/2015  . Arthritis, degenerative 02/18/2015  . Peptic ulcer 02/18/2015  . Elevated prostate specific antigen (PSA) 10/19/2013    Past Surgical History:  Procedure Laterality Date  . ELECTROPHYSIOLOGIC STUDY N/A 10/09/2015   Procedure: Cardioversion;  Surgeon: Isaias Cowman, MD;  Location: [facility] ORS;  Service: Cardiovascular;  Laterality: N/A;  . NASAL RECONSTRUCTION WITH SEPTAL REPAIR    . REPLACEMENT TOTAL  KNEE Right   . SHOULDER SURGERY Left   . TONSILLECTOMY      His family history includes Arthritis in his mother and sister; Colon cancer in his mother; Congestive Heart Failure in his father; Diabetes in his father; Hypertension in his mother; Other in his mother.   Current Outpatient Medications:  .  alprazolam (XANAX) 2 MG tablet, Take 0.5 tablets (1 mg total) by mouth as needed., Disp: 30 tablet, Rfl: 0 .  amiodarone (PACERONE) 200 MG tablet, Take by mouth., Disp: , Rfl:  .  amLODipine (NORVASC) 5 MG tablet, Take by mouth., Disp: , Rfl:  .  apixaban (ELIQUIS) 5 MG TABS tablet, Take 5 mg by mouth 2 (two) times daily., Disp: , Rfl:  .  buPROPion (WELLBUTRIN XL) 300 MG 24 hr tablet, TAKE 1 TABLET BY MOUTH EVERY DAY., Disp: 90 tablet, Rfl: 3 .  Cholecalciferol (VITAMIN D) 2000 UNITS CAPS, Take 1 capsule by mouth daily. , Disp: , Rfl:  .  finasteride (PROSCAR) 5 MG tablet, Take 1 tablet (5 mg total) by mouth daily., Disp: 30 tablet, Rfl: 11 .  folic acid (FOLVITE) 628 MCG tablet, Take 800 mcg by mouth daily. , Disp: , Rfl:  .  furosemide (LASIX) 20 MG tablet, TAKE 1 TABLET (20 MG TOTAL) BY MOUTH ONCE DAILY. TAKE AN ADDITIONAL TABLET ASNEEDED FOR WEIGHT GAIN GREATER THAN 2 POUNDS A NIGHT., Disp: , Rfl:  .  lisinopril (PRINIVIL,ZESTRIL) 5 MG tablet, Take by mouth., Disp: , Rfl:  .  Melatonin 1 MG TABS, Take 2 mg by mouth at bedtime. , Disp: , Rfl:  .  metoprolol succinate (TOPROL-XL) 50 MG 24 hr tablet, TAKE 1 TABLET BY MOUTH DAILY., Disp: 90 tablet, Rfl: 3 .  omeprazole (PRILOSEC) 20 MG capsule, TAKE 1 CAPSULE BY MOUTH EVERY DAY., Disp: 90 capsule, Rfl: 3 .  spironolactone (ALDACTONE) 25 MG tablet, Take by mouth., Disp: , Rfl:  .  tamsulosin (FLOMAX) 0.4 MG CAPS capsule, Take 1 capsule (0.4 mg total) by mouth daily., Disp: 30 capsule, Rfl: 11 .  temazepam (RESTORIL) 30 MG capsule, TAKE 1 CAPSULE BY MOUTH EVERY DAY FOR SLEEP, Disp: 30 capsule, Rfl: 5 .  venlafaxine XR (EFFEXOR-XR) 75 MG 24 hr  capsule, TAKE THREE CAPSULES BY MOUTH EVERY MORNING, Disp: 270 capsule, Rfl: 3 .  donepezil (ARICEPT) 5 MG tablet, Take 5 mg by mouth daily., Disp: , Rfl:  .  flecainide (TAMBOCOR) 50 MG tablet, Take 50 mg by mouth., Disp: , Rfl:  .  metoprolol tartrate (LOPRESSOR) 50 MG tablet, Take 50 mg by mouth 2 (two) times daily., Disp: , Rfl:   Patient Care Team: Jerrol Banana., MD as PCP - General (Family Medicine) Vladimir Crofts, MD as Consulting Physician (Neurology) Isaias Cowman, MD as Consulting Physician (Cardiology) Murrell Redden, MD as Consulting Physician (  Urology) Thornton Park, MD as Referring Physician (Orthopedic Surgery) Ralene Bathe, MD as Consulting Physician (Dermatology) Beverly Gust, MD as Consulting Physician (Otolaryngology)     Objective:    Vitals: BP 122/74 (BP Location: Left Arm, Patient Position: Sitting, Cuff Size: Normal)   Pulse 81   Temp 98.3 F (36.8 C) (Oral)   Ht 6\' 3"  (1.905 m)   Wt 227 lb 3.2 oz (103.1 kg)   SpO2 99%   BMI 28.40 kg/m   Physical Exam  Activities of Daily Living In your present state of health, do you have any difficulty performing the following activities: 05/19/2018  Hearing? Y  Vision? Y  Difficulty concentrating or making decisions? Y  Walking or climbing stairs? N  Dressing or bathing? N  Doing errands, shopping? N  Some recent data might be hidden    Fall Risk Assessment Fall Risk  05/19/2018 05/17/2017 04/28/2016 02/20/2015  Falls in the past year? 1 No Yes No  Number falls in past yr: 1 - 2 or more -  Injury with Fall? 0 - No -     Depression Screen PHQ 2/9 Scores 05/19/2018 05/17/2017 11/26/2016 04/28/2016  PHQ - 2 Score 2 0 0 0  PHQ- 9 Score - - 2 -    6CIT Screen 04/28/2016  What Year? 0 points  What month? 0 points  What time? 0 points  Count back from 20 0 points  Months in reverse 0 points  Repeat phrase 2 points  Total Score 2       Assessment & Plan:   Reviewed patient's  Family Medical History Reviewed and updated list of patient's medical providers Assessment of cognitive impairment was done Assessed patient's functional ability Established a written schedule for health screening [place] Completed and Reviewed  Exercise Activities and Dietary recommendations Goals    . DIET - EAT MORE FRUITS AND VEGETABLES     Recommend increasing amount of fruits and vegetables in daily diet to at least 1 serving of each daily.        Immunization History  Administered Date(s) Administered  . Hepatitis A 11/29/2009  . Tdap 11/29/2009  . Zoster 01/19/2013    Health Maintenance  Topic Date Due  . PNA vac Low Risk Adult (2 of 2 - PPSV23) 04/28/2017  . INFLUENZA VACCINE  11/04/2017  . TETANUS/TDAP  11/30/2019  . COLONOSCOPY  11/22/2023  . Hepatitis C Screening  Completed     Discussed health benefits of physical activity, and encouraged him to engage in regular exercise appropriate for his age and condition.  1. Colon cancer screening  - Ambulatory referral to Gastroenterology  2. AK (actinic keratosis)  - Ambulatory referral to Dermatology  3. Atrial fibrillation, unspecified type [facility]) Status post ablation.  4. Essential (primary) hypertension Controlled.  5. OSA (obstructive sleep apnea)   6. Primary osteoarthritis of both knees   7. Elevated prostate specific antigen (PSA)   8. Recurrent major depressive disorder, in partial remission [facility]) Patient states he is doing fine.  9. Anxiety, generalized Chronic  10. Other insomnia Chronic.    ------------------------------------------------------------------------------------------------------------ I have done the exam and reviewed the chart and it is accurate to the best of my knowledge. Development worker, community has been used and  any errors in dictation or transcription are unintentional. Miguel Aschoff M.D. [place], MD  [facility]

## 2018-06-07 DIAGNOSIS — R2681 Unsteadiness on feet: Secondary | ICD-10-CM | POA: Diagnosis not present

## 2018-06-07 DIAGNOSIS — G4733 Obstructive sleep apnea (adult) (pediatric): Secondary | ICD-10-CM | POA: Diagnosis not present

## 2018-06-07 DIAGNOSIS — R270 Ataxia, unspecified: Secondary | ICD-10-CM | POA: Diagnosis not present

## 2018-06-07 DIAGNOSIS — I4891 Unspecified atrial fibrillation: Secondary | ICD-10-CM | POA: Diagnosis not present

## 2018-06-13 DIAGNOSIS — R269 Unspecified abnormalities of gait and mobility: Secondary | ICD-10-CM | POA: Diagnosis not present

## 2018-06-13 DIAGNOSIS — R42 Dizziness and giddiness: Secondary | ICD-10-CM | POA: Diagnosis not present

## 2018-06-13 DIAGNOSIS — R270 Ataxia, unspecified: Secondary | ICD-10-CM | POA: Diagnosis not present

## 2018-06-16 DIAGNOSIS — I4891 Unspecified atrial fibrillation: Secondary | ICD-10-CM | POA: Diagnosis not present

## 2018-06-16 DIAGNOSIS — R270 Ataxia, unspecified: Secondary | ICD-10-CM | POA: Diagnosis not present

## 2018-06-16 DIAGNOSIS — R2681 Unsteadiness on feet: Secondary | ICD-10-CM | POA: Diagnosis not present

## 2018-06-16 DIAGNOSIS — G319 Degenerative disease of nervous system, unspecified: Secondary | ICD-10-CM | POA: Diagnosis not present

## 2018-06-16 DIAGNOSIS — G3184 Mild cognitive impairment, so stated: Secondary | ICD-10-CM | POA: Diagnosis not present

## 2018-06-24 ENCOUNTER — Other Ambulatory Visit: Payer: Self-pay | Admitting: Family Medicine

## 2018-06-24 DIAGNOSIS — F32A Depression, unspecified: Secondary | ICD-10-CM

## 2018-06-24 DIAGNOSIS — F329 Major depressive disorder, single episode, unspecified: Secondary | ICD-10-CM

## 2018-07-07 DIAGNOSIS — R0602 Shortness of breath: Secondary | ICD-10-CM | POA: Diagnosis not present

## 2018-07-07 DIAGNOSIS — Z9889 Other specified postprocedural states: Secondary | ICD-10-CM | POA: Diagnosis not present

## 2018-07-07 DIAGNOSIS — I4891 Unspecified atrial fibrillation: Secondary | ICD-10-CM | POA: Diagnosis not present

## 2018-07-07 DIAGNOSIS — R002 Palpitations: Secondary | ICD-10-CM | POA: Diagnosis not present

## 2018-07-07 DIAGNOSIS — I4892 Unspecified atrial flutter: Secondary | ICD-10-CM | POA: Diagnosis not present

## 2018-07-07 DIAGNOSIS — G4733 Obstructive sleep apnea (adult) (pediatric): Secondary | ICD-10-CM | POA: Diagnosis not present

## 2018-07-07 DIAGNOSIS — I420 Dilated cardiomyopathy: Secondary | ICD-10-CM | POA: Diagnosis not present

## 2018-07-07 DIAGNOSIS — I10 Essential (primary) hypertension: Secondary | ICD-10-CM | POA: Diagnosis not present

## 2018-07-07 DIAGNOSIS — Z8679 Personal history of other diseases of the circulatory system: Secondary | ICD-10-CM | POA: Diagnosis not present

## 2018-07-07 DIAGNOSIS — I5023 Acute on chronic systolic (congestive) heart failure: Secondary | ICD-10-CM | POA: Diagnosis not present

## 2018-08-15 DIAGNOSIS — R109 Unspecified abdominal pain: Secondary | ICD-10-CM | POA: Diagnosis not present

## 2018-08-15 DIAGNOSIS — S2231XA Fracture of one rib, right side, initial encounter for closed fracture: Secondary | ICD-10-CM | POA: Diagnosis not present

## 2018-08-15 DIAGNOSIS — R55 Syncope and collapse: Secondary | ICD-10-CM | POA: Diagnosis not present

## 2018-08-15 DIAGNOSIS — S2242XA Multiple fractures of ribs, left side, initial encounter for closed fracture: Secondary | ICD-10-CM | POA: Diagnosis not present

## 2018-08-15 DIAGNOSIS — W010XXA Fall on same level from slipping, tripping and stumbling without subsequent striking against object, initial encounter: Secondary | ICD-10-CM | POA: Diagnosis not present

## 2018-08-15 DIAGNOSIS — R1031 Right lower quadrant pain: Secondary | ICD-10-CM | POA: Diagnosis not present

## 2018-08-18 ENCOUNTER — Encounter: Payer: Self-pay | Admitting: Family Medicine

## 2018-08-18 ENCOUNTER — Ambulatory Visit (INDEPENDENT_AMBULATORY_CARE_PROVIDER_SITE_OTHER): Payer: Medicare Other | Admitting: Family Medicine

## 2018-08-18 ENCOUNTER — Other Ambulatory Visit: Payer: Self-pay

## 2018-08-18 VITALS — BP 120/64 | HR 68 | Temp 97.9°F | Resp 16 | Ht 75.0 in | Wt 224.0 lb

## 2018-08-18 DIAGNOSIS — G4733 Obstructive sleep apnea (adult) (pediatric): Secondary | ICD-10-CM | POA: Diagnosis not present

## 2018-08-18 DIAGNOSIS — M170 Bilateral primary osteoarthritis of knee: Secondary | ICD-10-CM | POA: Diagnosis not present

## 2018-08-18 DIAGNOSIS — G319 Degenerative disease of nervous system, unspecified: Secondary | ICD-10-CM

## 2018-08-18 DIAGNOSIS — I10 Essential (primary) hypertension: Secondary | ICD-10-CM | POA: Diagnosis not present

## 2018-08-18 DIAGNOSIS — I482 Chronic atrial fibrillation, unspecified: Secondary | ICD-10-CM | POA: Diagnosis not present

## 2018-08-18 NOTE — Progress Notes (Signed)
Patient: Jeffrey French Male    DOB: 1950-11-29   68 y.o.   MRN: [id number] Visit Date: 08/18/2018  Today's Provider: [name], MD   Chief Complaint  Patient presents with  . Hypertension  . Gastroesophageal Reflux  . Depression  . Insomnia   Subjective:     HPI  Hypertension, follow-up:  BP Readings from Last 3 Encounters:  08/18/18 120/64  05/19/18 122/74  01/05/18 124/81    He was last seen for hypertension 3 months ago.  BP at that visit was 122/74. Management since that visit includes no changes. He reports good compliance with treatment. He is not having side effects.  He is not exercising. He is adherent to low salt diet.   Outside blood pressures are checked occasionally . He is experiencing none.  Patient denies exertional chest pressure/discomfort, lower extremity edema and palpitations.   Cardiovascular risk factors include dyslipidemia.   Weight trend: stable Wt Readings from Last 3 Encounters:  08/18/18 224 lb (101.6 kg)  05/19/18 227 lb 3.2 oz (103.1 kg)  01/05/18 230 lb (104.3 kg)    Current diet: well balanced  Patient feels well today with no complaints. He reports that he has been tolerating his medications well. He reports that he does occasionally have sleep issues, but temazepam seems to help.   Allergies  Allergen Reactions  . Sulfamethoxazole-Trimethoprim      Current Outpatient Medications:  .  alprazolam (XANAX) 2 MG tablet, Take 0.5 tablets (1 mg total) by mouth as needed., Disp: 30 tablet, Rfl: 0 .  amiodarone (PACERONE) 200 MG tablet, Take by mouth., Disp: , Rfl:  .  amLODipine (NORVASC) 5 MG tablet, Take by mouth., Disp: , Rfl:  .  apixaban (ELIQUIS) 5 MG TABS tablet, Take 5 mg by mouth 2 (two) times daily., Disp: , Rfl:  .  buPROPion (WELLBUTRIN XL) 300 MG 24 hr tablet, TAKE 1 TABLET BY MOUTH EVERY DAY., Disp: 90 tablet, Rfl: 3 .  Cholecalciferol (VITAMIN D) 2000 UNITS CAPS, Take 1 capsule by mouth daily. ,  Disp: , Rfl:  .  donepezil (ARICEPT) 5 MG tablet, Take 5 mg by mouth daily., Disp: , Rfl:  .  finasteride (PROSCAR) 5 MG tablet, Take 1 tablet (5 mg total) by mouth daily., Disp: 30 tablet, Rfl: 11 .  flecainide (TAMBOCOR) 50 MG tablet, Take 50 mg by mouth., Disp: , Rfl:  .  folic acid (FOLVITE) 893 MCG tablet, Take 800 mcg by mouth daily. , Disp: , Rfl:  .  furosemide (LASIX) 20 MG tablet, TAKE 1 TABLET (20 MG TOTAL) BY MOUTH ONCE DAILY. TAKE AN ADDITIONAL TABLET ASNEEDED FOR WEIGHT GAIN GREATER THAN 2 POUNDS A NIGHT., Disp: , Rfl:  .  lisinopril (PRINIVIL,ZESTRIL) 5 MG tablet, Take by mouth., Disp: , Rfl:  .  Melatonin 1 MG TABS, Take 2 mg by mouth at bedtime. , Disp: , Rfl:  .  metoprolol succinate (TOPROL-XL) 50 MG 24 hr tablet, TAKE 1 TABLET BY MOUTH DAILY., Disp: 90 tablet, Rfl: 3 .  metoprolol tartrate (LOPRESSOR) 50 MG tablet, Take 50 mg by mouth 2 (two) times daily., Disp: , Rfl:  .  omeprazole (PRILOSEC) 20 MG capsule, TAKE 1 CAPSULE BY MOUTH EVERY DAY., Disp: 90 capsule, Rfl: 3 .  spironolactone (ALDACTONE) 25 MG tablet, Take by mouth., Disp: , Rfl:  .  tamsulosin (FLOMAX) 0.4 MG CAPS capsule, Take 1 capsule (0.4 mg total) by mouth daily., Disp: 30 capsule, Rfl: 11 .  temazepam (RESTORIL) 30 MG capsule, TAKE 1 CAPSULE BY MOUTH EVERY DAY FOR SLEEP, Disp: 30 capsule, Rfl: 5 .  venlafaxine XR (EFFEXOR-XR) 75 MG 24 hr capsule, TAKE THREE CAPSULES BY MOUTH EVERY MORNING, Disp: 270 capsule, Rfl: 3  Review of Systems  Constitutional: Negative for activity change, appetite change, diaphoresis, fatigue, fever and unexpected weight change.  Respiratory: Negative for cough and shortness of breath.   Cardiovascular: Negative for chest pain, palpitations and leg swelling.  Gastrointestinal: Negative for abdominal distention, abdominal pain, blood in stool, constipation, diarrhea, nausea, rectal pain and vomiting.  Endocrine: Negative for cold intolerance, heat intolerance, polydipsia and  polyphagia.  Neurological: Positive for light-headedness. Negative for dizziness, tremors, syncope, weakness and headaches.  Psychiatric/Behavioral: Negative for agitation, confusion, decreased concentration, self-injury, sleep disturbance and suicidal ideas. The patient is not nervous/anxious and is not hyperactive.     Social History   Tobacco Use  . Smoking status: Former Smoker    Types: Cigarettes  . Smokeless tobacco: Never Used  . Tobacco comment: 1974  Substance Use Topics  . Alcohol use: Yes    Alcohol/week: 0.0 - 2.0 standard drinks      Objective:   BP 120/64 (BP Location: Left Arm, Patient Position: Sitting, Cuff Size: Large)   Pulse 68   Temp 97.9 F (36.6 C)   Resp 16   Ht 6\' 3"  (1.905 m)   Wt 224 lb (101.6 kg)   SpO2 100%   BMI 28.00 kg/m  Vitals:   08/18/18 1130  BP: 120/64  Pulse: 68  Resp: 16  Temp: 97.9 F (36.6 C)  SpO2: 100%  Weight: 224 lb (101.6 kg)  Height: 6\' 3"  (1.905 m)     Physical Exam Vitals signs reviewed.  Constitutional:      Appearance: He is well-developed.  HENT:     Head: Normocephalic and atraumatic.     Right Ear: External ear normal.     Left Ear: External ear normal.  Eyes:     General: No scleral icterus.    Conjunctiva/sclera: Conjunctivae normal.  Neck:     Thyroid: No thyromegaly.  Cardiovascular:     Rate and Rhythm: Normal rate and regular rhythm.     Heart sounds: Normal heart sounds.  Pulmonary:     Effort: Pulmonary effort is normal.     Breath sounds: Normal breath sounds.  Abdominal:     Palpations: Abdomen is soft.  Skin:    General: Skin is warm and dry.  Neurological:     Mental Status: He is alert and oriented to person, place, and time. Mental status is at baseline.  Psychiatric:        Mood and Affect: Mood normal.        Behavior: Behavior normal.        Thought Content: Thought content normal.        Judgment: Judgment normal.         Assessment & Plan    1. Essential (primary)  hypertension On amlodipine,lisinopril.   2. Chronic atrial fibrillation On Amiodorone,Eliquis.  3. OSA (obstructive sleep apnea) On CPAP.  4. Diffuse cerebral atrophy ([place])   5. Primary osteoarthritis of both knees      [name], MD  [facility]

## 2018-09-25 DIAGNOSIS — R000 Tachycardia, unspecified: Secondary | ICD-10-CM | POA: Diagnosis not present

## 2018-09-25 DIAGNOSIS — R509 Fever, unspecified: Secondary | ICD-10-CM | POA: Diagnosis not present

## 2018-09-25 DIAGNOSIS — N390 Urinary tract infection, site not specified: Secondary | ICD-10-CM | POA: Diagnosis not present

## 2018-09-25 DIAGNOSIS — Z20828 Contact with and (suspected) exposure to other viral communicable diseases: Secondary | ICD-10-CM | POA: Diagnosis not present

## 2018-09-25 DIAGNOSIS — R300 Dysuria: Secondary | ICD-10-CM | POA: Diagnosis not present

## 2018-09-27 ENCOUNTER — Other Ambulatory Visit: Payer: Self-pay | Admitting: Family Medicine

## 2018-09-27 DIAGNOSIS — F329 Major depressive disorder, single episode, unspecified: Secondary | ICD-10-CM

## 2018-09-27 DIAGNOSIS — F32A Depression, unspecified: Secondary | ICD-10-CM

## 2018-10-24 DIAGNOSIS — H2511 Age-related nuclear cataract, right eye: Secondary | ICD-10-CM | POA: Diagnosis not present

## 2018-11-01 DIAGNOSIS — H2512 Age-related nuclear cataract, left eye: Secondary | ICD-10-CM | POA: Diagnosis not present

## 2018-11-01 DIAGNOSIS — I10 Essential (primary) hypertension: Secondary | ICD-10-CM | POA: Diagnosis not present

## 2018-11-02 ENCOUNTER — Encounter: Payer: Self-pay | Admitting: *Deleted

## 2018-11-02 ENCOUNTER — Other Ambulatory Visit: Payer: Self-pay

## 2018-11-02 NOTE — Anesthesia Preprocedure Evaluation (Addendum)
Anesthesia Evaluation  Patient identified by MRN, date of birth, ID band Patient awake    Reviewed: Allergy & Precautions, NPO status , Patient's Chart, lab work & pertinent test results  History of Anesthesia Complications Negative for: history of anesthetic complications  Airway Mallampati: II   Neck ROM: Full    Dental  (+)    Pulmonary sleep apnea , former smoker (quit 1974),    Pulmonary exam normal breath sounds clear to auscultation       Cardiovascular hypertension, +CHF  Normal cardiovascular exam+ dysrhythmias (s/p cardioversion, on Eliquis, last dose 11/08/18) Atrial Fibrillation  Rhythm:Regular Rate:Normal  Dilated cardiomyopathy   Neuro/Psych negative neurological ROS     GI/Hepatic GERD  Medicated and Controlled,  Endo/Other  negative endocrine ROS  Renal/GU negative Renal ROS     Musculoskeletal   Abdominal   Peds  Hematology negative hematology ROS (+)   Anesthesia Other Findings Cardiology note 07/07/18:  Assessment   68 y.o. male with  1. Essential (primary) hypertension  2. Atrial flutter, unspecified type (CMS-HCC)  3. Atrial fibrillation status post cardioversion (CMS-HCC)  4. Cardiomyopathy, dilated (CMS-HCC)  5. Acute on chronic systolic CHF (congestive heart failure) (CMS-HCC)  6. OSA (obstructive sleep apnea)  7. SOB (shortness of breath) on exertion  8. Heart palpitations  9. Status post ablation of atrial fibrillation   68 year old gentleman, diagnosed with atrial flutter, on Eliquis for stroke prevention, who underwent successful cardioversion on 10/09/2015, with short-lived recurrence. Initial 2D echocardiogram revealed dilated cardiomyopathy, while the patient was in atrial flutter, had been asymptomatic without evidence for congestive heart failure. Repeat 2D echocardiogram while the patient was in sinus rhythm revealed LV ejection fraction of 45%. The patient was recently evaluated at  [facility] on 12/06/2017 for shortness of breath and chest pain, treated for pneumonia and pleural effusions, and was successfully cardioverted in the ER. Patient went back in atrial fibrillation, underwent successful catheter ablation 02/16/2018, only to redevelop atrial fibrillation on 02/19/2018 refractory to uptitrating flecainide 200 mg twice daily. The patient underwent successful cardioversion, flecainide transitioned to amiodarone. The patient appears to have remained in sinus rhythm, currently on amiodarone 100 mg daily.  Plan   1. Continue current medication 2. Continue Eliquis for stroke prevention 3. Counseled patient about low-sodium diet 4. DASH diet printed instructions given to the patient 5. Continue low-dose amiodarone 6. Return to clinic for follow-up in 4 months at which time we will DC amiodarone  No orders of the defined types were placed in this encounter.  Return in about 4 months (around 11/06/2018).   Isaias Cowman, MD PhD [facility]    Reproductive/Obstetrics                            Anesthesia Physical Anesthesia Plan  ASA: III  Anesthesia Plan: MAC   Post-op Pain Management:    Induction: Intravenous  PONV Risk Score and Plan: 1 and TIVA and Midazolam  Airway Management Planned: Natural Airway  Additional Equipment:   Intra-op Plan:   Post-operative Plan:   Informed Consent: I have reviewed the patients History and Physical, chart, labs and discussed the procedure including the risks, benefits and alternatives for the proposed anesthesia with the patient or authorized representative who has indicated his/her understanding and acceptance.       Plan Discussed with: CRNA  Anesthesia Plan Comments:        Anesthesia Quick Evaluation

## 2018-11-03 NOTE — Discharge Instructions (Signed)

## 2018-11-04 ENCOUNTER — Encounter
Admission: RE | Admit: 2018-11-04 | Discharge: 2018-11-04 | Disposition: A | Discharge status: Home / Self Care | Payer: Medicare Other | Source: Ambulatory Visit | Attending: Ophthalmology | Admitting: Ophthalmology

## 2018-11-04 ENCOUNTER — Other Ambulatory Visit: Payer: Self-pay

## 2018-11-04 DIAGNOSIS — Z20828 Contact with and (suspected) exposure to other viral communicable diseases: Secondary | ICD-10-CM | POA: Insufficient documentation

## 2018-11-05 LAB — SARS CORONAVIRUS 2 (TAT 6-24 HRS): SARS Coronavirus 2: NEGATIVE

## 2018-11-07 DIAGNOSIS — I5023 Acute on chronic systolic (congestive) heart failure: Secondary | ICD-10-CM | POA: Diagnosis not present

## 2018-11-07 DIAGNOSIS — I4819 Other persistent atrial fibrillation: Secondary | ICD-10-CM | POA: Diagnosis not present

## 2018-11-07 DIAGNOSIS — I4891 Unspecified atrial fibrillation: Secondary | ICD-10-CM | POA: Diagnosis not present

## 2018-11-07 DIAGNOSIS — I10 Essential (primary) hypertension: Secondary | ICD-10-CM | POA: Diagnosis not present

## 2018-11-07 DIAGNOSIS — R0602 Shortness of breath: Secondary | ICD-10-CM | POA: Diagnosis not present

## 2018-11-07 DIAGNOSIS — R002 Palpitations: Secondary | ICD-10-CM | POA: Diagnosis not present

## 2018-11-07 DIAGNOSIS — I420 Dilated cardiomyopathy: Secondary | ICD-10-CM | POA: Diagnosis not present

## 2018-11-07 DIAGNOSIS — G4733 Obstructive sleep apnea (adult) (pediatric): Secondary | ICD-10-CM | POA: Diagnosis not present

## 2018-11-08 ENCOUNTER — Ambulatory Visit: Payer: Medicare Other | Admitting: Anesthesiology

## 2018-11-08 ENCOUNTER — Ambulatory Visit
Admission: RE | Admit: 2018-11-08 | Discharge: 2018-11-08 | Disposition: A | Discharge status: Home / Self Care | Payer: Medicare Other | Attending: Ophthalmology | Admitting: Ophthalmology

## 2018-11-08 ENCOUNTER — Encounter
Admission: RE | Disposition: A | Discharge status: Home / Self Care | Payer: Self-pay | Source: Home / Self Care | Attending: Ophthalmology

## 2018-11-08 ENCOUNTER — Other Ambulatory Visit: Payer: Self-pay

## 2018-11-08 DIAGNOSIS — G4730 Sleep apnea, unspecified: Secondary | ICD-10-CM | POA: Diagnosis not present

## 2018-11-08 DIAGNOSIS — H9190 Unspecified hearing loss, unspecified ear: Secondary | ICD-10-CM | POA: Insufficient documentation

## 2018-11-08 DIAGNOSIS — Z79899 Other long term (current) drug therapy: Secondary | ICD-10-CM | POA: Insufficient documentation

## 2018-11-08 DIAGNOSIS — I4891 Unspecified atrial fibrillation: Secondary | ICD-10-CM | POA: Insufficient documentation

## 2018-11-08 DIAGNOSIS — H25812 Combined forms of age-related cataract, left eye: Secondary | ICD-10-CM | POA: Diagnosis not present

## 2018-11-08 DIAGNOSIS — F329 Major depressive disorder, single episode, unspecified: Secondary | ICD-10-CM | POA: Diagnosis not present

## 2018-11-08 DIAGNOSIS — Z7901 Long term (current) use of anticoagulants: Secondary | ICD-10-CM | POA: Diagnosis not present

## 2018-11-08 DIAGNOSIS — I10 Essential (primary) hypertension: Secondary | ICD-10-CM | POA: Insufficient documentation

## 2018-11-08 DIAGNOSIS — Z96659 Presence of unspecified artificial knee joint: Secondary | ICD-10-CM | POA: Diagnosis not present

## 2018-11-08 DIAGNOSIS — H2512 Age-related nuclear cataract, left eye: Secondary | ICD-10-CM | POA: Insufficient documentation

## 2018-11-08 HISTORY — PX: CATARACT EXTRACTION W/PHACO: SHX586

## 2018-11-08 HISTORY — DX: Sleep apnea, unspecified: G47.30

## 2018-11-08 HISTORY — DX: Anesthesia of skin: R20.0

## 2018-11-08 HISTORY — DX: Unspecified atrial fibrillation: I48.91

## 2018-11-08 SURGERY — PHACOEMULSIFICATION, CATARACT, WITH IOL INSERTION
Anesthesia: Monitor Anesthesia Care | Site: Eye | Laterality: Left

## 2018-11-08 MED ORDER — NA CHONDROIT SULF-NA HYALURON 40-17 MG/ML IO SOLN
INTRAOCULAR | Status: DC | PRN
  Administered 2018-11-08: 1 mL via INTRAOCULAR

## 2018-11-08 MED ORDER — BRIMONIDINE TARTRATE-TIMOLOL 0.2-0.5 % OP SOLN
OPHTHALMIC | Status: DC | PRN
  Administered 2018-11-08: 1 [drp] via OPHTHALMIC

## 2018-11-08 MED ORDER — ACETAMINOPHEN 160 MG/5ML PO SOLN: 325.0000 mg | ORAL | Status: DC | PRN

## 2018-11-08 MED ORDER — ONDANSETRON HCL 4 MG/2ML IJ SOLN: 4.0000 mg | Freq: Once | INTRAMUSCULAR | Status: DC | PRN

## 2018-11-08 MED ORDER — ARMC OPHTHALMIC DILATING DROPS
1.0000 "application " | OPHTHALMIC | Status: DC | PRN
  Administered 2018-11-08 (×3): 1 via OPHTHALMIC

## 2018-11-08 MED ORDER — LIDOCAINE HCL (PF) 2 % IJ SOLN
INTRAOCULAR | Status: DC | PRN
  Administered 2018-11-08: 1 mL

## 2018-11-08 MED ORDER — TETRACAINE HCL 0.5 % OP SOLN
1.0000 [drp] | OPHTHALMIC | Status: DC | PRN
  Administered 2018-11-08 (×3): 1 [drp] via OPHTHALMIC

## 2018-11-08 MED ORDER — MOXIFLOXACIN HCL 0.5 % OP SOLN
OPHTHALMIC | Status: DC | PRN
  Administered 2018-11-08: 0.2 mL via OPHTHALMIC

## 2018-11-08 MED ORDER — FENTANYL CITRATE (PF) 100 MCG/2ML IJ SOLN
INTRAMUSCULAR | Status: DC | PRN
  Administered 2018-11-08 (×2): 50 ug via INTRAVENOUS

## 2018-11-08 MED ORDER — MIDAZOLAM HCL 2 MG/2ML IJ SOLN
INTRAMUSCULAR | Status: DC | PRN
  Administered 2018-11-08: 2 mg via INTRAVENOUS

## 2018-11-08 MED ORDER — EPINEPHRINE PF 1 MG/ML IJ SOLN
INTRAOCULAR | Status: DC | PRN
  Administered 2018-11-08: 58 mL via OPHTHALMIC

## 2018-11-08 MED ORDER — ACETAMINOPHEN 325 MG PO TABS: 650.0000 mg | ORAL_TABLET | Freq: Once | ORAL | Status: DC | PRN

## 2018-11-08 SURGICAL SUPPLY — 17 items
CANNULA ANT/CHMB 27GA (MISCELLANEOUS) ×2 IMPLANT
GLOVE SURG LX 8.0 MICRO (GLOVE) ×1
GLOVE SURG LX STRL 8.0 MICRO (GLOVE) ×1 IMPLANT
GLOVE SURG TRIUMPH 8.0 PF LTX (GLOVE) ×2 IMPLANT
GOWN STRL REUS W/ TWL LRG LVL3 (GOWN DISPOSABLE) ×2 IMPLANT
GOWN STRL REUS W/TWL LRG LVL3 (GOWN DISPOSABLE) ×2
LENS IOL TECNIS ITEC 16.5 (Intraocular Lens) ×2 IMPLANT
MARKER SKIN DUAL TIP RULER LAB (MISCELLANEOUS) ×2 IMPLANT
NDL RETROBULBAR .5 NSTRL (NEEDLE) ×2 IMPLANT
NEEDLE FILTER BLUNT 18X 1/2SAF (NEEDLE) ×1
NEEDLE FILTER BLUNT 18X1 1/2 (NEEDLE) ×1 IMPLANT
PACK EYE AFTER SURG (MISCELLANEOUS) ×2 IMPLANT
PACK OPTHALMIC (MISCELLANEOUS) ×2 IMPLANT
PACK PORFILIO (MISCELLANEOUS) ×2 IMPLANT
SYR 3ML LL SCALE MARK (SYRINGE) ×2 IMPLANT
SYR TB 1ML LUER SLIP (SYRINGE) ×2 IMPLANT
WIPE NON LINTING 3.25X3.25 (MISCELLANEOUS) ×2 IMPLANT

## 2018-11-08 NOTE — Anesthesia Postprocedure Evaluation (Signed)
Anesthesia Post Note  Patient: Jeffrey French  Procedure(s) Performed: CATARACT EXTRACTION PHACO AND INTRAOCULAR LENS PLACEMENT (IOC) LEFT (Left Eye)  Patient location during evaluation: PACU Anesthesia Type: MAC Level of consciousness: awake and alert, oriented and patient cooperative Pain management: pain level controlled Vital Signs Assessment: post-procedure vital signs reviewed and stable Respiratory status: spontaneous breathing, nonlabored ventilation and respiratory function stable Cardiovascular status: blood pressure returned to baseline and stable Postop Assessment: adequate PO intake Anesthetic complications: no    Darrin Nipper

## 2018-11-08 NOTE — H&P (Signed)
All labs reviewed. Abnormal studies sent to patients PCP when indicated.  Previous H&P reviewed, patient examined, there are NO CHANGES.  Jeffrey French Porfilio8/4/20209:55 AM

## 2018-11-08 NOTE — Op Note (Signed)
PREOPERATIVE DIAGNOSIS:  Nuclear sclerotic cataract of the left eye.   POSTOPERATIVE DIAGNOSIS:  Nuclear sclerotic cataract of the left eye.   OPERATIVE PROCEDURE: Procedure(s): CATARACT EXTRACTION PHACO AND INTRAOCULAR LENS PLACEMENT (IOC) LEFT   SURGEON:  Birder Robson, MD.   ANESTHESIA:  Anesthesiologist: Darrin Nipper, MD CRNA: Jeannene Patella, CRNA  1.      Managed anesthesia care. 2.     0.21ml of Shugarcaine was instilled following the paracentesis   COMPLICATIONS:  None.   TECHNIQUE:   Stop and chop   DESCRIPTION OF PROCEDURE:  The patient was examined and consented in the preoperative holding area where the aforementioned topical anesthesia was applied to the left eye and then brought back to the Operating Room where the left eye was prepped and draped in the usual sterile ophthalmic fashion and a lid speculum was placed. A paracentesis was created with the side port blade and the anterior chamber was filled with viscoelastic. A near clear corneal incision was performed with the steel keratome. A continuous curvilinear capsulorrhexis was performed with a cystotome followed by the capsulorrhexis forceps. Hydrodissection and hydrodelineation were carried out with BSS on a blunt cannula. The lens was removed in a stop and chop  technique and the remaining cortical material was removed with the irrigation-aspiration handpiece. The capsular bag was inflated with viscoelastic and the Technis [id number] lens was placed in the capsular bag without complication. The remaining viscoelastic was removed from the eye with the irrigation-aspiration handpiece. The wounds were hydrated. The anterior chamber was flushed with BSS and the eye was inflated to physiologic pressure. 0.45ml Vigamox was placed in the anterior chamber. The wounds were found to be water tight. The eye was dressed with Combigan. The patient was given protective glasses to wear throughout the day and a shield with which to sleep  tonight. The patient was also given drops with which to begin a drop regimen today and will follow-up with me in one day. Implant Name Type Inv. Item Serial No. Manufacturer Lot No. LRB No. Used Action  LENS IOL DIOP 16.5 - [id number] Intraocular Lens LENS IOL DIOP 16.5 [id number]  Left 1 Implanted    Procedure(s) with comments: CATARACT EXTRACTION PHACO AND INTRAOCULAR LENS PLACEMENT (IOC) LEFT (Left) - sleep apnea not a Toric per [employer]  Electronically signed: Birder Robson 11/08/2018 10:19 AM

## 2018-11-08 NOTE — Transfer of Care (Signed)
Immediate Anesthesia Transfer of Care Note  Patient: Jeffrey French  Procedure(s) Performed: CATARACT EXTRACTION PHACO AND INTRAOCULAR LENS PLACEMENT (IOC) LEFT (Left Eye)  Patient Location: PACU  Anesthesia Type: MAC  Level of Consciousness: awake, alert  and patient cooperative  Airway and Oxygen Therapy: Patient Spontanous Breathing and Patient connected to supplemental oxygen  Post-op Assessment: Post-op Vital signs reviewed, Patient's Cardiovascular Status Stable, Respiratory Function Stable, Patent Airway and No signs of Nausea or vomiting  Post-op Vital Signs: Reviewed and stable  Complications: No apparent anesthesia complications

## 2018-11-09 ENCOUNTER — Encounter: Payer: Self-pay | Admitting: Ophthalmology

## 2018-11-16 DIAGNOSIS — I4891 Unspecified atrial fibrillation: Secondary | ICD-10-CM | POA: Diagnosis not present

## 2018-11-16 DIAGNOSIS — H2511 Age-related nuclear cataract, right eye: Secondary | ICD-10-CM | POA: Diagnosis not present

## 2018-11-23 ENCOUNTER — Encounter: Payer: Self-pay | Admitting: *Deleted

## 2018-11-23 ENCOUNTER — Other Ambulatory Visit: Payer: Self-pay

## 2018-11-25 ENCOUNTER — Other Ambulatory Visit
Admission: RE | Admit: 2018-11-25 | Discharge: 2018-11-25 | Disposition: A | Discharge status: Home / Self Care | Payer: Medicare Other | Source: Ambulatory Visit | Attending: Ophthalmology | Admitting: Ophthalmology

## 2018-11-25 ENCOUNTER — Other Ambulatory Visit: Payer: Self-pay

## 2018-11-25 DIAGNOSIS — Z20828 Contact with and (suspected) exposure to other viral communicable diseases: Secondary | ICD-10-CM | POA: Insufficient documentation

## 2018-11-25 DIAGNOSIS — Z01812 Encounter for preprocedural laboratory examination: Secondary | ICD-10-CM | POA: Diagnosis not present

## 2018-11-25 LAB — SARS CORONAVIRUS 2 (TAT 6-24 HRS): SARS Coronavirus 2: NEGATIVE

## 2018-11-25 NOTE — Discharge Instructions (Signed)

## 2018-11-29 ENCOUNTER — Encounter
Admission: RE | Disposition: A | Discharge status: Home / Self Care | Payer: Self-pay | Source: Home / Self Care | Attending: Ophthalmology

## 2018-11-29 ENCOUNTER — Ambulatory Visit: Admit: 2018-11-29 | Payer: Medicare Other | Admitting: Ophthalmology

## 2018-11-29 ENCOUNTER — Ambulatory Visit
Admission: RE | Admit: 2018-11-29 | Discharge: 2018-11-29 | Disposition: A | Discharge status: Home / Self Care | Payer: Medicare Other | Attending: Ophthalmology | Admitting: Ophthalmology

## 2018-11-29 ENCOUNTER — Ambulatory Visit: Payer: Medicare Other | Admitting: Anesthesiology

## 2018-11-29 ENCOUNTER — Other Ambulatory Visit: Payer: Self-pay

## 2018-11-29 DIAGNOSIS — H2511 Age-related nuclear cataract, right eye: Secondary | ICD-10-CM | POA: Diagnosis not present

## 2018-11-29 DIAGNOSIS — I4891 Unspecified atrial fibrillation: Secondary | ICD-10-CM | POA: Diagnosis not present

## 2018-11-29 DIAGNOSIS — I5023 Acute on chronic systolic (congestive) heart failure: Secondary | ICD-10-CM | POA: Diagnosis not present

## 2018-11-29 DIAGNOSIS — I110 Hypertensive heart disease with heart failure: Secondary | ICD-10-CM | POA: Insufficient documentation

## 2018-11-29 DIAGNOSIS — I429 Cardiomyopathy, unspecified: Secondary | ICD-10-CM | POA: Insufficient documentation

## 2018-11-29 DIAGNOSIS — Z79899 Other long term (current) drug therapy: Secondary | ICD-10-CM | POA: Insufficient documentation

## 2018-11-29 DIAGNOSIS — Z7901 Long term (current) use of anticoagulants: Secondary | ICD-10-CM | POA: Diagnosis not present

## 2018-11-29 DIAGNOSIS — G4733 Obstructive sleep apnea (adult) (pediatric): Secondary | ICD-10-CM | POA: Diagnosis not present

## 2018-11-29 DIAGNOSIS — H25811 Combined forms of age-related cataract, right eye: Secondary | ICD-10-CM | POA: Diagnosis not present

## 2018-11-29 HISTORY — PX: CATARACT EXTRACTION W/PHACO: SHX586

## 2018-11-29 SURGERY — PHACOEMULSIFICATION, CATARACT, WITH IOL INSERTION
Anesthesia: Monitor Anesthesia Care | Site: Eye | Laterality: Right

## 2018-11-29 SURGERY — PHACOEMULSIFICATION, CATARACT, WITH IOL INSERTION
Anesthesia: Choice | Laterality: Right

## 2018-11-29 MED ORDER — EPINEPHRINE PF 1 MG/ML IJ SOLN
INTRAOCULAR | Status: DC | PRN
  Administered 2018-11-29: 69 mL via OPHTHALMIC

## 2018-11-29 MED ORDER — LIDOCAINE HCL (PF) 2 % IJ SOLN
INTRAOCULAR | Status: DC | PRN
  Administered 2018-11-29: 1 mL

## 2018-11-29 MED ORDER — FENTANYL CITRATE (PF) 100 MCG/2ML IJ SOLN
INTRAMUSCULAR | Status: DC | PRN
  Administered 2018-11-29 (×2): 50 ug via INTRAVENOUS

## 2018-11-29 MED ORDER — MIDAZOLAM HCL 2 MG/2ML IJ SOLN
INTRAMUSCULAR | Status: DC | PRN
  Administered 2018-11-29: 2 mg via INTRAVENOUS

## 2018-11-29 MED ORDER — ARMC OPHTHALMIC DILATING DROPS
1.0000 "application " | OPHTHALMIC | Status: DC | PRN
  Administered 2018-11-29 (×3): 1 via OPHTHALMIC

## 2018-11-29 MED ORDER — TETRACAINE HCL 0.5 % OP SOLN
1.0000 [drp] | OPHTHALMIC | Status: DC | PRN
  Administered 2018-11-29 (×3): 1 [drp] via OPHTHALMIC

## 2018-11-29 MED ORDER — MOXIFLOXACIN HCL 0.5 % OP SOLN
OPHTHALMIC | Status: DC | PRN
  Administered 2018-11-29: 0.2 mL via OPHTHALMIC

## 2018-11-29 MED ORDER — LACTATED RINGERS IV SOLN: INTRAVENOUS | Status: DC

## 2018-11-29 MED ORDER — BRIMONIDINE TARTRATE-TIMOLOL 0.2-0.5 % OP SOLN
OPHTHALMIC | Status: DC | PRN
  Administered 2018-11-29: 1 [drp] via OPHTHALMIC

## 2018-11-29 MED ORDER — ACETAMINOPHEN 325 MG PO TABS: 325.0000 mg | ORAL_TABLET | Freq: Once | ORAL | Status: DC

## 2018-11-29 MED ORDER — NA CHONDROIT SULF-NA HYALURON 40-17 MG/ML IO SOLN
INTRAOCULAR | Status: DC | PRN
  Administered 2018-11-29: 1 mL via INTRAOCULAR

## 2018-11-29 MED ORDER — ACETAMINOPHEN 160 MG/5ML PO SOLN: 325.0000 mg | Freq: Once | ORAL | Status: DC

## 2018-11-29 SURGICAL SUPPLY — 18 items
CANNULA ANT/CHMB 27GA (MISCELLANEOUS) ×4 IMPLANT
GLOVE SURG LX 8.0 MICRO (GLOVE) ×1
GLOVE SURG LX STRL 8.0 MICRO (GLOVE) ×1 IMPLANT
GLOVE SURG TRIUMPH 8.0 PF LTX (GLOVE) ×2 IMPLANT
GOWN STRL REUS W/ TWL LRG LVL3 (GOWN DISPOSABLE) ×2 IMPLANT
GOWN STRL REUS W/TWL LRG LVL3 (GOWN DISPOSABLE) ×2
LENS IOL TECNIS ITEC 16.0 (Intraocular Lens) ×2 IMPLANT
MARKER SKIN DUAL TIP RULER LAB (MISCELLANEOUS) ×2 IMPLANT
NDL RETROBULBAR .5 NSTRL (NEEDLE) ×2 IMPLANT
NEEDLE FILTER BLUNT 18X 1/2SAF (NEEDLE) ×1
NEEDLE FILTER BLUNT 18X1 1/2 (NEEDLE) ×1 IMPLANT
PACK EYE AFTER SURG (MISCELLANEOUS) ×2 IMPLANT
PACK OPTHALMIC (MISCELLANEOUS) ×2 IMPLANT
PACK PORFILIO (MISCELLANEOUS) ×2 IMPLANT
SYR 3ML LL SCALE MARK (SYRINGE) ×2 IMPLANT
SYR TB 1ML LUER SLIP (SYRINGE) ×2 IMPLANT
WATER STERILE IRR 250ML POUR (IV SOLUTION) ×2 IMPLANT
WIPE NON LINTING 3.25X3.25 (MISCELLANEOUS) ×2 IMPLANT

## 2018-11-29 NOTE — Transfer of Care (Signed)
Immediate Anesthesia Transfer of Care Note  Patient: Jeffrey French  Procedure(s) Performed: CATARACT EXTRACTION PHACO AND INTRAOCULAR LENS PLACEMENT (IOC) RIGHT  00:50.9  17.4%  8.88 (Right Eye)  Patient Location: PACU  Anesthesia Type: MAC  Level of Consciousness: awake, alert  and patient cooperative  Airway and Oxygen Therapy: Patient Spontanous Breathing and Patient connected to supplemental oxygen  Post-op Assessment: Post-op Vital signs reviewed, Patient's Cardiovascular Status Stable, Respiratory Function Stable, Patent Airway and No signs of Nausea or vomiting  Post-op Vital Signs: Reviewed and stable  Complications: No apparent anesthesia complications

## 2018-11-29 NOTE — Op Note (Signed)
PREOPERATIVE DIAGNOSIS:  Nuclear sclerotic cataract of the right eye.   POSTOPERATIVE DIAGNOSIS:  H25.11  CATARACT   OPERATIVE PROCEDURE: Procedure(s): CATARACT EXTRACTION PHACO AND INTRAOCULAR LENS PLACEMENT (IOC) RIGHT  00:50.9  17.4%  8.88   SURGEON:  Birder Robson, MD.   ANESTHESIA:  Anesthesiologist: Ronelle Nigh, MD CRNA: Mayme Genta, CRNA  1.      Managed anesthesia care. 2.      0.40ml of Shugarcaine was instilled in the eye following the paracentesis.   COMPLICATIONS:  None.   TECHNIQUE:   Stop and chop   DESCRIPTION OF PROCEDURE:  The patient was examined and consented in the preoperative holding area where the aforementioned topical anesthesia was applied to the right eye and then brought back to the Operating Room where the right eye was prepped and draped in the usual sterile ophthalmic fashion and a lid speculum was placed. A paracentesis was created with the side port blade and the anterior chamber was filled with viscoelastic. A near clear corneal incision was performed with the steel keratome. A continuous curvilinear capsulorrhexis was performed with a cystotome followed by the capsulorrhexis forceps. Hydrodissection and hydrodelineation were carried out with BSS on a blunt cannula. The lens was removed in a stop and chop  technique and the remaining cortical material was removed with the irrigation-aspiration handpiece. The capsular bag was inflated with viscoelastic and the Technis [id number]  lens was placed in the capsular bag without complication. The remaining viscoelastic was removed from the eye with the irrigation-aspiration handpiece. The wounds were hydrated. The anterior chamber was flushed with BSS and the eye was inflated to physiologic pressure. 0.8ml of Vigamox was placed in the anterior chamber. The wounds were found to be water tight. The eye was dressed with Combigan. The patient was given protective glasses to wear throughout the day and a shield with  which to sleep tonight. The patient was also given drops with which to begin a drop regimen today and will follow-up with me in one day. Implant Name Type Inv. Item Serial No. Manufacturer Lot No. LRB No. Used Action  LENS IOL DIOP 16.0 - [id number] Intraocular Lens LENS IOL DIOP 16.0 [id number]  Right 1 Implanted   Procedure(s): CATARACT EXTRACTION PHACO AND INTRAOCULAR LENS PLACEMENT (IOC) RIGHT  00:50.9  17.4%  8.88 (Right)  Electronically signed: Birder Robson 11/29/2018 12:20 PM

## 2018-11-29 NOTE — Anesthesia Preprocedure Evaluation (Signed)
Anesthesia Evaluation  Patient identified by MRN, date of birth, ID band Patient awake    Reviewed: Allergy & Precautions, H&P , NPO status , Patient's Chart, lab work & pertinent test results  History of Anesthesia Complications Negative for: history of anesthetic complications  Airway Mallampati: II  TM Distance: >3 FB Neck ROM: full    Dental no notable dental hx. (+)    Pulmonary sleep apnea , former smoker,    Pulmonary exam normal breath sounds clear to auscultation       Cardiovascular hypertension, +CHF  Normal cardiovascular exam(-) dysrhythmias (s/p cardioversion, on Eliquis, last dose 11/08/18)  Rhythm:regular Rate:Normal  Dilated cardiomyopathy   Neuro/Psych negative neurological ROS     GI/Hepatic GERD  Medicated and Controlled,  Endo/Other  negative endocrine ROS  Renal/GU negative Renal ROS     Musculoskeletal   Abdominal   Peds  Hematology negative hematology ROS (+)   Anesthesia Other Findings Cardiology note 07/07/18:  Assessment   68 y.o. male with  1. Essential (primary) hypertension  2. Atrial flutter, unspecified type (CMS-HCC)  3. Atrial fibrillation status post cardioversion (CMS-HCC)  4. Cardiomyopathy, dilated (CMS-HCC)  5. Acute on chronic systolic CHF (congestive heart failure) (CMS-HCC)  6. OSA (obstructive sleep apnea)  7. SOB (shortness of breath) on exertion  8. Heart palpitations  9. Status post ablation of atrial fibrillation   68 year old gentleman, diagnosed with atrial flutter, on Eliquis for stroke prevention, who underwent successful cardioversion on 10/09/2015, with short-lived recurrence. Initial 2D echocardiogram revealed dilated cardiomyopathy, while the patient was in atrial flutter, had been asymptomatic without evidence for congestive heart failure. Repeat 2D echocardiogram while the patient was in sinus rhythm revealed LV ejection fraction of 45%. The patient was  recently evaluated at [facility] on 12/06/2017 for shortness of breath and chest pain, treated for pneumonia and pleural effusions, and was successfully cardioverted in the ER. Patient went back in atrial fibrillation, underwent successful catheter ablation 02/16/2018, only to redevelop atrial fibrillation on 02/19/2018 refractory to uptitrating flecainide 200 mg twice daily. The patient underwent successful cardioversion, flecainide transitioned to amiodarone. The patient appears to have remained in sinus rhythm, currently on amiodarone 100 mg daily.  Plan   1. Continue current medication 2. Continue Eliquis for stroke prevention 3. Counseled patient about low-sodium diet 4. DASH diet printed instructions given to the patient 5. Continue low-dose amiodarone 6. Return to clinic for follow-up in 4 months at which time we will DC amiodarone  No orders of the defined types were placed in this encounter.  Return in about 4 months (around 11/06/2018).   Isaias Cowman, MD PhD [facility]    Reproductive/Obstetrics                             Anesthesia Physical  Anesthesia Plan  ASA: II  Anesthesia Plan: MAC   Post-op Pain Management:    Induction: Intravenous  PONV Risk Score and Plan: 1 and TIVA, Midazolam and Treatment may vary due to age or medical condition  Airway Management Planned: Natural Airway  Additional Equipment:   Intra-op Plan:   Post-operative Plan:   Informed Consent: I have reviewed the patients History and Physical, chart, labs and discussed the procedure including the risks, benefits and alternatives for the proposed anesthesia with the patient or authorized representative who has indicated his/her understanding and acceptance.       Plan Discussed with: CRNA  Anesthesia Plan Comments:  Anesthesia Quick Evaluation

## 2018-11-29 NOTE — H&P (Signed)
All labs reviewed. Abnormal studies sent to patients PCP when indicated.  Previous H&P reviewed, patient examined, there are NO CHANGES.  Jeffrey French Porfilio8/25/202011:50 AM

## 2018-11-29 NOTE — Anesthesia Postprocedure Evaluation (Signed)
Anesthesia Post Note  Patient: Jeffrey French  Procedure(s) Performed: CATARACT EXTRACTION PHACO AND INTRAOCULAR LENS PLACEMENT (IOC) RIGHT  00:50.9  17.4%  8.88 (Right Eye)  Patient location during evaluation: PACU Anesthesia Type: MAC Level of consciousness: awake and alert and oriented Pain management: satisfactory to patient Vital Signs Assessment: post-procedure vital signs reviewed and stable Respiratory status: spontaneous breathing, nonlabored ventilation and respiratory function stable Cardiovascular status: blood pressure returned to baseline and stable Postop Assessment: Adequate PO intake and No signs of nausea or vomiting Anesthetic complications: no    Raliegh Ip

## 2018-11-29 NOTE — Anesthesia Procedure Notes (Signed)
Procedure Name: MAC Performed by: Danny Yackley, CRNA Pre-anesthesia Checklist: Patient identified, Emergency Drugs available, Suction available, Timeout performed and Patient being monitored Patient Re-evaluated:Patient Re-evaluated prior to induction Oxygen Delivery Method: Nasal cannula Placement Confirmation: positive ETCO2       

## 2018-11-30 ENCOUNTER — Encounter: Payer: Self-pay | Admitting: Ophthalmology

## 2018-12-06 ENCOUNTER — Encounter: Payer: Self-pay | Admitting: Family Medicine

## 2018-12-06 ENCOUNTER — Other Ambulatory Visit: Payer: Self-pay

## 2018-12-06 ENCOUNTER — Ambulatory Visit (INDEPENDENT_AMBULATORY_CARE_PROVIDER_SITE_OTHER): Payer: Medicare Other | Admitting: Family Medicine

## 2018-12-06 VITALS — BP 108/62 | HR 88 | Temp 99.5°F | Resp 16 | Ht 75.0 in | Wt 222.0 lb

## 2018-12-06 DIAGNOSIS — G4733 Obstructive sleep apnea (adult) (pediatric): Secondary | ICD-10-CM

## 2018-12-06 DIAGNOSIS — F3341 Major depressive disorder, recurrent, in partial remission: Secondary | ICD-10-CM

## 2018-12-06 DIAGNOSIS — I482 Chronic atrial fibrillation, unspecified: Secondary | ICD-10-CM | POA: Diagnosis not present

## 2018-12-06 DIAGNOSIS — G319 Degenerative disease of nervous system, unspecified: Secondary | ICD-10-CM

## 2018-12-06 DIAGNOSIS — G3184 Mild cognitive impairment, so stated: Secondary | ICD-10-CM

## 2018-12-06 DIAGNOSIS — R42 Dizziness and giddiness: Secondary | ICD-10-CM | POA: Diagnosis not present

## 2018-12-06 DIAGNOSIS — R2681 Unsteadiness on feet: Secondary | ICD-10-CM

## 2018-12-06 NOTE — Patient Instructions (Signed)
Decrease Toprol XL from 50mg  to 25mg .

## 2018-12-06 NOTE — Progress Notes (Signed)
Patient: Jeffrey French Male    DOB: 1950/06/16   68 y.o.   MRN: [id number] Visit Date: 12/06/2018  Today's Provider: [name], MD   Chief Complaint  Patient presents with  . Dizziness  . Shortness of Breath    with exterion   Subjective:    HPI  Patient comes in today c/o dizziness and weakness that has worsened in the last week. Patient's wife reports that he has fallen 5 times in the last 4 days. Patient reports that the symptoms happen mainly at bedtime, going from sitting to standing position.  She has not come with him to previous visits. She says his balance and his cognition are worsening. He has h/o Aflutter 2017. EF 45%.moderate aortic and mitral regurge.Cardioverted but 11/19 pt presented with afib. Last EF 30%There have been some probable lifestyle compliance issues here. I have discussed with him the great importance of following the treatment plan exactly as directed in order to achieve a good medical outcome. He is now off of amlodipine. He has seen neurology and has nad diffuse cerbral and cerebellar atrophy noted. Orthostatic VS for the past 24 hrs (Last 3 readings):  BP- Lying Pulse- Lying BP- Sitting Pulse- Sitting BP- Standing at 0 minutes Pulse- Standing at 0 minutes  12/06/18 1500 122/70 96 108/62 88 104/62 100     BP Readings from Last 3 Encounters:  12/06/18 108/62  11/29/18 113/66  11/08/18 107/66   Wt Readings from Last 3 Encounters:  12/06/18 222 lb (100.7 kg)  11/29/18 220 lb 12.8 oz (100.2 kg)  11/08/18 221 lb (100.2 kg)     Allergies  Allergen Reactions  . Sulfamethoxazole-Trimethoprim Rash    (in mouth)     Current Outpatient Medications:  .  alprazolam (XANAX) 2 MG tablet, Take 0.5 tablets (1 mg total) by mouth as needed., Disp: 30 tablet, Rfl: 0 .  amLODipine (NORVASC) 5 MG tablet, Take by mouth., Disp: , Rfl:  .  apixaban (ELIQUIS) 5 MG TABS tablet, Take 5 mg by mouth 2 (two) times daily., Disp: , Rfl:  .  buPROPion  (WELLBUTRIN XL) 300 MG 24 hr tablet, TAKE 1 TABLET BY MOUTH EVERY DAY., Disp: 90 tablet, Rfl: 3 .  Cholecalciferol (VITAMIN D) 2000 UNITS CAPS, Take 1 capsule by mouth daily. , Disp: , Rfl:  .  finasteride (PROSCAR) 5 MG tablet, Take 1 tablet (5 mg total) by mouth daily., Disp: 30 tablet, Rfl: 11 .  folic acid (FOLVITE) Q000111Q MCG tablet, Take 800 mcg by mouth daily. , Disp: , Rfl:  .  furosemide (LASIX) 20 MG tablet, TAKE 1 TABLET (20 MG TOTAL) BY MOUTH ONCE DAILY. TAKE AN ADDITIONAL TABLET ASNEEDED FOR WEIGHT GAIN GREATER THAN 2 POUNDS A NIGHT., Disp: , Rfl:  .  HYDROcodone-acetaminophen (NORCO/VICODIN) 5-325 MG tablet, Take 1 tablet by mouth daily., Disp: , Rfl:  .  lisinopril (PRINIVIL,ZESTRIL) 5 MG tablet, Take by mouth., Disp: , Rfl:  .  Melatonin 1 MG TABS, Take 2 mg by mouth at bedtime. , Disp: , Rfl:  .  metaxalone (SKELAXIN) 800 MG tablet, Take 800 mg by mouth daily., Disp: , Rfl:  .  metoprolol succinate (TOPROL-XL) 50 MG 24 hr tablet, TAKE 1 TABLET BY MOUTH DAILY., Disp: 90 tablet, Rfl: 3 .  omeprazole (PRILOSEC) 20 MG capsule, TAKE 1 CAPSULE BY MOUTH EVERY DAY., Disp: 90 capsule, Rfl: 3 .  spironolactone (ALDACTONE) 25 MG tablet, Take by mouth., Disp: , Rfl:  .  tamsulosin (FLOMAX) 0.4 MG CAPS capsule, Take 1 capsule (0.4 mg total) by mouth daily., Disp: 30 capsule, Rfl: 11 .  temazepam (RESTORIL) 30 MG capsule, TAKE 1 CAPSULE BY MOUTH EVERY DAY FOR SLEEP, Disp: 30 capsule, Rfl: 5 .  venlafaxine XR (EFFEXOR-XR) 75 MG 24 hr capsule, TAKE 3 CAPSULES BY MOUTH EVERY MORNING, Disp: 270 capsule, Rfl: 3  Review of Systems  Constitutional: Positive for activity change and fatigue. Negative for unexpected weight change.  Eyes: Negative.   Respiratory: Positive for shortness of breath. Negative for apnea, cough, chest tightness and wheezing.   Cardiovascular: Negative for chest pain and palpitations.  Gastrointestinal: Negative.   Endocrine: Negative.   Genitourinary: Negative.    Musculoskeletal: Positive for arthralgias and gait problem.  Neurological: Positive for dizziness, weakness and light-headedness.  Hematological: Negative.   Psychiatric/Behavioral: Positive for confusion. Negative for agitation, decreased concentration, self-injury, sleep disturbance and suicidal ideas. The patient is not nervous/anxious.     Social History   Tobacco Use  . Smoking status: Former Smoker    Types: Cigarettes    Quit date: 1974    Years since quitting: 46.6  . Smokeless tobacco: Never Used  . Tobacco comment: 1974  Substance Use Topics  . Alcohol use: Yes    Alcohol/week: 0.0 - 2.0 standard drinks      Objective:   BP 108/62   Pulse 88   Temp 99.5 F (37.5 C)   Resp 16   Ht 6\' 3"  (1.905 m)   Wt 222 lb (100.7 kg)   SpO2 99%   BMI 27.75 kg/m  Vitals:   12/06/18 1441  BP: 108/62  Pulse: 88  Resp: 16  Temp: 99.5 F (37.5 C)  SpO2: 99%  Weight: 222 lb (100.7 kg)  Height: 6\' 3"  (1.905 m)  Body mass index is 27.75 kg/m.   Physical Exam Vitals signs reviewed.  Constitutional:      Appearance: He is well-developed.  HENT:     Head: Normocephalic and atraumatic.  Eyes:     General: No scleral icterus.    Conjunctiva/sclera: Conjunctivae normal.  Neck:     Thyroid: No thyromegaly.  Cardiovascular:     Rate and Rhythm: Normal rate and regular rhythm.     Heart sounds: Normal heart sounds.  Pulmonary:     Effort: Pulmonary effort is normal.     Breath sounds: Normal breath sounds.  Abdominal:     Palpations: Abdomen is soft.  Skin:    General: Skin is warm and dry.  Neurological:     Mental Status: He is alert and oriented to person, place, and time.     Cranial Nerves: No cranial nerve deficit.     Comments: Gait unsteady.  Psychiatric:        Behavior: Behavior normal.        Thought Content: Thought content normal.        Judgment: Judgment normal.     Comments: Mood /affect flat.   ECG--NSR with poor R wave progression.   No  results found for any visits on 12/06/18.     Assessment & Plan    1. Dizziness Multifactorial possibly. I think progression of neurologic disease most likely. Does not sound cardiac. - EKG 12-Lead  2. Unsteady gait Fall risk very high. More than 50% 245 minute visit spent in counseling or coordination of care  - Ambulatory referral to Physical Therapy/also refer back to neurology  3. Chronic atrial fibrillation NSR today. Decrease Toprol fro  50 to 25 for relative hypotension.  4. OSA (obstructive sleep apnea)   5. Diffuse cerebral atrophy [facility]) Per neurology  6. MCI (mild cognitive impairment) MMSE 30/30 today but I think pt having a tough time. Could be frontal lobe issue also.,  7. Recurrent major depressive disorder, in partial remission (HCC) On max dose Effexor/Wellbutrin.      Richard Cranford Mon, MD  [facility]

## 2018-12-07 ENCOUNTER — Other Ambulatory Visit: Payer: Self-pay | Admitting: Family Medicine

## 2018-12-07 DIAGNOSIS — N400 Enlarged prostate without lower urinary tract symptoms: Secondary | ICD-10-CM

## 2018-12-19 ENCOUNTER — Ambulatory Visit: Payer: Medicare Other | Attending: Family Medicine | Admitting: Physical Therapy

## 2018-12-19 ENCOUNTER — Other Ambulatory Visit: Payer: Self-pay

## 2018-12-19 ENCOUNTER — Encounter: Payer: Self-pay | Admitting: Physical Therapy

## 2018-12-19 DIAGNOSIS — R2681 Unsteadiness on feet: Secondary | ICD-10-CM | POA: Diagnosis not present

## 2018-12-19 DIAGNOSIS — R29898 Other symptoms and signs involving the musculoskeletal system: Secondary | ICD-10-CM | POA: Diagnosis not present

## 2018-12-19 DIAGNOSIS — R2689 Other abnormalities of gait and mobility: Secondary | ICD-10-CM | POA: Diagnosis not present

## 2018-12-20 NOTE — Therapy (Signed)
[facility] [address] [place], [place], [address] Phone: [phone]   Fax:  [phone]  Physical Therapy Evaluation  Patient Details  Name: Jeffrey French MRN: [id number] Date of Birth: 1950-10-08 Referring Provider (PT): Dr. Rosanna Randy   Encounter Date: 12/19/2018  PT End of Session - 12/20/18 0949    Visit Number  1    Number of Visits  8    Date for PT Re-Evaluation  01/16/19    Authorization - Visit Number  1    Authorization - Number of Visits  10    PT Start Time  1119    PT Stop Time  1210    PT Time Calculation (min)  51 min    Equipment Utilized During Treatment  Gait belt    Activity Tolerance  Patient tolerated treatment well    Behavior During Therapy  [facility] for tasks assessed/performed       Past Medical History:  Diagnosis Date  . A-fib ([place])    resolved with ablation  . BPH (benign prostatic hyperplasia)   . GERD (gastroesophageal reflux disease)   . Hypertension   . Numbness of foot    Bilateral. since knee replacements  . Sleep apnea    moderate.  unable to tolerate CPAP    Past Surgical History:  Procedure Laterality Date  . ATRIAL FIBRILLATION ABLATION  02/26/2018   [facility]  . CATARACT EXTRACTION W/PHACO Left 11/08/2018   Procedure: CATARACT EXTRACTION PHACO AND INTRAOCULAR LENS PLACEMENT ([place]) LEFT;  Surgeon: Birder Robson, MD;  Location: [place];  Service: Ophthalmology;  Laterality: Left;  sleep apnea not a [place] per [place]  . CATARACT EXTRACTION W/PHACO Right 11/29/2018   Procedure: CATARACT EXTRACTION PHACO AND INTRAOCULAR LENS PLACEMENT ([facility]) RIGHT  00:50.9  17.4%  8.88;  Surgeon: Birder Robson, MD;  Location: [place];  Service: Ophthalmology;  Laterality: Right;  . ELECTROPHYSIOLOGIC STUDY N/A 10/09/2015   Procedure: Cardioversion;  Surgeon: Isaias Cowman, MD;  Location: [facility];  Service: Cardiovascular;  Laterality: N/A;  . NASAL RECONSTRUCTION WITH SEPTAL  REPAIR    . REPLACEMENT TOTAL KNEE Right   . SHOULDER SURGERY Left   . TONSILLECTOMY      There were no vitals filed for this visit.   Subjective Assessment - 12/19/18 1521    Subjective  Pt is a 68 y/o male with c/c of balance impairments.  Pt reports frequent falls due to inability to self-correct balance disturbances, primarily on the L leg.  Pt reports rib fracture from a falling in his home and landing on a clothes basket several years ago.  Pt reports unable to stand on single leg with balance impairments on L>R.  Pt reports experiencing dizziness when standing up too fast; reduces dizziness by sitting up for ~10 sec before standing.  Pt reports sensory impairments; feet "feel numb."  Pt reports 0/10 pain.  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  Pt reports no past balance PT but has had PT post B TKA >6 years ago.  Pt lives at home with wife in single story home with 4 steps to entrance.  [age] (age norm 29).    Pertinent History  Dizziness, Unsteady gait, Hx of falls, Chronic Atrial Fibrillation, Obstructive Sleep Apnea, Diffuse cerebral atrophy, Mild cognitive impairment, Recurrent major depressive disorder,  B TKA >6years ago    Limitations  Standing;Walking;House hold activities    Patient Stated Goals  Improve walking/ balance.   Decrease fall risk    Currently in Pain?  No/denies       Posture: Pt stands with L lateral lean/weight shift in standing.  Pt stands with hands crossed posture despite postural swaying.  Gait: Pt ambulates with L lateral lean/weight shift, no arm swing, and inconsistent heel strike/ toe off.  Strength (out of 5): R/L 4/-4 Hip flexion 5/5 Hip abduction 5/5 Hip adduction 5/5 Knee flexion 5/4 Knee extension 5/5 Dorsiflexion 5/5 Plantarflexion  Objective measurements completed on examination: See above  findings.      PT Education - 12/20/18 1358    Education Details  Gait pattern/ arm swing.  Discussed shoewear.  PT will issue HEP next tx. session    Person(s) Educated  Patient    Methods  Explanation;Demonstration          PT Long Term Goals - 12/20/18 1021      PT LONG TERM GOAL #1   Title  Pt will increase FOTO score to 34 (age norm) to indicate improved functional status.    Baseline  FOTO score 58 12/19/2018    Time  4    Period  Weeks    Status  New    Target Date  01/16/19      PT LONG TERM GOAL #2   Title  Pt wil score >45/56 on the Berg Balance Test to demonstrate decreased in fall risk.    Baseline  Berg score 38/56 12/19/2018    Time  4    Period  Weeks    Status  New    Target Date  01/16/19      PT LONG TERM GOAL #3   Title  Pt will perform 5xSTS in <12 seconds indicating increased LE power.    Baseline  5xSTS in 24 seconds 12/19/2018    Time  4    Period  Weeks    Status  New    Target Date  01/16/19      PT LONG TERM GOAL #4   Title  Pt will score >50 on ABC Scale to indicate increased balance confidence and level of functioning.    Baseline  ABC score 42.5 914/2020    Time  4    Period  Weeks    Status  New    Target Date  01/16/19      PT LONG TERM GOAL #5   Title  Pt. will exhibit a consistent heel strike/ arm swing while ambulating on level surfaces to improve mobility.    Baseline  Limited heel strike/ no arm swing.  Staggering gait pattern.    Time  4    Period  Weeks    Status  New    Target Date  01/16/19         Plan - 12/20/18 0949    Clinical Impression Statement  Pt. is a 68 y/o male who presents with significant balance impairments in static and dynamic standing, gait, and turning.  Pt demonstrates an antalgic staggering gait with inconsistent heel strike and toe off without assistive device.  Pt. reports occasionally using a RW at home/ bedside for safety.  Pt demonstrates weight shift/lean to L in standing and with ambulation.   Pt also demonstrates limited arm swing and hand crossed posture in standing and with ambulation.  Pt has frequent balance errors in static standing  and with ambulation and turning.  Pt reports a hx of frequent falls due to inability to "self-correct" balance errors.  Pt reports feet feel "numb."  Pt reports dizziness with standing and turning quickly.  Berg Balance score of 38/56 and FGI score of 10/24 indicate a significant risk for falls.  Pt. LE strength grossly WFL except for hip flexion (L: 4-/5, R: 4/5) and L knee extension (4/5).  Pt performed 5xSTS in 24 seconds demonstrating decrease LE power.  Pt demonstrates decreased cognition and forgetfulness.  Pt with ABC Scale score of 42.5% indicates a low level of functioning and low confidence in balance.  Pt will benefit from skilled therapy to improve balance, reduce fall risk, increase LE strength, and improve gait mechanics.    Personal Factors and Comorbidities  Age;Comorbidity 1;Comorbidity 2    Examination-Activity Limitations  Stand;Stairs;Squat;Locomotion Level;Bend    Stability/Clinical Decision Making  Evolving/Moderate complexity    Clinical Decision Making  Moderate    Rehab Potential  Fair    PT Frequency  2x / week    PT Duration  4 weeks    PT Treatment/Interventions  Gait training;Stair training;ADLs/Self Care Home Management;Functional mobility training;Therapeutic activities;Therapeutic exercise;Balance training;Neuromuscular re-education;Manual techniques;Passive range of motion;Patient/family education    PT Next Visit Plan  Assess sensation and proprioception. Strengthen LEs and balance training.  ISSUE HEP    PT Home Exercise Plan  HEP will be provided at next session    Consulted and Agree with Plan of Care  Patient       Patient will benefit from skilled therapeutic intervention in order to improve the following deficits and impairments:  Decreased balance, Abnormal gait, Decreased endurance, Decreased strength, Decreased  activity tolerance, Decreased mobility, Difficulty walking, Decreased cognition, Postural dysfunction  Visit Diagnosis: Unsteady gait  Leg weakness, bilateral  Balance problem     Problem List Patient Active Problem List   Diagnosis Date Noted  . OSA (obstructive sleep apnea) 05/03/2018  . Acute on chronic systolic CHF (congestive heart failure) ([place]) 02/28/2018  . On amiodarone therapy 02/27/2018  . History of depression 02/26/2018  . Status post ablation of atrial fibrillation 02/17/2018  . Atrial fibrillation ([place]) 01/05/2018  . Heart palpitations 10/21/2017  . Diffuse cerebral atrophy ([place]) 03/20/2017  . SOB (shortness of breath) on exertion 03/19/2017  . New onset atrial flutter ([place]) 09/18/2015  . Irregular heart rhythm 09/17/2015  . Absolute anemia 02/18/2015  . Benign fibroma of prostate 02/18/2015  . Clinical depression 02/18/2015  . Abnormal prostate specific antigen 02/18/2015  . Essential (primary) hypertension 02/18/2015  . Anxiety, generalized 02/18/2015  . Insomnia 02/18/2015  . Arthritis, degenerative 02/18/2015  . Peptic ulcer 02/18/2015  . Elevated prostate specific antigen (PSA) 10/19/2013   Pura Spice, PT, DPT # [id number] Chinita Greenland, SPT 12/20/2018, 2:30 PM  [facility] [address] [place], [place], [address] Phone: [phone]   Fax:  [phone]  Name: RUTHFORD KOZICKI MRN: [id number] Date of Birth: 1950/11/17

## 2018-12-20 NOTE — Therapy (Deleted)
[facility] [address]. [place], [place], [address] Phone: [phone]   Fax:  [phone]  Physical Therapy Evaluation  Patient Details  Name: Jeffrey French MRN: [id number] Date of Birth: Oct 13, 1950 Referring Provider (PT): Dr. Rosanna Randy   Encounter Date: 12/19/2018  PT End of Session - 12/20/18 0949    Visit Number  1    Number of Visits  8    Date for PT Re-Evaluation  01/16/19    Authorization - Visit Number  1    Authorization - Number of Visits  10    PT Start Time  1119    PT Stop Time  1210    PT Time Calculation (min)  51 min    Equipment Utilized During Treatment  Gait belt    Activity Tolerance  Patient tolerated treatment well    Behavior During Therapy  [facility] for tasks assessed/performed       Past Medical History:  Diagnosis Date  . A-fib ([place])    resolved with ablation  . BPH (benign prostatic hyperplasia)   . GERD (gastroesophageal reflux disease)   . Hypertension   . Numbness of foot    Bilateral. since knee replacements  . Sleep apnea    moderate.  unable to tolerate CPAP    Past Surgical History:  Procedure Laterality Date  . ATRIAL FIBRILLATION ABLATION  02/26/2018   [facility]  . CATARACT EXTRACTION W/PHACO Left 11/08/2018   Procedure: CATARACT EXTRACTION PHACO AND INTRAOCULAR LENS PLACEMENT ([place]) LEFT;  Surgeon: Birder Robson, MD;  Location: [facility];  Service: Ophthalmology;  Laterality: Left;  sleep apnea not a Toric per [place]  . CATARACT EXTRACTION W/PHACO Right 11/29/2018   Procedure: CATARACT EXTRACTION PHACO AND INTRAOCULAR LENS PLACEMENT ([facility]) RIGHT  00:50.9  17.4%  8.88;  Surgeon: Birder Robson, MD;  Location: [facility];  Service: Ophthalmology;  Laterality: Right;  . ELECTROPHYSIOLOGIC STUDY N/A 10/09/2015   Procedure: Cardioversion;  Surgeon: Isaias Cowman, MD;  Location: [facility];  Service: Cardiovascular;  Laterality: N/A;  . NASAL RECONSTRUCTION WITH SEPTAL  REPAIR    . REPLACEMENT TOTAL KNEE Right   . SHOULDER SURGERY Left   . TONSILLECTOMY      There were no vitals filed for this visit.   Subjective Assessment - 12/19/18 1521    Subjective  Pt is a 68 y/o male with c/c of balance impairments.  Pt reports frequent falls due to inability to self-correct balance disturbances, primarily on the L leg.  Pt reports rib fracture from a falling in his home and landing on a clothes basket several years ago.  Pt reports unable to stand on single leg with balance impairments on L>R.  Pt reports experiencing dizziness when standing up too fast; reduces dizziness by sitting up for ~10 sec before standing.  Pt reports sensory impairments; feet "feel numb."  Pt reports 0/10 pain.  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  Pt reports no past balance PT but has had PT post B TKA >6 years ago.  Pt lives at home with wife in single story home with 4 steps to entrance.  [age] (age norm 1).    Pertinent History  Dizziness, Unsteady gait, Hx of falls, Chronic Atrial Fibrillation, Obstructive Sleep Apnea, Diffuse cerebral atrophy, Mild cognitive impairment, Recurrent major depressive disorder,  B TKA >6years ago    Limitations  Standing;Walking;House hold activities    Patient Stated Goals  Improve walking/ balance.   Decrease fall risk    Currently in Pain?  No/denies                    Objective measurements completed on examination: See above findings.      PT Education - 12/20/18 1358    Education Details  Gait pattern/ arm swing.  Discussed shoewear.  PT will issue HEP next tx. session    Person(s) Educated  Patient    Methods  Explanation;Demonstration          PT Long Term Goals - 12/20/18 1021      PT LONG TERM GOAL #1   Title  Pt will increase FOTO score to 68 (age norm) to indicate improved  functional status.    Baseline  FOTO score 58 12/19/2018    Time  4    Period  Weeks    Status  New    Target Date  01/16/19      PT LONG TERM GOAL #2   Title  Pt wil score >45/56 on the Berg Balance Test to demonstrate decreased in fall risk.    Baseline  Berg score 38/56 12/19/2018    Time  4    Period  Weeks    Status  New    Target Date  01/16/19      PT LONG TERM GOAL #3   Title  Pt will perform 5xSTS in <12 seconds indicating increased LE power.    Baseline  5xSTS in 24 seconds 12/19/2018    Time  4    Period  Weeks    Status  New    Target Date  01/16/19      PT LONG TERM GOAL #4   Title  Pt will score >50 on ABC Scale to indicate increased balance confidence and level of functioning.    Baseline  ABC score 42.5 914/2020    Time  4    Period  Weeks    Status  New    Target Date  01/16/19      PT LONG TERM GOAL #5   Title  Pt. will exhibit a consistent heel strike/ arm swing while ambulating on level surfaces to improve mobility.    Baseline  Limited heel strike/ no arm swing.  Staggering gait pattern.    Time  4    Period  Weeks    Status  New    Target Date  01/16/19         Plan - 12/20/18 0949    Clinical Impression Statement  Pt. is a 68 y/o male who presents with significant balance impairments in static and dynamic standing, gait, and turning.  Pt demonstrates an antalgic staggering gait with inconsistent heel strike and toe off without assistive device.  Pt. reports occasionally using a RW at home/ bedside for safety.  Pt demonstrates weight shift/lean to L in standing and with ambulation.  Pt also demonstrates limited arm swing and hand crossed posture in standing and with ambulation.  Pt has frequent balance errors in static standing and with ambulation and turning.  Pt reports a hx of frequent falls due to inability to "self-correct" balance errors.  Pt reports feet feel "numb."  Pt reports dizziness with standing and turning quickly.  Berg Balance score of  38/56 and FGI score of 10/24 indicate a significant risk for  falls.  Pt. LE strength grossly WFL except for hip flexion (L: 4-/5, R: 4/5) and L knee extension (4/5).  Pt performed 5xSTS in 24 seconds demonstrating decrease LE power.  Pt demonstrates decreased cognition and forgetfulness.  Pt with ABC Scale score of 42.5% indicates a low level of functioning and low confidence in balance.  Pt will benefit from skilled therapy to improve balance, reduce fall risk, increase LE strength, and improve gait mechanics.    Personal Factors and Comorbidities  Age;Comorbidity 1;Comorbidity 2    Examination-Activity Limitations  Stand;Stairs;Squat;Locomotion Level;Bend    Stability/Clinical Decision Making  Evolving/Moderate complexity    Clinical Decision Making  Moderate    Rehab Potential  Fair    PT Frequency  2x / week    PT Duration  4 weeks    PT Treatment/Interventions  Gait training;Stair training;ADLs/Self Care Home Management;Functional mobility training;Therapeutic activities;Therapeutic exercise;Balance training;Neuromuscular re-education;Manual techniques;Passive range of motion;Patient/family education    PT Next Visit Plan  Assess sensation and proprioception. Strengthen LEs and balance training.  ISSUE HEP    PT Home Exercise Plan  HEP will be provided at next session    Consulted and Agree with Plan of Care  Patient       Patient will benefit from skilled therapeutic intervention in order to improve the following deficits and impairments:  Decreased balance, Abnormal gait, Decreased endurance, Decreased strength, Decreased activity tolerance, Decreased mobility, Difficulty walking, Decreased cognition, Postural dysfunction  Visit Diagnosis: Unsteady gait  Leg weakness, bilateral  Balance problem     Problem List Patient Active Problem List   Diagnosis Date Noted  . OSA (obstructive sleep apnea) 05/03/2018  . Acute on chronic systolic CHF (congestive heart failure) ([place])  02/28/2018  . On amiodarone therapy 02/27/2018  . History of depression 02/26/2018  . Status post ablation of atrial fibrillation 02/17/2018  . Atrial fibrillation ([place]) 01/05/2018  . Heart palpitations 10/21/2017  . Diffuse cerebral atrophy ([place]) 03/20/2017  . SOB (shortness of breath) on exertion 03/19/2017  . New onset atrial flutter ([place]) 09/18/2015  . Irregular heart rhythm 09/17/2015  . Absolute anemia 02/18/2015  . Benign fibroma of prostate 02/18/2015  . Clinical depression 02/18/2015  . Abnormal prostate specific antigen 02/18/2015  . Essential (primary) hypertension 02/18/2015  . Anxiety, generalized 02/18/2015  . Insomnia 02/18/2015  . Arthritis, degenerative 02/18/2015  . Peptic ulcer 02/18/2015  . Elevated prostate specific antigen (PSA) 10/19/2013    Pura Spice 12/20/2018, 2:08 PM  [facility] [address] [place], [place], [address] Phone: [phone]   Fax:  [phone]  Name: AVRI PAOLICELLI MRN: [id number] Date of Birth: 1950-12-12

## 2018-12-21 ENCOUNTER — Encounter: Payer: Self-pay | Admitting: Physical Therapy

## 2018-12-21 ENCOUNTER — Ambulatory Visit: Payer: Medicare Other | Admitting: Physical Therapy

## 2018-12-21 ENCOUNTER — Other Ambulatory Visit: Payer: Self-pay

## 2018-12-21 DIAGNOSIS — R2681 Unsteadiness on feet: Secondary | ICD-10-CM | POA: Diagnosis not present

## 2018-12-21 DIAGNOSIS — R2689 Other abnormalities of gait and mobility: Secondary | ICD-10-CM | POA: Diagnosis not present

## 2018-12-21 DIAGNOSIS — R29898 Other symptoms and signs involving the musculoskeletal system: Secondary | ICD-10-CM | POA: Diagnosis not present

## 2018-12-21 NOTE — Patient Instructions (Signed)
Access Code: [email or web address]  URL: [email or web address]  Date: 12/21/2018  Prepared by: Dorcas Carrow   Exercises  Standing Single Leg Stance with Counter Support - 2 reps - 1 sets - 30 hold - 1x daily - 7x weekly  Seated Long Arc Quad - 20 reps - 1 sets - 2x daily - 4x weekly  Mini Squat with Counter Support - 20 reps - 1 sets - 2x daily - 4x weekly  Standing March with Unilateral Counter Support - 20 reps - 1 sets - 2x daily - 4x weekly  Heel rises with counter support - 20 reps - 1 sets - 2x daily - 4x weekly

## 2018-12-21 NOTE — Therapy (Signed)
[facility] [address] [place], [place], [address] Phone: [phone]   Fax:  [phone]  Physical Therapy Treatment  Patient Details  Name: Jeffrey French MRN: [id number] Date of Birth: 04/13/1950 Referring Provider (PT): Dr. Rosanna Randy   Encounter Date: 12/21/2018  PT End of Session - 12/21/18 1530    Visit Number  2    Number of Visits  8    Date for PT Re-Evaluation  01/16/19    Authorization - Visit Number  2    Authorization - Number of Visits  10    PT Start Time  [id number]    PT Stop Time  1142    PT Time Calculation (min)  55 min    Equipment Utilized During Treatment  Gait belt    Activity Tolerance  Patient tolerated treatment well    Behavior During Therapy  [facility] for tasks assessed/performed       Past Medical History:  Diagnosis Date  . A-fib ([place])    resolved with ablation  . BPH (benign prostatic hyperplasia)   . GERD (gastroesophageal reflux disease)   . Hypertension   . Numbness of foot    Bilateral. since knee replacements  . Sleep apnea    moderate.  unable to tolerate CPAP    Past Surgical History:  Procedure Laterality Date  . ATRIAL FIBRILLATION ABLATION  02/26/2018   [facility]  . CATARACT EXTRACTION W/PHACO Left 11/08/2018   Procedure: CATARACT EXTRACTION PHACO AND INTRAOCULAR LENS PLACEMENT ([place]) LEFT;  Surgeon: Birder Robson, MD;  Location: [facility];  Service: Ophthalmology;  Laterality: Left;  sleep apnea not a Toric per [facility]  . CATARACT EXTRACTION W/PHACO Right 11/29/2018   Procedure: CATARACT EXTRACTION PHACO AND INTRAOCULAR LENS PLACEMENT ([facility]) RIGHT  00:50.9  17.4%  8.88;  Surgeon: Birder Robson, MD;  Location: [facility];  Service: Ophthalmology;  Laterality: Right;  . ELECTROPHYSIOLOGIC STUDY N/A 10/09/2015   Procedure: Cardioversion;  Surgeon: Isaias Cowman, MD;  Location: [facility];  Service: Cardiovascular;  Laterality: N/A;  . NASAL RECONSTRUCTION WITH SEPTAL  REPAIR    . REPLACEMENT TOTAL KNEE Right   . SHOULDER SURGERY Left   . TONSILLECTOMY      There were no vitals filed for this visit.  Subjective Assessment - 12/21/18 1521    Subjective  Pt reports 0/10 pain.  Pt still experiencing numbness on distal dorsal and plantar aspects of foot.  Pt. wearing differnent pair of sneakers today.    Pertinent History  Dizziness, Unsteady gait, Hx of falls, Chronic Atrial Fibrillation, Obstructive Sleep Apnea, Diffuse cerebral atrophy, Mild cognitive impairment, Recurrent major depressive disorder, B TKA >6years ago    Limitations  Standing;Walking;House hold activities    Patient Stated Goals  Improve walking/ balance.   Decrease fall risk    Currently in Pain?  No/denies      Sensation: LE sensation grossly intact to light touch except for L: decreased sensation at L5 in comparison to R R: decreased sensation at S1 in comparison to L  Proprioception: Unimpaired  Special Tests: Heel Raise Test: R 10, L 10 with decreased ROM B  Neuro: Gait in hallway - x2 Marching gait in hallway - x2 Marching gait with alternate touch hand to opposite knee in hallway - x2 Gait with head turns in hallway - x4 Standing balance - 30 sec hold, no UE support Standing B heel raise - x25 Standing B heel raise, no shoes - x25 Standing single leg  heel raise, no shoes - 2x10 B Standing balance, no shoes - 30 sec hold Single leg balance, no shoes - 2x30 sec B, frequent UE support Standing balance on airex, no shoes - 2x30 sec, intermittent UE support Tandem standing on airex, no shoes - 2x30 sec B, frequent UE support Single leg stance on airex, no shoes - 2x30 sec B, frequent UE support  Therapeutic Exercise: Seated marching with 3# ankle weights - 2x20 Seated long arc quads with 3# ankle weights - 2x20 Partial squats with chair behind for safety - x10     PT Long Term Goals - 12/20/18 1021      PT LONG TERM GOAL #1   Title  Pt will increase FOTO score to  78 (age norm) to indicate improved functional status.    Baseline  FOTO score 58 12/19/2018    Time  4    Period  Weeks    Status  New    Target Date  01/16/19      PT LONG TERM GOAL #2   Title  Pt wil score >45/56 on the Berg Balance Test to demonstrate decreased in fall risk.    Baseline  Berg score 38/56 12/19/2018    Time  4    Period  Weeks    Status  New    Target Date  01/16/19      PT LONG TERM GOAL #3   Title  Pt will perform 5xSTS in <12 seconds indicating increased LE power.    Baseline  5xSTS in 24 seconds 12/19/2018    Time  4    Period  Weeks    Status  New    Target Date  01/16/19      PT LONG TERM GOAL #4   Title  Pt will score >50 on ABC Scale to indicate increased balance confidence and level of functioning.    Baseline  ABC score 42.5 914/2020    Time  4    Period  Weeks    Status  New    Target Date  01/16/19      PT LONG TERM GOAL #5   Title  Pt. will exhibit a consistent heel strike/ arm swing while ambulating on level surfaces to improve mobility.    Baseline  Limited heel strike/ no arm swing.  Staggering gait pattern.    Time  4    Period  Weeks    Status  New    Target Date  01/16/19          Plan - 12/21/18 1601    Clinical Impression Statement  Pt still demonstrates staggering gait with inconsistent heel strike/ toe off and L leaning.  L leaning is also present in static standing.  Pt is more unstable on L>R in stance during gait activites and single leg balance exercises. With head turns during gait, pt exhibites gait deviations ipsilateral to the direction of head turns.  Pt demonstrates stepping strategies for balance errors in gait activities.  Pt requires frequent UE support and CGA during balance exercises.  Pt requires min assist to prevent LOB with single leg stance on airex pad, L>R.  Pt requires increased UE support and CGA with balance activities without shoes than activities with shoes.  Sensation testing reveals pt has decreased  sensation on L at L5 and R at S1.  Heel raise testing reveals weakness in B plantarflexors.  Pt reports dizziness with standing quickly, resolves with prolonged standing.  Pt will continue  to benefit from skilled therapy to improve balance, increase LE strength, and reduce risk of falls.    Personal Factors and Comorbidities  Age;Comorbidity 1;Comorbidity 2    Examination-Activity Limitations  Stand;Stairs;Squat;Locomotion Level;Bend    Stability/Clinical Decision Making  Evolving/Moderate complexity    Clinical Decision Making  Moderate    Rehab Potential  Fair    PT Frequency  2x / week    PT Duration  4 weeks    PT Treatment/Interventions  Gait training;Stair training;ADLs/Self Care Home Management;Functional mobility training;Therapeutic activities;Therapeutic exercise;Balance training;Neuromuscular re-education;Manual techniques;Passive range of motion;Patient/family education    PT Next Visit Plan  Continue balance training and LE strengthening    PT Home Exercise Plan  See HEP    Consulted and Agree with Plan of Care  Patient       Patient will benefit from skilled therapeutic intervention in order to improve the following deficits and impairments:  Decreased balance, Abnormal gait, Decreased endurance, Decreased strength, Decreased activity tolerance, Decreased mobility, Difficulty walking, Decreased cognition, Postural dysfunction  Visit Diagnosis: Unsteady gait  Leg weakness, bilateral  Balance problem     Problem List Patient Active Problem List   Diagnosis Date Noted  . OSA (obstructive sleep apnea) 05/03/2018  . Acute on chronic systolic CHF (congestive heart failure) ([place]) 02/28/2018  . On amiodarone therapy 02/27/2018  . History of depression 02/26/2018  . Status post ablation of atrial fibrillation 02/17/2018  . Atrial fibrillation ([place]) 01/05/2018  . Heart palpitations 10/21/2017  . Diffuse cerebral atrophy ([place]) 03/20/2017  . SOB (shortness of breath) on  exertion 03/19/2017  . New onset atrial flutter ([place]) 09/18/2015  . Irregular heart rhythm 09/17/2015  . Absolute anemia 02/18/2015  . Benign fibroma of prostate 02/18/2015  . Clinical depression 02/18/2015  . Abnormal prostate specific antigen 02/18/2015  . Essential (primary) hypertension 02/18/2015  . Anxiety, generalized 02/18/2015  . Insomnia 02/18/2015  . Arthritis, degenerative 02/18/2015  . Peptic ulcer 02/18/2015  . Elevated prostate specific antigen (PSA) 10/19/2013   Pura Spice, PT, DPT # [id number] Chinita [place], SPT 12/22/2018, 10:58 AM  [facility] [address] [place], [place], [address] Phone: [phone]   Fax:  [phone]  Name: Jeffrey French MRN: [id number] Date of Birth: Aug 10, 1950

## 2018-12-22 ENCOUNTER — Encounter: Payer: Self-pay | Admitting: Physical Therapy

## 2018-12-26 ENCOUNTER — Ambulatory Visit: Payer: Medicare Other | Admitting: Physical Therapy

## 2018-12-26 ENCOUNTER — Encounter: Payer: Self-pay | Admitting: Physical Therapy

## 2018-12-26 ENCOUNTER — Other Ambulatory Visit: Payer: Self-pay

## 2018-12-26 DIAGNOSIS — R29898 Other symptoms and signs involving the musculoskeletal system: Secondary | ICD-10-CM | POA: Diagnosis not present

## 2018-12-26 DIAGNOSIS — R2689 Other abnormalities of gait and mobility: Secondary | ICD-10-CM | POA: Diagnosis not present

## 2018-12-26 DIAGNOSIS — R2681 Unsteadiness on feet: Secondary | ICD-10-CM

## 2018-12-26 NOTE — Therapy (Signed)
[facility] [address]. [place], [place], [address] Phone: [phone]   Fax:  [phone]  Physical Therapy Treatment  Patient Details  Name: Jeffrey French MRN: [id number] Date of Birth: 03/27/1951 Referring Provider (PT): Dr. Rosanna Randy   Encounter Date: 12/26/2018  PT End of Session - 12/26/18 1538    Visit Number  3    Number of Visits  8    Date for PT Re-Evaluation  01/16/19    Authorization - Visit Number  3    Authorization - Number of Visits  10    PT Start Time  1059    PT Stop Time  1148    PT Time Calculation (min)  49 min    Equipment Utilized During Treatment  Gait belt    Activity Tolerance  Patient tolerated treatment well    Behavior During Therapy  [facility] for tasks assessed/performed       Past Medical History:  Diagnosis Date  . A-fib ([place])    resolved with ablation  . BPH (benign prostatic hyperplasia)   . GERD (gastroesophageal reflux disease)   . Hypertension   . Numbness of foot    Bilateral. since knee replacements  . Sleep apnea    moderate.  unable to tolerate CPAP    Past Surgical History:  Procedure Laterality Date  . ATRIAL FIBRILLATION ABLATION  02/26/2018   [facility]  . CATARACT EXTRACTION W/PHACO Left 11/08/2018   Procedure: CATARACT EXTRACTION PHACO AND INTRAOCULAR LENS PLACEMENT ([place]) LEFT;  Surgeon: Birder Robson, MD;  Location: [place];  Service: Ophthalmology;  Laterality: Left;  sleep apnea not a Toric per [place]  . CATARACT EXTRACTION W/PHACO Right 11/29/2018   Procedure: CATARACT EXTRACTION PHACO AND INTRAOCULAR LENS PLACEMENT ([facility]) RIGHT  00:50.9  17.4%  8.88;  Surgeon: Birder Robson, MD;  Location: [place];  Service: Ophthalmology;  Laterality: Right;  . ELECTROPHYSIOLOGIC STUDY N/A 10/09/2015   Procedure: Cardioversion;  Surgeon: Isaias Cowman, MD;  Location: [facility];  Service: Cardiovascular;  Laterality: N/A;  . NASAL RECONSTRUCTION WITH SEPTAL  REPAIR    . REPLACEMENT TOTAL KNEE Right   . SHOULDER SURGERY Left   . TONSILLECTOMY      There were no vitals filed for this visit.  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  Pt reports no pain elsewhere. Pt still experiencing numbness in feet. Pt says he did not do his HEP.    Pertinent History  Dizziness, Unsteady gait, Hx of falls, Chronic Atrial Fibrillation, Obstructive Sleep Apnea, Diffuse cerebral atrophy, Mild cognitive impairment, Recurrent major depressive disorder, B TKA >6years ago    Limitations  Standing;Walking;House hold activities    Patient Stated Goals  Improve walking/ balance.   Decrease fall risk    Currently in Pain?  Yes    Pain Score  2     Pain Location  Back    Pain Orientation  Lower    Pain Onset  In the past 7 days        Neuro:  Hallway walking: high marching/ alt. UE and LE touches/ head turning/ working on midline walking with arm swing Tandem standing endurance - 2x34min B with intermittent UE support (able to stand 15 seconds on R, 10 seconds on L) Standing single leg stance endurance - 2x30 sec B (able to hold 5 seconds B) Forward/backward marching in //  bars with 4# ankle weights - 4x (pt with difficulty with maintaining midline gait) Walking in clinic and outdoors on uneven surfaces   Therapeutic Exercise:  Hip abduction with 4# ankle weight - 2x10 B Heel raises with 4# ankle weight - x20 Single leg heel raises with 4# ankle weight - x20 B Hip extension with 4# ankle weight    PT Education - 12/26/18 1537    Education Details  Gait mechanics: arm swing, maintaining midline and larger BOS    Person(s) Educated  Patient    Methods  Explanation;Demonstration;Verbal cues    Comprehension  Verbal cues required;Returned demonstration;Verbalized understanding          PT Long Term Goals - 12/20/18 1021      PT LONG TERM GOAL #1   Title  Pt will  increase FOTO score to 52 (age norm) to indicate improved functional status.    Baseline  FOTO score 58 12/19/2018    Time  4    Period  Weeks    Status  New    Target Date  01/16/19      PT LONG TERM GOAL #2   Title  Pt wil score >45/56 on the Berg Balance Test to demonstrate decreased in fall risk.    Baseline  Berg score 38/56 12/19/2018    Time  4    Period  Weeks    Status  New    Target Date  01/16/19      PT LONG TERM GOAL #3   Title  Pt will perform 5xSTS in <12 seconds indicating increased LE power.    Baseline  5xSTS in 24 seconds 12/19/2018    Time  4    Period  Weeks    Status  New    Target Date  01/16/19      PT LONG TERM GOAL #4   Title  Pt will score >50 on ABC Scale to indicate increased balance confidence and level of functioning.    Baseline  ABC score 42.5 914/2020    Time  4    Period  Weeks    Status  New    Target Date  01/16/19      PT LONG TERM GOAL #5   Title  Pt. will exhibit a consistent heel strike/ arm swing while ambulating on level surfaces to improve mobility.    Baseline  Limited heel strike/ no arm swing.  Staggering gait pattern.    Time  4    Period  Weeks    Status  New    Target Date  01/16/19            Plan - 12/26/18 1539    Clinical Impression Statement  Pt still demonstrates staggering gait with inconsistent heel strike and significant lateral leaning bilaterally.  Pt ambulates with a narrow base of support and presents with frequent gait deviations.  Pt requires frequent cueing for arm swing and maintaining midline gait.  Pt demonstrates increased tandem standing endurance on R>L with increased perception of balance on R>L with most balance activities.  With head turns during gait, pt has gait deviations contralateral to the direction of head turns which contradicts findings at the previous visit suggesting inconsistent instabilities with head turning during gait.  Pt has difficulty understanding directions and requires multiple  verbal commands with limited attention span.    Personal Factors and Comorbidities  Age;Comorbidity 1;Comorbidity 2    Examination-Activity Limitations  Stand;Stairs;Squat;Locomotion Level;Bend    Stability/Clinical Decision  Making  Evolving/Moderate complexity    Clinical Decision Making  Moderate    Rehab Potential  Fair    PT Frequency  2x / week    PT Duration  4 weeks    PT Treatment/Interventions  Gait training;Stair training;ADLs/Self Care Home Management;Functional mobility training;Therapeutic activities;Therapeutic exercise;Balance training;Neuromuscular re-education;Manual techniques;Passive range of motion;Patient/family education    PT Next Visit Plan  Continue balance training and LE strengthening; Gait on uneven surfaces/outdoors. Assess gait using LAD. Update HEP.    PT Home Exercise Plan  See HEP    Consulted and Agree with Plan of Care  Patient       Patient will benefit from skilled therapeutic intervention in order to improve the following deficits and impairments:  Decreased balance, Abnormal gait, Decreased endurance, Decreased strength, Decreased activity tolerance, Decreased mobility, Difficulty walking, Decreased cognition, Postural dysfunction  Visit Diagnosis: Unsteady gait  Leg weakness, bilateral  Balance problem     Problem List Patient Active Problem List   Diagnosis Date Noted  . OSA (obstructive sleep apnea) 05/03/2018  . Acute on chronic systolic CHF (congestive heart failure) ([place]) 02/28/2018  . On amiodarone therapy 02/27/2018  . History of depression 02/26/2018  . Status post ablation of atrial fibrillation 02/17/2018  . Atrial fibrillation ([place]) 01/05/2018  . Heart palpitations 10/21/2017  . Diffuse cerebral atrophy ([place]) 03/20/2017  . SOB (shortness of breath) on exertion 03/19/2017  . New onset atrial flutter ([place]) 09/18/2015  . Irregular heart rhythm 09/17/2015  . Absolute anemia 02/18/2015  . Benign fibroma of prostate 02/18/2015  .  Clinical depression 02/18/2015  . Abnormal prostate specific antigen 02/18/2015  . Essential (primary) hypertension 02/18/2015  . Anxiety, generalized 02/18/2015  . Insomnia 02/18/2015  . Arthritis, degenerative 02/18/2015  . Peptic ulcer 02/18/2015  . Elevated prostate specific antigen (PSA) 10/19/2013   Pura Spice, PT, DPT # [id number] Chinita [place], SPT 12/26/2018, 3:53 PM   [facility] [address] [place], [place], [address] Phone: [phone]   Fax:  [phone]  Name: Jeffrey French MRN: [id number] Date of Birth: Jan 23, 1951

## 2018-12-28 ENCOUNTER — Other Ambulatory Visit: Payer: Self-pay

## 2018-12-28 ENCOUNTER — Ambulatory Visit: Payer: Medicare Other | Admitting: Physical Therapy

## 2018-12-28 ENCOUNTER — Encounter: Payer: Self-pay | Admitting: Physical Therapy

## 2018-12-28 DIAGNOSIS — R29898 Other symptoms and signs involving the musculoskeletal system: Secondary | ICD-10-CM

## 2018-12-28 DIAGNOSIS — R2689 Other abnormalities of gait and mobility: Secondary | ICD-10-CM | POA: Diagnosis not present

## 2018-12-28 DIAGNOSIS — R2681 Unsteadiness on feet: Secondary | ICD-10-CM

## 2018-12-28 NOTE — Therapy (Signed)
[facility] [address] [place], [place], [address] Phone: [phone]   Fax:  [phone]  Physical Therapy Treatment  Patient Details  Name: Jeffrey French MRN: [id number] Date of Birth: 1951/01/21 Referring Provider (PT): Dr. Rosanna Randy   Encounter Date: 12/28/2018  PT End of Session - 12/28/18 1302    Visit Number  4    Number of Visits  8    Date for PT Re-Evaluation  01/16/19    Authorization - Visit Number  4    Authorization - Number of Visits  10    PT Start Time  [id number]    PT Stop Time  1348    PT Time Calculation (min)  55 min    Equipment Utilized During Treatment  Gait belt    Activity Tolerance  Patient tolerated treatment well    Behavior During Therapy  [facility] for tasks assessed/performed       Past Medical History:  Diagnosis Date  . A-fib ([place])    resolved with ablation  . BPH (benign prostatic hyperplasia)   . GERD (gastroesophageal reflux disease)   . Hypertension   . Numbness of foot    Bilateral. since knee replacements  . Sleep apnea    moderate.  unable to tolerate CPAP    Past Surgical History:  Procedure Laterality Date  . ATRIAL FIBRILLATION ABLATION  02/26/2018   [facility]  . CATARACT EXTRACTION W/PHACO Left 11/08/2018   Procedure: CATARACT EXTRACTION PHACO AND INTRAOCULAR LENS PLACEMENT ([place]) LEFT;  Surgeon: Birder Robson, MD;  Location: [place];  Service: Ophthalmology;  Laterality: Left;  sleep apnea not a Toric per [place]  . CATARACT EXTRACTION W/PHACO Right 11/29/2018   Procedure: CATARACT EXTRACTION PHACO AND INTRAOCULAR LENS PLACEMENT ([facility]) RIGHT  00:50.9  17.4%  8.88;  Surgeon: Birder Robson, MD;  Location: [place];  Service: Ophthalmology;  Laterality: Right;  . ELECTROPHYSIOLOGIC STUDY N/A 10/09/2015   Procedure: Cardioversion;  Surgeon: Isaias Cowman, MD;  Location: [facility];  Service: Cardiovascular;  Laterality: N/A;  . NASAL RECONSTRUCTION WITH SEPTAL  REPAIR    . REPLACEMENT TOTAL KNEE Right   . SHOULDER SURGERY Left   . TONSILLECTOMY      There were no vitals filed for this visit.  Subjective Assessment - 12/28/18 1303    Subjective  Pt reports no pain today and that LBP is improved. Pt reports doing some HEP.  Pt reports feelings of dysequillibrium with standing up quickly.  Pt reports he used quad cane in the past but did not like it.    Pertinent History  Dizziness, Unsteady gait, Hx of falls, Chronic Atrial Fibrillation, Obstructive Sleep Apnea, Diffuse cerebral atrophy, Mild cognitive impairment, Recurrent major depressive disorder, B TKA >6years ago    Limitations  Standing;Walking;House hold activities    Patient Stated Goals  Improve walking/ balance.   Decrease fall risk    Currently in Pain?  No/denies    Pain Onset  In the past 7 days      Therapeutic Exercise: SciFit L6, 10 miin   Neuro: Gait around clinic: 3x L knee extension during stance greater than R with R lateral shift/lean. Gait training with SPC, CGA -   Cuing for technique to decrease UE WB on cane. Pt with 3 instances of LOB. PT with improved technique with can in L UE compared to R but still with one instance of LOB. Gait with dual task (reading numbers, letters, words), SPC, CGA - 4x  Gait with lofstrand crutch L UE - 6x Pt reports greater comfort using Lofstrand and that he feels "less pressure" on his wrist compared to [facility]. No LOB. Step-through pattern, synchronous.  Stepping strategies, posterior, anterior, lateral - 10x each  Cued for bigger steps (delayed response)    Modified CTSIB: Pt with difficulty only with EC on compliant surface. Anterior and posterior LOB, requires UE support and min assist to regain balance.    PT Long Term Goals - 12/20/18 1021      PT LONG TERM GOAL #1   Title  Pt will increase FOTO score to 29 (age norm) to indicate improved functional status.    Baseline  FOTO score 58 12/19/2018    Time  4    Period  Weeks    Status   New    Target Date  01/16/19      PT LONG TERM GOAL #2   Title  Pt wil score >45/56 on the Berg Balance Test to demonstrate decreased in fall risk.    Baseline  Berg score 38/56 12/19/2018    Time  4    Period  Weeks    Status  New    Target Date  01/16/19      PT LONG TERM GOAL #3   Title  Pt will perform 5xSTS in <12 seconds indicating increased LE power.    Baseline  5xSTS in 24 seconds 12/19/2018    Time  4    Period  Weeks    Status  New    Target Date  01/16/19      PT LONG TERM GOAL #4   Title  Pt will score >50 on ABC Scale to indicate increased balance confidence and level of functioning.    Baseline  ABC score 42.5 914/2020    Time  4    Period  Weeks    Status  New    Target Date  01/16/19      PT LONG TERM GOAL #5   Title  Pt. will exhibit a consistent heel strike/ arm swing while ambulating on level surfaces to improve mobility.    Baseline  Limited heel strike/ no arm swing.  Staggering gait pattern.    Time  4    Period  Weeks    Status  New    Target Date  01/16/19            Plan - 12/28/18 1428    Clinical Impression Statement  First part of session focused on gait training and assessment using different AD.  Pt demonstrated greatest dynamic stability with use of lofstrand crutch in L UE.  Pt improved with verbal cueing on 2 point gait mechanics with emphasis on minimal L UE WB through Lofstrand. When pt attempted gait with SPC in R UE, pt increased R UE weightbearing on SPC and lateral lean, resulting in LOB. Pt regained balance with cross-over step strategy and min-assist from SPT. Pt exhibits delayed posterior stepping strategies that are also decreased in step length B. When pt performed posterior stepping strategy exercise pt took multiple, delayed small steps B.  Pt also has delayed lateral stepping strategies.  Pt repeatedly attempted L LE posterior-cross-over step with R lateral stepping and required cuing to correct technique to use R LE for step.   Pt required verbal cueing to lift up Loftstrand higher to clear grassy outdoor surface to prevent LOB.  Pt also required verbal cueing to reduce WB through UE during step up/  down mechanics with Loftstrand crutch on outdoor curb.  Pt perception of balance and safety is impaired; requires cueing to become aware of leaning and often verbally expresses feeling stable despite narrow BOS and swaying to LOS.  Pt will benefit from further skilled therapy to improve gait mechanics with LAD, decrease fall risk, and improve LE strength/ endurance.    Personal Factors and Comorbidities  Age;Comorbidity 1;Comorbidity 2    Examination-Activity Limitations  Stand;Stairs;Squat;Locomotion Level;Bend    Stability/Clinical Decision Making  Evolving/Moderate complexity    Clinical Decision Making  Moderate    Rehab Potential  Fair    PT Frequency  2x / week    PT Duration  4 weeks    PT Treatment/Interventions  Gait training;Stair training;ADLs/Self Care Home Management;Functional mobility training;Therapeutic activities;Therapeutic exercise;Balance training;Neuromuscular re-education;Manual techniques;Passive range of motion;Patient/family education    PT Next Visit Plan  Continue balance training and LE strengthening; Gait on uneven surfaces/outdoors. Assess gait using LAD; UPDATE HEP, uneven surface with AD, step ups    PT Home Exercise Plan  See HEP    Consulted and Agree with Plan of Care  Patient       Patient will benefit from skilled therapeutic intervention in order to improve the following deficits and impairments:  Decreased balance, Abnormal gait, Decreased endurance, Decreased strength, Decreased activity tolerance, Decreased mobility, Difficulty walking, Decreased cognition, Postural dysfunction  Visit Diagnosis: Unsteady gait  Leg weakness, bilateral  Balance problem     Problem List Patient Active Problem List   Diagnosis Date Noted  . OSA (obstructive sleep apnea) 05/03/2018  . Acute on  chronic systolic CHF (congestive heart failure) ([place]) 02/28/2018  . On amiodarone therapy 02/27/2018  . History of depression 02/26/2018  . Status post ablation of atrial fibrillation 02/17/2018  . Atrial fibrillation ([place]) 01/05/2018  . Heart palpitations 10/21/2017  . Diffuse cerebral atrophy ([place]) 03/20/2017  . SOB (shortness of breath) on exertion 03/19/2017  . New onset atrial flutter ([place]) 09/18/2015  . Irregular heart rhythm 09/17/2015  . Absolute anemia 02/18/2015  . Benign fibroma of prostate 02/18/2015  . Clinical depression 02/18/2015  . Abnormal prostate specific antigen 02/18/2015  . Essential (primary) hypertension 02/18/2015  . Anxiety, generalized 02/18/2015  . Insomnia 02/18/2015  . Arthritis, degenerative 02/18/2015  . Peptic ulcer 02/18/2015  . Elevated prostate specific antigen (PSA) 10/19/2013   Pura Spice, PT, DPT # [id number] SPT 12/29/2018, 8:25 AM  [facility] [place] [address] [place], [place], [address] Phone: [phone]   Fax:  [phone]  Name: Jeffrey French MRN: [id number] Date of Birth: 1950-06-14

## 2019-01-02 ENCOUNTER — Encounter: Payer: Self-pay | Admitting: Physical Therapy

## 2019-01-02 ENCOUNTER — Ambulatory Visit: Payer: Medicare Other | Admitting: Physical Therapy

## 2019-01-02 ENCOUNTER — Other Ambulatory Visit: Payer: Self-pay

## 2019-01-02 DIAGNOSIS — R29898 Other symptoms and signs involving the musculoskeletal system: Secondary | ICD-10-CM

## 2019-01-02 DIAGNOSIS — R2689 Other abnormalities of gait and mobility: Secondary | ICD-10-CM | POA: Diagnosis not present

## 2019-01-02 DIAGNOSIS — R2681 Unsteadiness on feet: Secondary | ICD-10-CM

## 2019-01-02 NOTE — Therapy (Signed)
[place] [facility] [address] [place], [place], [address] Phone: [phone]   Fax:  [phone]  Physical Therapy Treatment  Patient Details  Name: Jeffrey French MRN: [id number] Date of Birth: 16-Feb-1951 Referring Provider (PT): Dr. Rosanna Randy   Encounter Date: 01/02/2019  PT End of Session - 01/02/19 0922    Visit Number  5    Number of Visits  8    Date for PT Re-Evaluation  01/16/19    Authorization - Visit Number  5    Authorization - Number of Visits  10    PT Start Time  0814    PT Stop Time  0906    PT Time Calculation (min)  52 min    Equipment Utilized During Treatment  Gait belt    Activity Tolerance  Patient tolerated treatment well    Behavior During Therapy  [facility] for tasks assessed/performed       Past Medical History:  Diagnosis Date  . A-fib ([place])    resolved with ablation  . BPH (benign prostatic hyperplasia)   . GERD (gastroesophageal reflux disease)   . Hypertension   . Numbness of foot    Bilateral. since knee replacements  . Sleep apnea    moderate.  unable to tolerate CPAP    Past Surgical History:  Procedure Laterality Date  . ATRIAL FIBRILLATION ABLATION  02/26/2018   [facility]  . CATARACT EXTRACTION W/PHACO Left 11/08/2018   Procedure: CATARACT EXTRACTION PHACO AND INTRAOCULAR LENS PLACEMENT ([place]) LEFT;  Surgeon: Birder Robson, MD;  Location: [place];  Service: Ophthalmology;  Laterality: Left;  sleep apnea not a Toric per [place]  . CATARACT EXTRACTION W/PHACO Right 11/29/2018   Procedure: CATARACT EXTRACTION PHACO AND INTRAOCULAR LENS PLACEMENT ([facility]) RIGHT  00:50.9  17.4%  8.88;  Surgeon: Birder Robson, MD;  Location: [place];  Service: Ophthalmology;  Laterality: Right;  . ELECTROPHYSIOLOGIC STUDY N/A 10/09/2015   Procedure: Cardioversion;  Surgeon: Isaias Cowman, MD;  Location: [facility];  Service: Cardiovascular;  Laterality: N/A;  . NASAL RECONSTRUCTION WITH SEPTAL  REPAIR    . REPLACEMENT TOTAL KNEE Right   . SHOULDER SURGERY Left   . TONSILLECTOMY      There were no vitals filed for this visit.  Subjective Assessment - 01/02/19 0917    Subjective  Pt reports no pain today.  Pt reports stretching at home.  Pt says he did not use Loftstrand crutch over the weekend, but that his balance feels "better."    Pertinent History  Dizziness, Unsteady gait, Hx of falls, Chronic Atrial Fibrillation, Obstructive Sleep Apnea, Diffuse cerebral atrophy, Mild cognitive impairment, Recurrent major depressive disorder, B TKA >6years ago    Limitations  Standing;Walking;House hold activities    Patient Stated Goals  Improve walking/ balance.   Decrease fall risk    Currently in Pain?  No/denies    Pain Onset  --        Therapeutic Exercise: Scifit L6 x10 min Step ups in // bars with intermittent L UE support - 2x10 B (cueing for upright posture) B heel raises - x20 Standing marching in // bars - 4# 2x20 Standing hip abduction in // bars - 4# 2x20 (cueing for upright posture and limiting hip flexor compensation)   Neuro: Gait in hallway with loftstrand - x3 Gait with head turns in hallway with loftstrand - x3 SLB - 2x8min B (frequent B UE support) Gait outdoors on unlevel surfaces/ curbs with loftstrand/ no AD  Stair tapping with intermittent L UE support - 2x10 B (more difficult on L>R) Standing reaching for cones in // bars: lateral, forward - 2x12 B each     PT Education - 01/02/19 0920    Education Details  Pt educated on curb stepping technique with loftstrand crutch and standing hip abduction technique.    Person(s) Educated  Patient    Methods  Explanation;Demonstration;Verbal cues    Comprehension  Verbalized understanding;Returned demonstration          PT Long Term Goals - 12/20/18 1021      PT LONG TERM GOAL #1   Title  Pt will increase FOTO score to 62 (age norm) to indicate improved functional status.    Baseline  FOTO score 58  12/19/2018    Time  4    Period  Weeks    Status  New    Target Date  01/16/19      PT LONG TERM GOAL #2   Title  Pt wil score >45/56 on the Berg Balance Test to demonstrate decreased in fall risk.    Baseline  Berg score 38/56 12/19/2018    Time  4    Period  Weeks    Status  New    Target Date  01/16/19      PT LONG TERM GOAL #3   Title  Pt will perform 5xSTS in <12 seconds indicating increased LE power.    Baseline  5xSTS in 24 seconds 12/19/2018    Time  4    Period  Weeks    Status  New    Target Date  01/16/19      PT LONG TERM GOAL #4   Title  Pt will score >50 on ABC Scale to indicate increased balance confidence and level of functioning.    Baseline  ABC score 42.5 914/2020    Time  4    Period  Weeks    Status  New    Target Date  01/16/19      PT LONG TERM GOAL #5   Title  Pt. will exhibit a consistent heel strike/ arm swing while ambulating on level surfaces to improve mobility.    Baseline  Limited heel strike/ no arm swing.  Staggering gait pattern.    Time  4    Period  Weeks    Status  New    Target Date  01/16/19            Plan - 01/02/19 1137    Clinical Impression Statement  Pt presents with increased stability and more consistent heel strike during amb with loftstrand > no AD.  Pt demonstrates minimal R arm swing during gait with loftstrand on L; with increased gait deviations and inconsistent step length/ heel strike when cued to incorporate arm swing.  Pt requires cueing for safe technique with loftstrand while stepping up/ down curb outdoors.  Pt requires cueing to increase hip flexion (with and without AD) while walking over grassy surface.  Pt will benefit from further skilled therapy to improve balance, decrease fall risk, and increase LE strength.    Personal Factors and Comorbidities  Age;Comorbidity 1;Comorbidity 2    Examination-Activity Limitations  Stand;Stairs;Squat;Locomotion Level;Bend    Stability/Clinical Decision Making   Evolving/Moderate complexity    Clinical Decision Making  Moderate    Rehab Potential  Fair    PT Frequency  2x / week    PT Duration  4 weeks    PT Treatment/Interventions  Gait training;Stair training;ADLs/Self Care Home Management;Functional mobility training;Therapeutic activities;Therapeutic exercise;Balance training;Neuromuscular re-education;Manual techniques;Passive range of motion;Patient/family education    PT Next Visit Plan  Continue balance training and LE strengthening; Gait on uneven surfaces/outdoors with AD; UPDATE HEP    PT Home Exercise Plan  See HEP    Consulted and Agree with Plan of Care  Patient       Patient will benefit from skilled therapeutic intervention in order to improve the following deficits and impairments:  Decreased balance, Abnormal gait, Decreased endurance, Decreased strength, Decreased activity tolerance, Decreased mobility, Difficulty walking, Decreased cognition, Postural dysfunction  Visit Diagnosis: Unsteady gait  Leg weakness, bilateral  Balance problem     Problem List Patient Active Problem List   Diagnosis Date Noted  . OSA (obstructive sleep apnea) 05/03/2018  . Acute on chronic systolic CHF (congestive heart failure) ([place]) 02/28/2018  . On amiodarone therapy 02/27/2018  . History of depression 02/26/2018  . Status post ablation of atrial fibrillation 02/17/2018  . Atrial fibrillation ([place]) 01/05/2018  . Heart palpitations 10/21/2017  . Diffuse cerebral atrophy ([place]) 03/20/2017  . SOB (shortness of breath) on exertion 03/19/2017  . New onset atrial flutter ([place]) 09/18/2015  . Irregular heart rhythm 09/17/2015  . Absolute anemia 02/18/2015  . Benign fibroma of prostate 02/18/2015  . Clinical depression 02/18/2015  . Abnormal prostate specific antigen 02/18/2015  . Essential (primary) hypertension 02/18/2015  . Anxiety, generalized 02/18/2015  . Insomnia 02/18/2015  . Arthritis, degenerative 02/18/2015  . Peptic ulcer  02/18/2015  . Elevated prostate specific antigen (PSA) 10/19/2013   Pura Spice, PT, DPT # [id number] Chinita Greenland, SPT 01/02/2019, 12:12 PM  [place] [facility] [address] [place], [place], [address] Phone: [phone]   Fax:  [phone]  Name: Jeffrey French MRN: [id number] Date of Birth: [birth year]

## 2019-01-04 ENCOUNTER — Encounter: Payer: Self-pay | Admitting: Physical Therapy

## 2019-01-04 ENCOUNTER — Other Ambulatory Visit: Payer: Self-pay

## 2019-01-04 ENCOUNTER — Ambulatory Visit: Payer: Medicare Other | Admitting: Physical Therapy

## 2019-01-04 DIAGNOSIS — R2681 Unsteadiness on feet: Secondary | ICD-10-CM | POA: Diagnosis not present

## 2019-01-04 DIAGNOSIS — R29898 Other symptoms and signs involving the musculoskeletal system: Secondary | ICD-10-CM

## 2019-01-04 DIAGNOSIS — R2689 Other abnormalities of gait and mobility: Secondary | ICD-10-CM | POA: Diagnosis not present

## 2019-01-04 NOTE — Therapy (Signed)
[place] [facility] [address] [place], [place], [address] Phone: [phone]   Fax:  [phone]  Physical Therapy Treatment  Patient Details  Name: Jeffrey French MRN: [id number] Date of Birth: June 03, 1950 Referring Provider (PT): Dr. Rosanna Randy   Encounter Date: 01/04/2019  PT End of Session - 01/04/19 1303    Visit Number  6    Number of Visits  8    Date for PT Re-Evaluation  01/16/19    Authorization - Visit Number  6    Authorization - Number of Visits  10    PT Start Time  [id number]    PT Stop Time  1404    PT Time Calculation (min)  68 min    Equipment Utilized During Treatment  Gait belt    Activity Tolerance  Patient tolerated treatment well    Behavior During Therapy  [facility] for tasks assessed/performed       Past Medical History:  Diagnosis Date  . A-fib ([place])    resolved with ablation  . BPH (benign prostatic hyperplasia)   . GERD (gastroesophageal reflux disease)   . Hypertension   . Numbness of foot    Bilateral. since knee replacements  . Sleep apnea    moderate.  unable to tolerate CPAP    Past Surgical History:  Procedure Laterality Date  . ATRIAL FIBRILLATION ABLATION  02/26/2018   [facility]  . CATARACT EXTRACTION W/PHACO Left 11/08/2018   Procedure: CATARACT EXTRACTION PHACO AND INTRAOCULAR LENS PLACEMENT ([place]) LEFT;  Surgeon: Birder Robson, MD;  Location: [place];  Service: Ophthalmology;  Laterality: Left;  sleep apnea not a Toric per [place]  . CATARACT EXTRACTION W/PHACO Right 11/29/2018   Procedure: CATARACT EXTRACTION PHACO AND INTRAOCULAR LENS PLACEMENT ([facility]) RIGHT  00:50.9  17.4%  8.88;  Surgeon: Birder Robson, MD;  Location: [place];  Service: Ophthalmology;  Laterality: Right;  . ELECTROPHYSIOLOGIC STUDY N/A 10/09/2015   Procedure: Cardioversion;  Surgeon: Isaias Cowman, MD;  Location: [facility];  Service: Cardiovascular;  Laterality: N/A;  . NASAL RECONSTRUCTION WITH SEPTAL  REPAIR    . REPLACEMENT TOTAL KNEE Right   . SHOULDER SURGERY Left   . TONSILLECTOMY      There were no vitals filed for this visit.  Subjective Assessment - 01/04/19 1302    Subjective  Pt. entered PT without use of Loftstrand and did a quick turning resulting in a LOB into wall (self-corrected).  Pt. walked back out to car to get Loftstand crutch to focus on balance/ gait activities.    Pertinent History  Dizziness, Unsteady gait, Hx of falls, Chronic Atrial Fibrillation, Obstructive Sleep Apnea, Diffuse cerebral atrophy, Mild cognitive impairment, Recurrent major depressive disorder, B TKA >6years ago    Limitations  Standing;Walking;House hold activities    Patient Stated Goals  Improve walking/ balance.   Decrease fall risk    Currently in Pain?  No/denies          Therapeutic Exercise:  Scifit L6 x10 min. Seated/standing there.ex. program: marching/ LAQ/ hip abduction/ heel raises/ hip extension with 5# (2x15 each).  Walking hip flexion (forward/backwards) 5x in //-bars with 5# (cuing to increase upright posture/ head position with mirror feedback).  Lateral walking in //-bars with 5# ankle wts. 5x.   Step ups/overs in //-bars (5# ankle wts.) with intermittent L UE support - 2x15 B (cueing for upright posture) See HEP (handouts provided)   Neuro:  Gait outdoors on unlevel surfaces/ curbs with loftstrand/ no  AD (around building).   Stair tapping with intermittent light to no UE support - 2x10 B (with 5# ankle wts.) Gait in hallway with loftstrand - x3 Discuss balance with household tasks/ working on truck.      PT Long Term Goals - 12/20/18 1021      PT LONG TERM GOAL #1   Title  Pt will increase FOTO score to 35 (age norm) to indicate improved functional status.    Baseline  FOTO score 58 12/19/2018    Time  4    Period  Weeks    Status  New    Target Date  01/16/19      PT LONG TERM GOAL #2   Title  Pt wil score >45/56 on the Berg Balance Test to demonstrate  decreased in fall risk.    Baseline  Berg score 38/56 12/19/2018    Time  4    Period  Weeks    Status  New    Target Date  01/16/19      PT LONG TERM GOAL #3   Title  Pt will perform 5xSTS in <12 seconds indicating increased LE power.    Baseline  5xSTS in 24 seconds 12/19/2018    Time  4    Period  Weeks    Status  New    Target Date  01/16/19      PT LONG TERM GOAL #4   Title  Pt will score >50 on ABC Scale to indicate increased balance confidence and level of functioning.    Baseline  ABC score 42.5 914/2020    Time  4    Period  Weeks    Status  New    Target Date  01/16/19      PT LONG TERM GOAL #5   Title  Pt. will exhibit a consistent heel strike/ arm swing while ambulating on level surfaces to improve mobility.    Baseline  Limited heel strike/ no arm swing.  Staggering gait pattern.    Time  4    Period  Weeks    Status  New    Target Date  01/16/19            Plan - 01/04/19 1304    Clinical Impression Statement  Pt. had 1 LOB upon entering PT gym while turning quickly to look over shoulder and able to self-correct with assist of wall/ UE.  Pt. benefits from continued use of single Loftstand crutch with 2-point gait pattern on level/ outside surfaces.  Pt. requires moderate cuing t/o treatment to correct posture/ head position and increase BOS during gait/ dynamic balance tasks.  Issued new HEP (handouts provided)    Personal Factors and Comorbidities  Age;Comorbidity 1;Comorbidity 2    Examination-Activity Limitations  Stand;Stairs;Squat;Locomotion Level;Bend    Stability/Clinical Decision Making  Evolving/Moderate complexity    Clinical Decision Making  Moderate    Rehab Potential  Fair    PT Frequency  2x / week    PT Duration  4 weeks    PT Treatment/Interventions  Gait training;Stair training;ADLs/Self Care Home Management;Functional mobility training;Therapeutic activities;Therapeutic exercise;Balance training;Neuromuscular re-education;Manual  techniques;Passive range of motion;Patient/family education    PT Next Visit Plan  Continue balance training and LE strengthening; Gait on uneven surfaces/outdoors with AD    PT Home Exercise Plan  See HEP    Consulted and Agree with Plan of Care  Patient       Patient will benefit from skilled therapeutic intervention in order  to improve the following deficits and impairments:  Decreased balance, Abnormal gait, Decreased endurance, Decreased strength, Decreased activity tolerance, Decreased mobility, Difficulty walking, Decreased cognition, Postural dysfunction  Visit Diagnosis: Unsteady gait  Leg weakness, bilateral  Balance problem     Problem List Patient Active Problem List   Diagnosis Date Noted  . OSA (obstructive sleep apnea) 05/03/2018  . Acute on chronic systolic CHF (congestive heart failure) ([place]) 02/28/2018  . On amiodarone therapy 02/27/2018  . History of depression 02/26/2018  . Status post ablation of atrial fibrillation 02/17/2018  . Atrial fibrillation ([place]) 01/05/2018  . Heart palpitations 10/21/2017  . Diffuse cerebral atrophy ([place]) 03/20/2017  . SOB (shortness of breath) on exertion 03/19/2017  . New onset atrial flutter ([place]) 09/18/2015  . Irregular heart rhythm 09/17/2015  . Absolute anemia 02/18/2015  . Benign fibroma of prostate 02/18/2015  . Clinical depression 02/18/2015  . Abnormal prostate specific antigen 02/18/2015  . Essential (primary) hypertension 02/18/2015  . Anxiety, generalized 02/18/2015  . Insomnia 02/18/2015  . Arthritis, degenerative 02/18/2015  . Peptic ulcer 02/18/2015  . Elevated prostate specific antigen (PSA) 10/19/2013   Pura Spice, PT, DPT # [phone] 01/04/2019, 2:11 PM  [place] [facility] [address] [place], [place], [address] Phone: [phone]   Fax:  [phone]  Name: LAVI NITKA MRN: [id number] Date of Birth: 06-23-1950

## 2019-01-04 NOTE — Patient Instructions (Signed)
Access Code: [email or web address]  URL: [email or web address]  Date: 01/04/2019  Prepared by: Dorcas Carrow   Exercises  Standing Single Leg Stance with Counter Support - 2 reps - 1 sets - 30 hold - 1x daily - 7x weekly  Seated March - 20 reps - 1 sets - 2x daily - 4x weekly  Seated Long Arc Quad - 20 reps - 1 sets - 2x daily - 4x weekly  Standing March with Unilateral Counter Support - 20 reps - 1 sets - 2x daily - 4x weekly  Heel rises with counter support - 20 reps - 1 sets - 2x daily - 4x weekly  Standing Hip Abduction with Counter Support - 20 reps - 1 sets - 2x daily - 4x weekly  Sit to Stand without Arm Support - 10 reps - 1 sets - 2x daily - 4x weekly

## 2019-01-09 ENCOUNTER — Encounter: Payer: Medicare Other | Admitting: Physical Therapy

## 2019-01-11 ENCOUNTER — Ambulatory Visit: Payer: Medicare Other | Attending: Family Medicine | Admitting: Physical Therapy

## 2019-01-11 ENCOUNTER — Other Ambulatory Visit: Payer: Self-pay

## 2019-01-11 DIAGNOSIS — R29898 Other symptoms and signs involving the musculoskeletal system: Secondary | ICD-10-CM | POA: Insufficient documentation

## 2019-01-11 DIAGNOSIS — R2689 Other abnormalities of gait and mobility: Secondary | ICD-10-CM | POA: Diagnosis not present

## 2019-01-11 DIAGNOSIS — R2681 Unsteadiness on feet: Secondary | ICD-10-CM | POA: Diagnosis not present

## 2019-01-11 NOTE — Therapy (Signed)
[facility] [address] [place], [place], [address] Phone: [phone]   Fax:  [phone]  Physical Therapy Treatment  Patient Details  Name: Jeffrey French MRN: [id number] Date of Birth: [birth year] Referring Provider (PT): Dr. Rosanna Randy   Encounter Date: 01/11/2019  PT End of Session - 01/11/19 1246    Visit Number  7    Number of Visits  8    Date for PT Re-Evaluation  01/16/19    Authorization - Visit Number  7    Authorization - Number of Visits  10    PT Start Time  1112    PT Stop Time  1200    PT Time Calculation (min)  48 min    Equipment Utilized During Treatment  Gait belt    Activity Tolerance  Patient tolerated treatment well    Behavior During Therapy  [facility] for tasks assessed/performed       Past Medical History:  Diagnosis Date  . A-fib ([place])    resolved with ablation  . BPH (benign prostatic hyperplasia)   . GERD (gastroesophageal reflux disease)   . Hypertension   . Numbness of foot    Bilateral. since knee replacements  . Sleep apnea    moderate.  unable to tolerate CPAP    Past Surgical History:  Procedure Laterality Date  . ATRIAL FIBRILLATION ABLATION  02/26/2018   [facility]  . CATARACT EXTRACTION W/PHACO Left 11/08/2018   Procedure: CATARACT EXTRACTION PHACO AND INTRAOCULAR LENS PLACEMENT ([place]) LEFT;  Surgeon: Birder Robson, MD;  Location: [place];  Service: Ophthalmology;  Laterality: Left;  sleep apnea not a Toric per [place]  . CATARACT EXTRACTION W/PHACO Right 11/29/2018   Procedure: CATARACT EXTRACTION PHACO AND INTRAOCULAR LENS PLACEMENT ([facility]) RIGHT  00:50.9  17.4%  8.88;  Surgeon: Birder Robson, MD;  Location: [place];  Service: Ophthalmology;  Laterality: Right;  . ELECTROPHYSIOLOGIC STUDY N/A 10/09/2015   Procedure: Cardioversion;  Surgeon: Isaias Cowman, MD;  Location: [facility];  Service: Cardiovascular;  Laterality: N/A;  . NASAL RECONSTRUCTION WITH SEPTAL  REPAIR    . REPLACEMENT TOTAL KNEE Right   . SHOULDER SURGERY Left   . TONSILLECTOMY      There were no vitals filed for this visit.  Subjective Assessment - 01/11/19 1307    Subjective  Pt entered using loftstrand.  Pt reports 2/10 LBP today and that HEP hip adbuction and extension exacerbated LBP sx.    Pertinent History  Dizziness, Unsteady gait, Hx of falls, Chronic Atrial Fibrillation, Obstructive Sleep Apnea, Diffuse cerebral atrophy, Mild cognitive impairment, Recurrent major depressive disorder, B TKA >6years ago    Limitations  Standing;Walking;House hold activities    Patient Stated Goals  Improve walking/ balance.   Decrease fall risk    Currently in Pain?  Yes    Pain Score  2     Pain Location  Back    Pain Orientation  Lower       Pt educated on standing hip abduction/ extension technique without weights for performance of HEP without exacerbating LBP.   Therapeutic Exercise: Scifit L6.5 x10 min Standing hip abduction 4# ankle weights - 2x10 B (cuing for upright posture and performing within decreased range to reduce lumbar lateral flexion) Standing hip extension 4# ankle weights - 2x10 B  (cuing for upright posture and performing within decreased range to reduce lumbar flexion) Standing marching with unilateral UE support 4# ankle weights - 2x20 B Standing hamstring curls with  4# ankle weights - 2x20 B    Neuro: Gait in hallway (loftstrand) - x4 Gait in hallway with head turns (loftstrand) - x4 (inconsistent gait deviations bilaterally; cuing for midline walking)  Gait outdoor surfaces: concrete, grass, curbs, stairs with railing (cueing for increased hip flexion and loftstrand clearance in grass; cueing for curb/ stair technique with loftstrand) - pt demonstrates multiple LOB with quick turns and able to self-correct with use of loftstrand and stepping strategies; pt unable to maintain midline walking with head turns; pt unable to place whole foot on stair when  descending due to large feet but uses railing and is stable) Obstacle course with step up onto foam, cone weaving, and stepping over 6" plinthe (loftstrand, CGA) Obstacle course with alternating cone tapping and 6" plinth stair tapping on foam (min-mod assist) - LOB x4 with cone tapping, corrects with stepping strategy and PT assistance    PT Long Term Goals - 12/20/18 1021      PT LONG TERM GOAL #1   Title  Pt will increase FOTO score to 27 (age norm) to indicate improved functional status.    Baseline  FOTO score 58 12/19/2018    Time  4    Period  Weeks    Status  New    Target Date  01/16/19      PT LONG TERM GOAL #2   Title  Pt wil score >45/56 on the Berg Balance Test to demonstrate decreased in fall risk.    Baseline  Berg score 38/56 12/19/2018    Time  4    Period  Weeks    Status  New    Target Date  01/16/19      PT LONG TERM GOAL #3   Title  Pt will perform 5xSTS in <12 seconds indicating increased LE power.    Baseline  5xSTS in 24 seconds 12/19/2018    Time  4    Period  Weeks    Status  New    Target Date  01/16/19      PT LONG TERM GOAL #4   Title  Pt will score >50 on ABC Scale to indicate increased balance confidence and level of functioning.    Baseline  ABC score 42.5 914/2020    Time  4    Period  Weeks    Status  New    Target Date  01/16/19      PT LONG TERM GOAL #5   Title  Pt. will exhibit a consistent heel strike/ arm swing while ambulating on level surfaces to improve mobility.    Baseline  Limited heel strike/ no arm swing.  Staggering gait pattern.    Time  4    Period  Weeks    Status  New    Target Date  01/16/19            Plan - 01/11/19 1309    Clinical Impression Statement  Pt educated on standing hip abduction/ extension technique without weights for performance of HEP without exacerbating LBP; pt demonstrates correct form with cueing for upright posture and reduced lumbar flexion/ lateral flexion and reports no LBP with proper  technique.  Pt demonstrates inconsistent gait deviations bilaterally with head turns indoors and outdoors.  Pt intermittently leans forward and shakes his head in sitting, standing, and during gait resulting in gait deviations and LOB corrected with stepping strategy and Loftstrand.  Pt requires CGA with outdoor walking; pt has multiple LOB with impulsive quick turns  but is able to self-correct with stepping strategies and use of Loftstrand.  When descending stairs, pt unable to place whole foot on stair but uses railing and is stable.  Pt requires CGA to min assist with obstacle course activities with multiple LOB with cone/ stair tapping; able to correct with stepping strategy and PT assistance.  Pt will benefit from further skilled therapy to improve balance/ gait mechanics, reduce fall risk, and increase LE strength.    Personal Factors and Comorbidities  Age;Comorbidity 1;Comorbidity 2    Examination-Activity Limitations  Stand;Stairs;Squat;Locomotion Level;Bend    Stability/Clinical Decision Making  Evolving/Moderate complexity    Clinical Decision Making  Moderate    Rehab Potential  Fair    PT Frequency  2x / week    PT Duration  4 weeks    PT Treatment/Interventions  Gait training;Stair training;ADLs/Self Care Home Management;Functional mobility training;Therapeutic activities;Therapeutic exercise;Balance training;Neuromuscular re-education;Manual techniques;Passive range of motion;Patient/family education    PT Next Visit Plan  Continue balance training and LE strengthening; Gait on uneven surfaces/outdoors with AD. Obstacle couse/ foam    PT Home Exercise Plan  [phone]    Consulted and Agree with Plan of Care  Patient       Patient will benefit from skilled therapeutic intervention in order to improve the following deficits and impairments:  Decreased balance, Abnormal gait, Decreased endurance, Decreased strength, Decreased activity tolerance, Decreased mobility, Difficulty walking,  Decreased cognition, Postural dysfunction  Visit Diagnosis: Unsteady gait  Leg weakness, bilateral  Balance problem     Problem List Patient Active Problem List   Diagnosis Date Noted  . OSA (obstructive sleep apnea) 05/03/2018  . Acute on chronic systolic CHF (congestive heart failure) ([place]) 02/28/2018  . On amiodarone therapy 02/27/2018  . History of depression 02/26/2018  . Status post ablation of atrial fibrillation 02/17/2018  . Atrial fibrillation ([place]) 01/05/2018  . Heart palpitations 10/21/2017  . Diffuse cerebral atrophy ([place]) 03/20/2017  . SOB (shortness of breath) on exertion 03/19/2017  . New onset atrial flutter ([place]) 09/18/2015  . Irregular heart rhythm 09/17/2015  . Absolute anemia 02/18/2015  . Benign fibroma of prostate 02/18/2015  . Clinical depression 02/18/2015  . Abnormal prostate specific antigen 02/18/2015  . Essential (primary) hypertension 02/18/2015  . Anxiety, generalized 02/18/2015  . Insomnia 02/18/2015  . Arthritis, degenerative 02/18/2015  . Peptic ulcer 02/18/2015  . Elevated prostate specific antigen (PSA) 10/19/2013   Pura Spice, PT, DPT # [id number] Chinita Greenland, SPT 01/11/2019, 1:20 PM  [facility] [address] [place], [place], [address] Phone: [phone]   Fax:  [phone]  Name: ARRICK PISKOR MRN: [id number] Date of Birth: [birth year]

## 2019-01-16 ENCOUNTER — Encounter: Payer: Self-pay | Admitting: Physical Therapy

## 2019-01-16 ENCOUNTER — Ambulatory Visit: Payer: Medicare Other | Admitting: Physical Therapy

## 2019-01-16 ENCOUNTER — Other Ambulatory Visit: Payer: Self-pay

## 2019-01-16 DIAGNOSIS — R29898 Other symptoms and signs involving the musculoskeletal system: Secondary | ICD-10-CM | POA: Diagnosis not present

## 2019-01-16 DIAGNOSIS — R2689 Other abnormalities of gait and mobility: Secondary | ICD-10-CM

## 2019-01-16 DIAGNOSIS — R2681 Unsteadiness on feet: Secondary | ICD-10-CM

## 2019-01-16 NOTE — Therapy (Addendum)
[facility] [address] [place], [place], [address] Phone: [phone]   Fax:  [phone]  Physical Therapy Treatment  Patient Details  Name: Jeffrey French MRN: [id number] Date of Birth: [birth year] Referring Provider (PT): Dr. Rosanna Randy   Encounter Date: 01/16/2019  PT End of Session - 01/16/19 1440    Visit Number  8    Number of Visits  8    Date for PT Re-Evaluation  01/16/19    Authorization - Visit Number  8    Authorization - Number of Visits  10    PT Start Time  0814    PT Stop Time  0905    PT Time Calculation (min)  51 min    Equipment Utilized During Treatment  Gait belt    Activity Tolerance  Patient tolerated treatment well    Behavior During Therapy  [facility] for tasks assessed/performed       Past Medical History:  Diagnosis Date  . A-fib ([place])    resolved with ablation  . BPH (benign prostatic hyperplasia)   . GERD (gastroesophageal reflux disease)   . Hypertension   . Numbness of foot    Bilateral. since knee replacements  . Sleep apnea    moderate.  unable to tolerate CPAP    Past Surgical History:  Procedure Laterality Date  . ATRIAL FIBRILLATION ABLATION  02/26/2018   [facility]  . CATARACT EXTRACTION W/PHACO Left 11/08/2018   Procedure: CATARACT EXTRACTION PHACO AND INTRAOCULAR LENS PLACEMENT ([place]) LEFT;  Surgeon: Birder Robson, MD;  Location: [place];  Service: Ophthalmology;  Laterality: Left;  sleep apnea not a Toric per [facility]  . CATARACT EXTRACTION W/PHACO Right 11/29/2018   Procedure: CATARACT EXTRACTION PHACO AND INTRAOCULAR LENS PLACEMENT ([facility]) RIGHT  00:50.9  17.4%  8.88;  Surgeon: Birder Robson, MD;  Location: [place];  Service: Ophthalmology;  Laterality: Right;  . ELECTROPHYSIOLOGIC STUDY N/A 10/09/2015   Procedure: Cardioversion;  Surgeon: Isaias Cowman, MD;  Location: [facility];  Service: Cardiovascular;  Laterality: N/A;  . NASAL RECONSTRUCTION WITH SEPTAL  REPAIR    . REPLACEMENT TOTAL KNEE Right   . SHOULDER SURGERY Left   . TONSILLECTOMY      There were no vitals filed for this visit.  Subjective Assessment - 01/16/19 0817    Subjective  Pt entered using loftstrand.  Pt reports 2/10 LBP today. PT reports doing HEP with no exacerbated LBP sx.    Pertinent History  Dizziness, Unsteady gait, Hx of falls, Chronic Atrial Fibrillation, Obstructive Sleep Apnea, Diffuse cerebral atrophy, Mild cognitive impairment, Recurrent major depressive disorder, B TKA >6years ago    Limitations  Standing;Walking;House hold activities    Patient Stated Goals  Improve walking/ balance.   Decrease fall risk    Currently in Pain?  Yes    Pain Score  2     Pain Location  Back    Pain Orientation  Lower       Goal Reassessment:  MMT (out of 5) R/L 4+/4 Hip flexion 5/5 Hip abduction 5/5 Hip adduction 5/5 Knee flexion 5/5 Knee extension 5/5 Dorsiflexion 5/5 Plantarflexion  FOTO: 71 (20 age norm) Berg: 45/56 5xSTS: 14s ABC: 85%  Gait with Loftstrand: increased heel strike consistency with Loftstrand in L UE, limited R arm swing, intermittent gait deviations (especially with head turns)   Gait speed: With Loftstrand: 1.05 m/s Without 1.09 m/s    Treatment:  Therapeutic Ex: Standing B heel raises - 1x10 Standing  single leg heel raises - 2x10 B Scifit L6.5 x 10 min   PT Education - 01/16/19 1439    Education Details  Pt educated on gait (turning/ head turns) with loftstrand    Person(s) Educated  Patient    Methods  Explanation;Demonstration;Verbal cues    Comprehension  Verbalized understanding;Returned demonstration;Need further instruction          PT Long Term Goals - 01/16/19 1442      PT LONG TERM GOAL #1   Title  Pt will increase FOTO score to 100 (age norm) to indicate improved functional status.    Baseline  FOTO score 58 12/19/2018; FOTO score 66 01/16/2019    Time  4    Period  Weeks    Status  Partially Met    Target  Date  02/13/19      PT LONG TERM GOAL #2   Title  Pt wil score >45/56 on the Berg Balance Test to demonstrate decreased in fall risk.    Baseline  Berg score 38/56 12/19/2018; Berg score 45/56 01/16/2019    Time  4    Period  Weeks    Status  Partially Met    Target Date  02/13/19      PT LONG TERM GOAL #3   Title  Pt will perform 5xSTS in <12 seconds indicating increased LE power.    Baseline  5xSTS in 24 seconds 12/19/2018; 5xSTS in 14s 01/16/2019    Time  4    Period  Weeks    Status  On-going    Target Date  02/13/19      PT LONG TERM GOAL #4   Title  Pt will score >50 on ABC Scale to indicate increased balance confidence and level of functioning.    Baseline  ABC score 42.5 914/2020; ABC score 85% 01/16/2019    Time  4    Period  Weeks    Status  Achieved    Target Date  01/16/19      PT LONG TERM GOAL #5   Title  Pt. will exhibit a consistent heel strike/ arm swing while ambulating with Loftstrand on level surfaces to improve mobility.    Baseline  Limited heel strike/ no arm swing.  Staggering gait pattern 12/19/2018; increased heel strike consistency with Loftstrand, limited R arm swing, intermittent gait deviations (especially with head turns) 20/03/2019    Time  4    Period  Weeks    Status  On-going    Target Date  02/13/19      Additional Long Term Goals   Additional Long Term Goals  Yes      PT LONG TERM GOAL #6   Title  Pt will demonstrate gait speed with Loftstrand greater than or equal to gait speed without AD (1.09 m/s) to increase perceived mobility with AD.    Baseline  Lofstrand gait speed 1.05 m/s 01/16/2019    Time  4    Period  Weeks    Status  New    Target Date  02/13/19         Plan - 01/16/19 1442    Clinical Impression Statement  Pt goals were reassessed today; pt demonstrates progress on all goals indicating a decrease in fall risk and increased LE power.  Pt achieved his balance confidence goal (ABC score: 85%).  Pt LE MMT reveals a  half-grade increase in each previously measured deficit (hip flexion R:4+/5, L: 4/5, L knee extension 5/5).  Pt  gait with Loftstrand reveals increased heel strike consistency, limited R arm swing, and intermittent gait deviations (especially with head turns).  Pt unable to make quick turns safely without LOB, but is able to perform slow turns safely.  Pt reports feeling "slower" with Loftstrand than amb without AD which prevents him from using it as much outside of the clinic (gait speed with AD: 1.05 m/s; without: 1.09 m/s).  Pt will benefit from further skilled therapy to futher decrease fall risk, improve gait mechanics with Loftstrand, and increase LE strength/ endurance.    Personal Factors and Comorbidities  Age;Comorbidity 1;Comorbidity 2    Examination-Activity Limitations  Stand;Stairs;Squat;Locomotion Level;Bend    Stability/Clinical Decision Making  Evolving/Moderate complexity    Clinical Decision Making  Moderate    Rehab Potential  Fair    PT Frequency  2x / week    PT Duration  4 weeks    PT Treatment/Interventions  Gait training;Stair training;ADLs/Self Care Home Management;Functional mobility training;Therapeutic activities;Therapeutic exercise;Balance training;Neuromuscular re-education;Manual techniques;Passive range of motion;Patient/family education    PT Next Visit Plan  Gait with Loftstrand, progress static/ dynamic balance exercises, progress HEP    PT Home Exercise Plan  [phone]    Consulted and Agree with Plan of Care  Patient       Patient will benefit from skilled therapeutic intervention in order to improve the following deficits and impairments:  Decreased balance, Abnormal gait, Decreased endurance, Decreased strength, Decreased activity tolerance, Decreased mobility, Difficulty walking, Decreased cognition, Postural dysfunction  Visit Diagnosis: Unsteady gait  Leg weakness, bilateral  Balance problem     Problem List Patient Active Problem List    Diagnosis Date Noted  . OSA (obstructive sleep apnea) 05/03/2018  . Acute on chronic systolic CHF (congestive heart failure) ([place]) 02/28/2018  . On amiodarone therapy 02/27/2018  . History of depression 02/26/2018  . Status post ablation of atrial fibrillation 02/17/2018  . Atrial fibrillation ([place]) 01/05/2018  . Heart palpitations 10/21/2017  . Diffuse cerebral atrophy ([place]) 03/20/2017  . SOB (shortness of breath) on exertion 03/19/2017  . New onset atrial flutter ([place]) 09/18/2015  . Irregular heart rhythm 09/17/2015  . Absolute anemia 02/18/2015  . Benign fibroma of prostate 02/18/2015  . Clinical depression 02/18/2015  . Abnormal prostate specific antigen 02/18/2015  . Essential (primary) hypertension 02/18/2015  . Anxiety, generalized 02/18/2015  . Insomnia 02/18/2015  . Arthritis, degenerative 02/18/2015  . Peptic ulcer 02/18/2015  . Elevated prostate specific antigen (PSA) 10/19/2013   Pura Spice, PT, DPT # [id number] Chinita Greenland, SPT 01/16/2019, 3:36 PM  [facility] [address] [place], [place], [address] Phone: [phone]   Fax:  [phone]  Name: MICHAELANGELO MITTELMAN MRN: [id number] Date of Birth: 1950-05-08

## 2019-01-18 ENCOUNTER — Ambulatory Visit: Payer: Medicare Other | Admitting: Physical Therapy

## 2019-01-18 ENCOUNTER — Encounter: Payer: Self-pay | Admitting: Family Medicine

## 2019-01-18 ENCOUNTER — Other Ambulatory Visit: Payer: Self-pay

## 2019-01-18 ENCOUNTER — Encounter: Payer: Self-pay | Admitting: Physical Therapy

## 2019-01-18 ENCOUNTER — Ambulatory Visit (INDEPENDENT_AMBULATORY_CARE_PROVIDER_SITE_OTHER): Payer: Medicare Other | Admitting: Family Medicine

## 2019-01-18 VITALS — BP 132/72 | HR 84 | Temp 98.5°F | Resp 16 | Ht 75.0 in | Wt 227.0 lb

## 2019-01-18 DIAGNOSIS — I10 Essential (primary) hypertension: Secondary | ICD-10-CM

## 2019-01-18 DIAGNOSIS — Z23 Encounter for immunization: Secondary | ICD-10-CM

## 2019-01-18 DIAGNOSIS — G4733 Obstructive sleep apnea (adult) (pediatric): Secondary | ICD-10-CM | POA: Diagnosis not present

## 2019-01-18 DIAGNOSIS — R2681 Unsteadiness on feet: Secondary | ICD-10-CM

## 2019-01-18 DIAGNOSIS — I482 Chronic atrial fibrillation, unspecified: Secondary | ICD-10-CM | POA: Diagnosis not present

## 2019-01-18 DIAGNOSIS — R2689 Other abnormalities of gait and mobility: Secondary | ICD-10-CM | POA: Diagnosis not present

## 2019-01-18 DIAGNOSIS — F3341 Major depressive disorder, recurrent, in partial remission: Secondary | ICD-10-CM

## 2019-01-18 DIAGNOSIS — G319 Degenerative disease of nervous system, unspecified: Secondary | ICD-10-CM

## 2019-01-18 DIAGNOSIS — R29898 Other symptoms and signs involving the musculoskeletal system: Secondary | ICD-10-CM

## 2019-01-18 NOTE — Therapy (Signed)
[facility] [address] [place], [place], [address] Phone: [phone]   Fax:  [phone]  Physical Therapy Treatment  Patient Details  Name: Jeffrey French MRN: [id number] Date of Birth: Sep 24, 1950 Referring Provider (PT): Dr. Rosanna Randy   Encounter Date: 01/18/2019  PT End of Session - 01/18/19 1301    Visit Number  9    Number of Visits  16    Date for PT Re-Evaluation  02/13/19    Authorization - Visit Number  1    Authorization - Number of Visits  10    PT Start Time  1115    PT Stop Time  1201    PT Time Calculation (min)  46 min    Equipment Utilized During Treatment  Gait belt    Activity Tolerance  Patient tolerated treatment well    Behavior During Therapy  [facility] for tasks assessed/performed       Past Medical History:  Diagnosis Date  . A-fib ([place])    resolved with ablation  . BPH (benign prostatic hyperplasia)   . GERD (gastroesophageal reflux disease)   . Hypertension   . Numbness of foot    Bilateral. since knee replacements  . Sleep apnea    moderate.  unable to tolerate CPAP    Past Surgical History:  Procedure Laterality Date  . ATRIAL FIBRILLATION ABLATION  02/26/2018   [facility]  . CATARACT EXTRACTION W/PHACO Left 11/08/2018   Procedure: CATARACT EXTRACTION PHACO AND INTRAOCULAR LENS PLACEMENT ([place]) LEFT;  Surgeon: Birder Robson, MD;  Location: [place];  Service: Ophthalmology;  Laterality: Left;  sleep apnea not a Toric per [place]  . CATARACT EXTRACTION W/PHACO Right 11/29/2018   Procedure: CATARACT EXTRACTION PHACO AND INTRAOCULAR LENS PLACEMENT ([facility]) RIGHT  00:50.9  17.4%  8.88;  Surgeon: Birder Robson, MD;  Location: [place];  Service: Ophthalmology;  Laterality: Right;  . ELECTROPHYSIOLOGIC STUDY N/A 10/09/2015   Procedure: Cardioversion;  Surgeon: Isaias Cowman, MD;  Location: [facility];  Service: Cardiovascular;  Laterality: N/A;  . NASAL RECONSTRUCTION WITH SEPTAL  REPAIR    . REPLACEMENT TOTAL KNEE Right   . SHOULDER SURGERY Left   . TONSILLECTOMY      There were no vitals filed for this visit.  Subjective Assessment - 01/18/19 1232    Subjective  Pt entered using loftstrand.  Pt reports 1/10 LBP today. PT reports doing HEP with no exacerbated LBP sx, but has difficulty with SLB with counter support.    Pertinent History  Dizziness, Unsteady gait, Hx of falls, Chronic Atrial Fibrillation, Obstructive Sleep Apnea, Diffuse cerebral atrophy, Mild cognitive impairment, Recurrent major depressive disorder, B TKA >6years ago    Limitations  Standing;Walking;House hold activities    Patient Stated Goals  Improve walking/ balance.   Decrease fall risk    Currently in Pain?  Yes    Pain Score  1     Pain Location  Back    Pain Orientation  Lower        Therapeutic Exercise: Scifit L6.5 x10 min  Marching forward/ backward in // bars with 5# ankle weights - x4 with cueing to maintain proper BOS   Lateral walking in // bars with 5# ankle weights - x4    Neuro: Walking forward/ backward in // bars with lateral reaching - x4 cueing for posture, consistent heel strike, and hip flexion for sufficient foot clearance  Lateral walking in // bars with forward reaching - x4  Obstacle  course over uneven compliant surface, step up/ over airex, step up onto airex for SLB alternating cone tapping on foam - x4 with cueing for proper foot clearance for foot clearance over objects; cueing for slow and controlled movements with SLB  Obstacle course with cone weaving over uneven compliant surface, step up onto airex to perform SLB with various cone tapping on 6" plinth  Gait on outdoor surfaces, grass, curbs      PT Long Term Goals - 01/16/19 1442      PT LONG TERM GOAL #1   Title  Pt will increase FOTO score to 9 (age norm) to indicate improved functional status.    Baseline  FOTO score 58 12/19/2018; FOTO score 66 01/16/2019    Time  4    Period  Weeks     Status  Partially Met    Target Date  02/13/19      PT LONG TERM GOAL #2   Title  Pt wil score >45/56 on the Berg Balance Test to demonstrate decreased in fall risk.    Baseline  Berg score 38/56 12/19/2018; Berg score 45/56 01/16/2019    Time  4    Period  Weeks    Status  Partially Met    Target Date  02/13/19      PT LONG TERM GOAL #3   Title  Pt will perform 5xSTS in <12 seconds indicating increased LE power.    Baseline  5xSTS in 24 seconds 12/19/2018; 5xSTS in 14s 01/16/2019    Time  4    Period  Weeks    Status  On-going    Target Date  02/13/19      PT LONG TERM GOAL #4   Title  Pt will score >50 on ABC Scale to indicate increased balance confidence and level of functioning.    Baseline  ABC score 42.5 914/2020; ABC score 85% 01/16/2019    Time  4    Period  Weeks    Status  Achieved    Target Date  01/16/19      PT LONG TERM GOAL #5   Title  Pt. will exhibit a consistent heel strike/ arm swing while ambulating with Loftstrand on level surfaces to improve mobility.    Baseline  Limited heel strike/ no arm swing.  Staggering gait pattern 12/19/2018; increased heel strike consistency with Loftstrand, limited R arm swing, intermittent gait deviations (especially with head turns) 20/03/2019    Time  4    Period  Weeks    Status  On-going    Target Date  02/13/19      Additional Long Term Goals   Additional Long Term Goals  Yes      PT LONG TERM GOAL #6   Title  Pt will demonstrate gait speed with Loftstrand greater than or equal to gait speed without AD (1.09 m/s) to increase perceived mobility with AD.    Baseline  Lofstrand gait speed 1.05 m/s 01/16/2019    Time  4    Period  Weeks    Status  New    Target Date  02/13/19            Plan - 01/18/19 1234    Clinical Impression Statement  Pt demonstrates posterior trunk leaning with backwards walking in parallel, requires frequent cueing to correct to prevent posterior LOB.  Pt demonstrates gait deviations  with lateral reaching during forward walking in // bars; requires cueing for consistent heel strike to prevent dragging  feet (R>L).  Pt requires cueing for increased hip flexion for foot clearance with stepping over objects during obstacle course.  Pt demonstrates fewer LOB (3 total with PT min assist to correct) with SLB cone tapping on airex.  Pt requires cueing to prevent excessive cross-over while maneuvering around cones.  Pt will benefit from further skilled therapy to decrease fall risk, improve gait mehanics with Loftstrand, and increase LE strength/ endurance.    Personal Factors and Comorbidities  Age;Comorbidity 1;Comorbidity 2    Examination-Activity Limitations  Stand;Stairs;Squat;Locomotion Level;Bend    Stability/Clinical Decision Making  Evolving/Moderate complexity    Clinical Decision Making  Moderate    Rehab Potential  Fair    PT Frequency  2x / week    PT Duration  4 weeks    PT Treatment/Interventions  Gait training;Stair training;ADLs/Self Care Home Management;Functional mobility training;Therapeutic activities;Therapeutic exercise;Balance training;Neuromuscular re-education;Manual techniques;Passive range of motion;Patient/family education    PT Next Visit Plan  Progress dynamic balance exercises, progress LE strengthening HEP    PT Home Exercise Plan  [phone]    Consulted and Agree with Plan of Care  Patient       Patient will benefit from skilled therapeutic intervention in order to improve the following deficits and impairments:  Decreased balance, Abnormal gait, Decreased endurance, Decreased strength, Decreased activity tolerance, Decreased mobility, Difficulty walking, Decreased cognition, Postural dysfunction  Visit Diagnosis: Unsteady gait  Leg weakness, bilateral  Balance problem     Problem List Patient Active Problem List   Diagnosis Date Noted  . OSA (obstructive sleep apnea) 05/03/2018  . Acute on chronic systolic CHF (congestive heart failure)  ([place]) 02/28/2018  . On amiodarone therapy 02/27/2018  . History of depression 02/26/2018  . Status post ablation of atrial fibrillation 02/17/2018  . Atrial fibrillation ([place]) 01/05/2018  . Heart palpitations 10/21/2017  . Diffuse cerebral atrophy ([place]) 03/20/2017  . SOB (shortness of breath) on exertion 03/19/2017  . New onset atrial flutter ([place]) 09/18/2015  . Irregular heart rhythm 09/17/2015  . Absolute anemia 02/18/2015  . Benign fibroma of prostate 02/18/2015  . Clinical depression 02/18/2015  . Abnormal prostate specific antigen 02/18/2015  . Essential (primary) hypertension 02/18/2015  . Anxiety, generalized 02/18/2015  . Insomnia 02/18/2015  . Arthritis, degenerative 02/18/2015  . Peptic ulcer 02/18/2015  . Elevated prostate specific antigen (PSA) 10/19/2013   Pura Spice, PT, DPT # [id number] Pura Spice, SPT 01/19/2019, 5:54 PM  [facility] [address] [place], [place], [address] Phone: [phone]   Fax:  [phone]  Name: Jeffrey French MRN: [id number] Date of Birth: July 10, 1950

## 2019-01-18 NOTE — Progress Notes (Signed)
Patient: Jeffrey French Male    DOB: 1950/05/03   68 y.o.   MRN: [id number] Visit Date: 01/18/2019  Today's Provider: [name], MD   Chief Complaint  Patient presents with  . Follow-up   Subjective:   HPI Patient comes in today for a follow up. He was last seen in the office 1 month ago. At that visit, he was advised to decrease Toprol XL from 50mg  to 25mg  daily. He reports that he has tolerated the med changes well. He is no longer taking Lisinopril as well.   He also mentions that he is still having back pain from previous falls. He has had 1 fall since last visit. He is also seeing physical therapy for this, and he reports that he will need to do this for at least 4 more weeks.   BP Readings from Last 3 Encounters:  01/18/19 132/72  12/06/18 108/62  11/29/18 113/66   Wt Readings from Last 3 Encounters:  01/18/19 227 lb (103 kg)  12/06/18 222 lb (100.7 kg)  11/29/18 220 lb 12.8 oz (100.2 kg)    Allergies  Allergen Reactions  . Sulfamethoxazole-Trimethoprim Rash    (in mouth)     Current Outpatient Medications:  .  alprazolam (XANAX) 2 MG tablet, Take 0.5 tablets (1 mg total) by mouth as needed., Disp: 30 tablet, Rfl: 0 .  amLODipine (NORVASC) 5 MG tablet, Take by mouth., Disp: , Rfl:  .  apixaban (ELIQUIS) 5 MG TABS tablet, Take 5 mg by mouth 2 (two) times daily., Disp: , Rfl:  .  buPROPion (WELLBUTRIN XL) 300 MG 24 hr tablet, TAKE 1 TABLET BY MOUTH EVERY DAY., Disp: 90 tablet, Rfl: 3 .  Cholecalciferol (VITAMIN D) 2000 UNITS CAPS, Take 1 capsule by mouth daily. , Disp: , Rfl:  .  finasteride (PROSCAR) 5 MG tablet, TAKE 1 TABLET BY MOUTH DAILY, Disp: 30 tablet, Rfl: 11 .  folic acid (FOLVITE) Q000111Q MCG tablet, Take 800 mcg by mouth daily. , Disp: , Rfl:  .  furosemide (LASIX) 20 MG tablet, TAKE 1 TABLET (20 MG TOTAL) BY MOUTH ONCE DAILY. TAKE AN ADDITIONAL TABLET ASNEEDED FOR WEIGHT GAIN GREATER THAN 2 POUNDS A NIGHT., Disp: , Rfl:  .   HYDROcodone-acetaminophen (NORCO/VICODIN) 5-325 MG tablet, Take 1 tablet by mouth daily., Disp: , Rfl:  .  Melatonin 1 MG TABS, Take 2 mg by mouth at bedtime. , Disp: , Rfl:  .  metaxalone (SKELAXIN) 800 MG tablet, Take 800 mg by mouth daily., Disp: , Rfl:  .  metoprolol succinate (TOPROL-XL) 50 MG 24 hr tablet, TAKE 1 TABLET BY MOUTH DAILY., Disp: 90 tablet, Rfl: 3 .  omeprazole (PRILOSEC) 20 MG capsule, TAKE 1 CAPSULE BY MOUTH EVERY DAY., Disp: 90 capsule, Rfl: 3 .  spironolactone (ALDACTONE) 25 MG tablet, Take by mouth., Disp: , Rfl:  .  tamsulosin (FLOMAX) 0.4 MG CAPS capsule, TAKE 1 CAPSULE BY MOUTH DAILY, Disp: 30 capsule, Rfl: 11 .  temazepam (RESTORIL) 30 MG capsule, TAKE 1 CAPSULE BY MOUTH EVERY DAY FOR SLEEP, Disp: 30 capsule, Rfl: 5 .  venlafaxine XR (EFFEXOR-XR) 75 MG 24 hr capsule, TAKE 3 CAPSULES BY MOUTH EVERY MORNING, Disp: 270 capsule, Rfl: 3 .  lisinopril (PRINIVIL,ZESTRIL) 5 MG tablet, Take by mouth., Disp: , Rfl:   Review of Systems  Constitutional: Negative for activity change and fatigue.  HENT: Negative.   Eyes: Negative.   Respiratory: Negative for cough and shortness of breath.  Cardiovascular: Negative for chest pain, palpitations and leg swelling.  Endocrine: Negative.   Musculoskeletal: Positive for arthralgias and myalgias.  Allergic/Immunologic: Negative.   Neurological: Negative for dizziness.  Psychiatric/Behavioral: Negative for agitation, self-injury, sleep disturbance and suicidal ideas. The patient is not nervous/anxious.     Social History   Tobacco Use  . Smoking status: Former Smoker    Types: Cigarettes    Quit date: 1974    Years since quitting: 46.8  . Smokeless tobacco: Never Used  . Tobacco comment: 1974  Substance Use Topics  . Alcohol use: Yes    Alcohol/week: 0.0 - 2.0 standard drinks      Objective:   BP 132/72   Pulse 84   Temp 98.5 F (36.9 C)   Resp 16   Ht 6\' 3"  (1.905 m)   Wt 227 lb (103 kg)   SpO2 100%   BMI 28.37  kg/m  Vitals:   01/18/19 0843  BP: 132/72  Pulse: 84  Resp: 16  Temp: 98.5 F (36.9 C)  SpO2: 100%  Weight: 227 lb (103 kg)  Height: 6\' 3"  (1.905 m)  Body mass index is 28.37 kg/m.   Physical Exam Vitals signs reviewed.  Constitutional:      Appearance: He is well-developed.  HENT:     Head: Normocephalic and atraumatic.  Eyes:     General: No scleral icterus.    Conjunctiva/sclera: Conjunctivae normal.  Neck:     Thyroid: No thyromegaly.  Cardiovascular:     Rate and Rhythm: Normal rate and regular rhythm.     Heart sounds: Normal heart sounds.  Pulmonary:     Effort: Pulmonary effort is normal.     Breath sounds: Normal breath sounds.  Abdominal:     Palpations: Abdomen is soft.  Skin:    General: Skin is warm and dry.  Neurological:     Mental Status: He is alert and oriented to person, place, and time.     Comments: Pt walks with shuffling gait.  Psychiatric:        Behavior: Behavior normal.        Thought Content: Thought content normal.        Judgment: Judgment normal.      No results found for any visits on 01/18/19.     Assessment & Plan    1. Unsteady gait Recommend PT referral. Has f/u with neurology.  2. Essential (primary) hypertension Controlled norvasc and metoprolol.  3. Need for pneumococcal vaccine Updated. - Pneumococcal polysaccharide vaccine 23-valent greater than or equal to 2yo subcutaneous/IM  4. Chronic atrial fibrillation (HCC)   5. OSA (obstructive sleep apnea)   6. Diffuse cerebral atrophy (HCC)/[facility] neurology f/u 7.MDD  In partial remission.    Tomeca Helm Cranford Mon, MD  [facility]

## 2019-01-23 ENCOUNTER — Encounter: Payer: Self-pay | Admitting: Physical Therapy

## 2019-01-23 ENCOUNTER — Ambulatory Visit: Payer: Medicare Other | Admitting: Physical Therapy

## 2019-01-23 ENCOUNTER — Other Ambulatory Visit: Payer: Self-pay

## 2019-01-23 DIAGNOSIS — R2689 Other abnormalities of gait and mobility: Secondary | ICD-10-CM

## 2019-01-23 DIAGNOSIS — R29898 Other symptoms and signs involving the musculoskeletal system: Secondary | ICD-10-CM

## 2019-01-23 DIAGNOSIS — R2681 Unsteadiness on feet: Secondary | ICD-10-CM

## 2019-01-23 NOTE — Therapy (Signed)
[facility] [address] [place], [place], [address] Phone: [phone]   Fax:  [phone]  Physical Therapy Treatment  Patient Details  Name: Jeffrey French MRN: [id number] Date of Birth: 08-Jan-1951 Referring Provider (PT): Dr. Rosanna Randy   Encounter Date: 01/23/2019  PT End of Session - 01/23/19 0844    Visit Number  10    Number of Visits  16    Date for PT Re-Evaluation  02/13/19    Authorization - Visit Number  2    Authorization - Number of Visits  10    PT Start Time  1694    PT Stop Time  0904    PT Time Calculation (min)  69 min    Equipment Utilized During Treatment  Gait belt    Activity Tolerance  Patient tolerated treatment well    Behavior During Therapy  [facility] for tasks assessed/performed       Past Medical History:  Diagnosis Date  . A-fib ([place])    resolved with ablation  . BPH (benign prostatic hyperplasia)   . GERD (gastroesophageal reflux disease)   . Hypertension   . Numbness of foot    Bilateral. since knee replacements  . Sleep apnea    moderate.  unable to tolerate CPAP    Past Surgical History:  Procedure Laterality Date  . ATRIAL FIBRILLATION ABLATION  02/26/2018   [facility]  . CATARACT EXTRACTION W/PHACO Left 11/08/2018   Procedure: CATARACT EXTRACTION PHACO AND INTRAOCULAR LENS PLACEMENT ([place]) LEFT;  Surgeon: Birder Robson, MD;  Location: [place];  Service: Ophthalmology;  Laterality: Left;  sleep apnea not a Toric per [facility]  . CATARACT EXTRACTION W/PHACO Right 11/29/2018   Procedure: CATARACT EXTRACTION PHACO AND INTRAOCULAR LENS PLACEMENT ([facility]) RIGHT  00:50.9  17.4%  8.88;  Surgeon: Birder Robson, MD;  Location: [place];  Service: Ophthalmology;  Laterality: Right;  . ELECTROPHYSIOLOGIC STUDY N/A 10/09/2015   Procedure: Cardioversion;  Surgeon: Isaias Cowman, MD;  Location: [facility];  Service: Cardiovascular;  Laterality: N/A;  . NASAL RECONSTRUCTION WITH SEPTAL  REPAIR    . REPLACEMENT TOTAL KNEE Right   . SHOULDER SURGERY Left   . TONSILLECTOMY      There were no vitals filed for this visit.  Subjective Assessment - 01/23/19 0839    Subjective  Pt entered using loftstrand.  Pt reports in increase in LBP today.  Pt. reports he did a lot of walking at [employer] this weekend. No LOB this past weekend.    Pertinent History  Dizziness, Unsteady gait, Hx of falls, Chronic Atrial Fibrillation, Obstructive Sleep Apnea, Diffuse cerebral atrophy, Mild cognitive impairment, Recurrent major depressive disorder, B TKA >6years ago    Limitations  Standing;Walking;House hold activities    Patient Stated Goals  Improve walking/ balance.   Decrease fall risk    Currently in Pain?  Yes    Pain Score  5     Pain Location  Back    Pain Orientation  Lower    Pain Descriptors / Indicators  Aching         Therapeutic Exercise: Scifit L4 x10 min. B UE/LE (decrease resistance today secondary to increase LBP)  Marching forward/ backward in // bars with 5# ankle weights - x4 with cueing to maintain proper BOS   Standing 5# hip ex.: flexion/ abduction/ extension 20x.  Lateral walking in // bars with 5# ankle weights - x4  Supine LE/lumbar stretches (hamstring/ hip flexor/ LAD to  L/R hips 3x)- 12 min.     Neuro: Walking in hallway with cueing for posture, consistent heel strike, and hip flexion for sufficient foot clearance  Star ex.: L/R touches (1 LOB requiring max. Assist to prevent fall)  Gait on outdoor surfaces, grass, curbs   PT Long Term Goals - 01/16/19 1442      PT LONG TERM GOAL #1   Title  Pt will increase FOTO score to 55 (age norm) to indicate improved functional status.    Baseline  FOTO score 58 12/19/2018; FOTO score 66 01/16/2019    Time  4    Period  Weeks    Status  Partially Met    Target Date  02/13/19      PT LONG TERM GOAL #2   Title  Pt wil score >45/56 on the Berg Balance Test to demonstrate decreased in fall  risk.    Baseline  Berg score 38/56 12/19/2018; Berg score 45/56 01/16/2019    Time  4    Period  Weeks    Status  Partially Met    Target Date  02/13/19      PT LONG TERM GOAL #3   Title  Pt will perform 5xSTS in <12 seconds indicating increased LE power.    Baseline  5xSTS in 24 seconds 12/19/2018; 5xSTS in 14s 01/16/2019    Time  4    Period  Weeks    Status  On-going    Target Date  02/13/19      PT LONG TERM GOAL #4   Title  Pt will score >50 on ABC Scale to indicate increased balance confidence and level of functioning.    Baseline  ABC score 42.5 914/2020; ABC score 85% 01/16/2019    Time  4    Period  Weeks    Status  Achieved    Target Date  01/16/19      PT LONG TERM GOAL #5   Title  Pt. will exhibit a consistent heel strike/ arm swing while ambulating with Loftstrand on level surfaces to improve mobility.    Baseline  Limited heel strike/ no arm swing.  Staggering gait pattern 12/19/2018; increased heel strike consistency with Loftstrand, limited R arm swing, intermittent gait deviations (especially with head turns) 20/03/2019    Time  4    Period  Weeks    Status  On-going    Target Date  02/13/19      Additional Long Term Goals   Additional Long Term Goals  Yes      PT LONG TERM GOAL #6   Title  Pt will demonstrate gait speed with Loftstrand greater than or equal to gait speed without AD (1.09 m/s) to increase perceived mobility with AD.    Baseline  Lofstrand gait speed 1.05 m/s 01/16/2019    Time  4    Period  Weeks    Status  New    Target Date  02/13/19         Plan - 01/23/19 0844    Clinical Impression Statement  Tx. progression limited today due to increase c/o low back pain this morning.  Pt. reports no radicular symptoms but c/o B low back discomfort with standing ther.ex./ balance tasks.  Pt. had several LOB while performing star ex. requiring max. assist to prevent fall.  Pt. requires several seated rest breaks to decrease back pain.  Pt. continues  to benefit from use of Loftstrand crutch with all aspects of walking, esp. outside.  No change to HEP at this time.    Personal Factors and Comorbidities  Age;Comorbidity 1;Comorbidity 2    Examination-Activity Limitations  Stand;Stairs;Squat;Locomotion Level;Bend    Stability/Clinical Decision Making  Evolving/Moderate complexity    Clinical Decision Making  Moderate    Rehab Potential  Fair    PT Frequency  2x / week    PT Duration  4 weeks    PT Treatment/Interventions  Gait training;Stair training;ADLs/Self Care Home Management;Functional mobility training;Therapeutic activities;Therapeutic exercise;Balance training;Neuromuscular re-education;Manual techniques;Passive range of motion;Patient/family education    PT Next Visit Plan  Progress dynamic balance exercises, progress LE strengthening HEP    PT Home Exercise Plan  [phone]    Consulted and Agree with Plan of Care  Patient       Patient will benefit from skilled therapeutic intervention in order to improve the following deficits and impairments:  Decreased balance, Abnormal gait, Decreased endurance, Decreased strength, Decreased activity tolerance, Decreased mobility, Difficulty walking, Decreased cognition, Postural dysfunction  Visit Diagnosis: Unsteady gait  Leg weakness, bilateral  Balance problem     Problem List Patient Active Problem List   Diagnosis Date Noted  . OSA (obstructive sleep apnea) 05/03/2018  . Acute on chronic systolic CHF (congestive heart failure) ([place]) 02/28/2018  . On amiodarone therapy 02/27/2018  . History of depression 02/26/2018  . Status post ablation of atrial fibrillation 02/17/2018  . Atrial fibrillation ([place]) 01/05/2018  . Heart palpitations 10/21/2017  . Diffuse cerebral atrophy ([place]) 03/20/2017  . SOB (shortness of breath) on exertion 03/19/2017  . New onset atrial flutter ([place]) 09/18/2015  . Irregular heart rhythm 09/17/2015  . Absolute anemia 02/18/2015  . Benign fibroma of  prostate 02/18/2015  . Clinical depression 02/18/2015  . Abnormal prostate specific antigen 02/18/2015  . Essential (primary) hypertension 02/18/2015  . Anxiety, generalized 02/18/2015  . Insomnia 02/18/2015  . Arthritis, degenerative 02/18/2015  . Peptic ulcer 02/18/2015  . Elevated prostate specific antigen (PSA) 10/19/2013   Pura Spice, PT, DPT # [phone] 01/23/2019, 10:15 AM  [facility] [address] [place], [place], [address] Phone: [phone]   Fax:  [phone]  Name: Jeffrey French MRN: [id number] Date of Birth: Jun 20, 1950

## 2019-01-25 ENCOUNTER — Ambulatory Visit: Payer: Medicare Other | Admitting: Physical Therapy

## 2019-01-25 ENCOUNTER — Encounter: Payer: Self-pay | Admitting: Physical Therapy

## 2019-01-25 ENCOUNTER — Other Ambulatory Visit: Payer: Self-pay

## 2019-01-25 DIAGNOSIS — R2681 Unsteadiness on feet: Secondary | ICD-10-CM

## 2019-01-25 DIAGNOSIS — R29898 Other symptoms and signs involving the musculoskeletal system: Secondary | ICD-10-CM | POA: Diagnosis not present

## 2019-01-25 DIAGNOSIS — R2689 Other abnormalities of gait and mobility: Secondary | ICD-10-CM

## 2019-01-25 NOTE — Therapy (Signed)
[facility] [address]. [place], [place], [address] Phone: [phone]   Fax:  [phone]  Physical Therapy Treatment  Patient Details  Name: Jeffrey French MRN: [id number] Date of Birth: 11-11-1950 Referring Provider (PT): Dr. Rosanna Randy   Encounter Date: 01/25/2019  PT End of Session - 01/25/19 0820    Visit Number  11    Number of Visits  16    Date for PT Re-Evaluation  02/13/19    Authorization - Visit Number  3    Authorization - Number of Visits  10    PT Start Time  0810    PT Stop Time  0859    PT Time Calculation (min)  49 min    Equipment Utilized During Treatment  Gait belt    Activity Tolerance  Patient tolerated treatment well    Behavior During Therapy  [facility] for tasks assessed/performed       Past Medical History:  Diagnosis Date  . A-fib ([place])    resolved with ablation  . BPH (benign prostatic hyperplasia)   . GERD (gastroesophageal reflux disease)   . Hypertension   . Numbness of foot    Bilateral. since knee replacements  . Sleep apnea    moderate.  unable to tolerate CPAP    Past Surgical History:  Procedure Laterality Date  . ATRIAL FIBRILLATION ABLATION  02/26/2018   [facility]  . CATARACT EXTRACTION W/PHACO Left 11/08/2018   Procedure: CATARACT EXTRACTION PHACO AND INTRAOCULAR LENS PLACEMENT ([place]) LEFT;  Surgeon: Birder Robson, MD;  Location: [place];  Service: Ophthalmology;  Laterality: Left;  sleep apnea not a Toric per [place]  . CATARACT EXTRACTION W/PHACO Right 11/29/2018   Procedure: CATARACT EXTRACTION PHACO AND INTRAOCULAR LENS PLACEMENT ([facility]) RIGHT  00:50.9  17.4%  8.88;  Surgeon: Birder Robson, MD;  Location: [place];  Service: Ophthalmology;  Laterality: Right;  . ELECTROPHYSIOLOGIC STUDY N/A 10/09/2015   Procedure: Cardioversion;  Surgeon: Isaias Cowman, MD;  Location: [facility];  Service: Cardiovascular;  Laterality: N/A;  . NASAL RECONSTRUCTION WITH SEPTAL  REPAIR    . REPLACEMENT TOTAL KNEE Right   . SHOULDER SURGERY Left   . TONSILLECTOMY      There were no vitals filed for this visit.  Subjective Assessment - 01/25/19 0814    Subjective  Pt reports LBP 3/10, agitated by walking, resolved with sitting.  Pt enters PT with lofstrand with significant WB on lofstrand. Pt reports he did not do HEP 2/2 LBP.    Pertinent History  Dizziness, Unsteady gait, Hx of falls, Chronic Atrial Fibrillation, Obstructive Sleep Apnea, Diffuse cerebral atrophy, Mild cognitive impairment, Recurrent major depressive disorder, B TKA >6years ago    Limitations  Standing;Walking;House hold activities    Patient Stated Goals  Improve walking/ balance.   Decrease fall risk    Currently in Pain?  Yes    Pain Score  3     Pain Location  Back    Pain Orientation  Lower    Pain Descriptors / Indicators  Aching        Therapeutic Exercise: Scifit L4 x10 min Gait in hallway - x4 with  increase in heel strike consistency and increased hip flexion during swing for sufficient foot clearance Gait in hallway with head turns - x4 Step ups with loftstrand + railing - x10  Neuro: Stairs with loftstrand + railing - x3 Obstacle courses: Gait on uneven surface, SLB on foam with anterior/ medial tapping on  6" plinth Gait on uneven surface around cones, SLB on foam with alternating cone tapping on 6" plinth Gait on uneven surface alternating cone taps, SLB on foam with anterior/ medial cone tapping on 6" plinth    PT Education - 01/25/19 0819    Education Details  Pt educated on gait with lofstand in L UE    Person(s) Educated  Patient    Methods  Explanation;Demonstration;Verbal cues    Comprehension  Verbalized understanding;Returned demonstration          PT Long Term Goals - 01/16/19 1442      PT LONG TERM GOAL #1   Title  Pt will increase FOTO score to 62 (age norm) to indicate improved functional status.    Baseline  FOTO score 58 12/19/2018; FOTO score 66  01/16/2019    Time  4    Period  Weeks    Status  Partially Met    Target Date  02/13/19      PT LONG TERM GOAL #2   Title  Pt wil score >45/56 on the Berg Balance Test to demonstrate decreased in fall risk.    Baseline  Berg score 38/56 12/19/2018; Berg score 45/56 01/16/2019    Time  4    Period  Weeks    Status  Partially Met    Target Date  02/13/19      PT LONG TERM GOAL #3   Title  Pt will perform 5xSTS in <12 seconds indicating increased LE power.    Baseline  5xSTS in 24 seconds 12/19/2018; 5xSTS in 14s 01/16/2019    Time  4    Period  Weeks    Status  On-going    Target Date  02/13/19      PT LONG TERM GOAL #4   Title  Pt will score >50 on ABC Scale to indicate increased balance confidence and level of functioning.    Baseline  ABC score 42.5 914/2020; ABC score 85% 01/16/2019    Time  4    Period  Weeks    Status  Achieved    Target Date  01/16/19      PT LONG TERM GOAL #5   Title  Pt. will exhibit a consistent heel strike/ arm swing while ambulating with Loftstrand on level surfaces to improve mobility.    Baseline  Limited heel strike/ no arm swing.  Staggering gait pattern 12/19/2018; increased heel strike consistency with Loftstrand, limited R arm swing, intermittent gait deviations (especially with head turns) 20/03/2019    Time  4    Period  Weeks    Status  On-going    Target Date  02/13/19      Additional Long Term Goals   Additional Long Term Goals  Yes      PT LONG TERM GOAL #6   Title  Pt will demonstrate gait speed with Loftstrand greater than or equal to gait speed without AD (1.09 m/s) to increase perceived mobility with AD.    Baseline  Lofstrand gait speed 1.05 m/s 01/16/2019    Time  4    Period  Weeks    Status  New    Target Date  02/13/19            Plan - 01/25/19 1109    Clinical Impression Statement  Pt tolerated tx well today with no significant increase in LBP.  Gait entering clinic with loftstrand revealed UE WB through  loftstrand with trunk lateral flexion; corrected with increase  in loftstrand height (pt reports using his second loftstrand today).  Pt demonstrates improved gait with loftstrand in hallway with no marked gait inconsistencies; in comparison, gait with head turns reveals increase in gait deviations and decrease in heel strike consistency that has improved since previous sessions.  Pt will benefit from further skilled therapy to improve gait/ balance, increase LE strength, and reduce fall risk.    Personal Factors and Comorbidities  Age;Comorbidity 1;Comorbidity 2    Examination-Activity Limitations  Stand;Stairs;Squat;Locomotion Level;Bend    Stability/Clinical Decision Making  Evolving/Moderate complexity    Clinical Decision Making  Moderate    Rehab Potential  Fair    PT Frequency  2x / week    PT Duration  4 weeks    PT Treatment/Interventions  Gait training;Stair training;ADLs/Self Care Home Management;Functional mobility training;Therapeutic activities;Therapeutic exercise;Balance training;Neuromuscular re-education;Manual techniques;Passive range of motion;Patient/family education    PT Next Visit Plan  Sit<>stands, gait/ balance exercises    PT Home Exercise Plan  [phone]    Consulted and Agree with Plan of Care  Patient       Patient will benefit from skilled therapeutic intervention in order to improve the following deficits and impairments:  Decreased balance, Abnormal gait, Decreased endurance, Decreased strength, Decreased activity tolerance, Decreased mobility, Difficulty walking, Decreased cognition, Postural dysfunction  Visit Diagnosis: Unsteady gait  Leg weakness, bilateral  Balance problem     Problem List Patient Active Problem List   Diagnosis Date Noted  . OSA (obstructive sleep apnea) 05/03/2018  . Acute on chronic systolic CHF (congestive heart failure) ([place]) 02/28/2018  . On amiodarone therapy 02/27/2018  . History of depression 02/26/2018  . Status post  ablation of atrial fibrillation 02/17/2018  . Atrial fibrillation ([place]) 01/05/2018  . Heart palpitations 10/21/2017  . Diffuse cerebral atrophy ([place]) 03/20/2017  . SOB (shortness of breath) on exertion 03/19/2017  . New onset atrial flutter ([place]) 09/18/2015  . Irregular heart rhythm 09/17/2015  . Absolute anemia 02/18/2015  . Benign fibroma of prostate 02/18/2015  . Clinical depression 02/18/2015  . Abnormal prostate specific antigen 02/18/2015  . Essential (primary) hypertension 02/18/2015  . Anxiety, generalized 02/18/2015  . Insomnia 02/18/2015  . Arthritis, degenerative 02/18/2015  . Peptic ulcer 02/18/2015  . Elevated prostate specific antigen (PSA) 10/19/2013   Pura Spice, PT, DPT # [id number] Chinita Greenland, SPT 01/25/2019, 1:05 PM  [facility] [address] [place], [place], [address] Phone: [phone]   Fax:  [phone]  Name: GWENDOLYN NISHI MRN: [id number] Date of Birth: 01-05-1951

## 2019-01-26 ENCOUNTER — Telehealth: Payer: Self-pay | Admitting: Family Medicine

## 2019-01-26 NOTE — Chronic Care Management (AMB) (Signed)
Chronic Care Management  ° °Note ° °01/26/2019 °Name: Jeffrey French MRN: [id number] DOB: 06/15/1950 ° °[name] is a 67 y.o. year old male who is a primary care patient of Gilbert, Richard L Jr., MD. I reached out to Zacary L Luberto by phone today in response to a referral sent by Mr. Timm L Underhill's health plan.    ° °Mr. Blanchett was given information about Chronic Care Management services today including:  °1. CCM service includes personalized support from designated clinical staff supervised by his physician, including individualized plan of care and coordination with other care providers °2. 24/7 contact phone numbers for assistance for urgent and routine care needs. °3. Service will only be billed when office clinical staff spend 20 minutes or more in a month to coordinate care. °4. Only one practitioner may furnish and bill the service in a calendar month. °5. The patient may stop CCM services at any time (effective at the end of the month) by phone call to the office staff. °6. The patient will be responsible for cost sharing (co-pay) of up to 20% of the service fee (after annual deductible is met). ° °Patient did not agree to enrollment in care management services and does not wish to consider at this time. ° °Follow up plan: °The patient has been provided with contact information for the chronic care management team and has been advised to call with any health related questions or concerns.  ° °Bernice Cicero °Care Guide • [facility] °[place]   Connected Care  °[email or web address]   ??[phone]   ° ° ° °

## 2019-01-30 ENCOUNTER — Encounter: Payer: Medicare Other | Admitting: Physical Therapy

## 2019-02-01 ENCOUNTER — Other Ambulatory Visit: Payer: Self-pay

## 2019-02-01 ENCOUNTER — Ambulatory Visit: Payer: Medicare Other

## 2019-02-01 ENCOUNTER — Encounter: Payer: Self-pay | Admitting: Physical Therapy

## 2019-02-01 DIAGNOSIS — R29898 Other symptoms and signs involving the musculoskeletal system: Secondary | ICD-10-CM

## 2019-02-01 DIAGNOSIS — R2689 Other abnormalities of gait and mobility: Secondary | ICD-10-CM | POA: Diagnosis not present

## 2019-02-01 DIAGNOSIS — R2681 Unsteadiness on feet: Secondary | ICD-10-CM | POA: Diagnosis not present

## 2019-02-01 NOTE — Therapy (Signed)
[facility] [address] [place], [place], [address] Phone: [phone]   Fax:  [phone]  Physical Therapy Treatment and Discharge  Patient Details  Name: Jeffrey French MRN: [id number] Date of Birth: 04-05-1951 Referring Provider (PT): Dr. Rosanna Randy   Encounter Date: 02/01/2019  PT End of Session - 02/01/19 0810    Visit Number  12    Number of Visits  16    Date for PT Re-Evaluation  02/13/19    Authorization - Visit Number  4    Authorization - Number of Visits  10    PT Start Time  0810    PT Stop Time  0900    PT Time Calculation (min)  50 min    Equipment Utilized During Treatment  Gait belt    Activity Tolerance  Patient tolerated treatment well    Behavior During Therapy  [facility] for tasks assessed/performed       Past Medical History:  Diagnosis Date  . A-fib ([place])    resolved with ablation  . BPH (benign prostatic hyperplasia)   . GERD (gastroesophageal reflux disease)   . Hypertension   . Numbness of foot    Bilateral. since knee replacements  . Sleep apnea    moderate.  unable to tolerate CPAP    Past Surgical History:  Procedure Laterality Date  . ATRIAL FIBRILLATION ABLATION  02/26/2018   [facility]  . CATARACT EXTRACTION W/PHACO Left 11/08/2018   Procedure: CATARACT EXTRACTION PHACO AND INTRAOCULAR LENS PLACEMENT ([place]) LEFT;  Surgeon: Birder Robson, MD;  Location: [place];  Service: Ophthalmology;  Laterality: Left;  sleep apnea not a Toric per [place]  . CATARACT EXTRACTION W/PHACO Right 11/29/2018   Procedure: CATARACT EXTRACTION PHACO AND INTRAOCULAR LENS PLACEMENT ([facility]) RIGHT  00:50.9  17.4%  8.88;  Surgeon: Birder Robson, MD;  Location: [place];  Service: Ophthalmology;  Laterality: Right;  . ELECTROPHYSIOLOGIC STUDY N/A 10/09/2015   Procedure: Cardioversion;  Surgeon: Isaias Cowman, MD;  Location: [facility];  Service: Cardiovascular;  Laterality: N/A;  . NASAL RECONSTRUCTION  WITH SEPTAL REPAIR    . REPLACEMENT TOTAL KNEE Right   . SHOULDER SURGERY Left   . TONSILLECTOMY      There were no vitals filed for this visit.  Subjective Assessment - 02/01/19 0824    Subjective  Pt reports 1/10 pain today. Pt enters PT with loftstrand.  Pt reports not doing HEP because of being busy.    Pertinent History  Dizziness, Unsteady gait, Hx of falls, Chronic Atrial Fibrillation, Obstructive Sleep Apnea, Diffuse cerebral atrophy, Mild cognitive impairment, Recurrent major depressive disorder, B TKA >6years ago    Limitations  Standing;Walking;House hold activities    Patient Stated Goals  Improve walking/ balance.   Decrease fall risk    Currently in Pain?  Yes    Pain Location  Back    Pain Orientation  Lower    Pain Descriptors / Indicators  Aching       Goals Assessed: FOTO: 63 5xSTS: 14 sec Gait speed: 0.94 at self-selected speed Gait in clinic with loftstrand: Pt demonstrates increased coordination with use of loftstrand with two-point step-through gait pattern and consistent heel strike, using loftstrand as balance aid with minimal WB through L UE.  Pt educated and expressed understanding of decreasing gait velocity with turning and head turns to maximize balance/ minimize gait deviations.  Therapeutic Exercise: Marching forward/ backward with 5# ankle weights - x4 Lateral walking with 5# ankle  weights - x4 Standing LE exercises with 5# ankle weights:  Marching - 1x10 with B UE support, 1x10 with unilateral UE support  Hip abduction - 1x20 B  Hamstring curls - 1x20 B  Hip extension - 1x20 B  B heel raises - 1x10  SLB - 1x30 sec hold with B UE support Unilateral heel raises - 1x10 B with B UE support  Clinical Impression: Pt two weeks ago performed better than goals assessed this session; however, despite today's session, pt has shown balance improvement, decreased pain, and improved gait with loftstrand.  Pt FOTO demonstrates improvement from baseline  indicating increased functional mobility.  Pt is willing and expresses readiness to cease therapy, reports feeling 60-70% improved with PT and expresses compliance with comprehensive HEP given today for self-management.      PT Education - 02/01/19 0809    Education Details  Pt educated on decreasing gait velocity with turning and head turns to maximize balance/ minimize gait deviations. Educated on comprehensive HEP, readiness for discharge    Person(s) Educated  Patient    Methods  Explanation;Demonstration    Comprehension  Verbalized understanding;Returned demonstration          PT Long Term Goals - 02/01/19 1042      PT LONG TERM GOAL #1   Title  Pt will increase FOTO score to 36 (age norm) to indicate improved functional status.    Baseline  FOTO score 58 12/19/2018; FOTO score 66 01/16/2019; FOTO score 63 02/01/2019    Time  4    Period  Weeks    Status  Partially Met    Target Date  02/01/19      PT LONG TERM GOAL #2   Title  Pt wil score >45/56 on the Berg Balance Test to demonstrate decreased in fall risk.    Baseline  Berg score 38/56 12/19/2018; Berg score 45/56 01/16/2019    Time  4    Period  Weeks    Status  Achieved    Target Date  02/01/19      PT LONG TERM GOAL #3   Title  Pt will perform 5xSTS in <12 seconds indicating increased LE power.    Baseline  5xSTS in 24 seconds 12/19/2018; 5xSTS in 14s 01/16/2019; 5xSTS in 14s 02/01/2019    Time  4    Period  Weeks    Status  Partially Met    Target Date  02/01/19      PT LONG TERM GOAL #4   Title  Pt will score >50 on ABC Scale to indicate increased balance confidence and level of functioning.    Baseline  ABC score 42.5 914/2020; ABC score 85% 01/16/2019    Time  4    Period  Weeks    Status  Achieved      PT LONG TERM GOAL #5   Title  Pt. will exhibit a consistent heel strike/ arm swing while ambulating with Loftstrand on level surfaces to improve mobility.    Baseline  Limited heel strike/ no arm swing.   Staggering gait pattern 12/19/2018; increased heel strike consistency with Loftstrand, limited R arm swing, intermittent gait deviations (especially with head turns) 20/03/2019  Pt demonstrates increased coordination with use of loftstrand with two-point step-through gait pattern and consistent heel strike, using loftstrand as balance aid with minimal WB through L UE.  Pt educated and expressed understanding of decreasing gait velocity with turning and head turns. 02/01/2019    Time  4  Period  Weeks    Status  Achieved    Target Date  02/01/19      PT LONG TERM GOAL #6   Title  Pt will demonstrate gait speed with Loftstrand greater than or equal to gait speed without AD (1.09 m/s) to increase perceived mobility with AD.    Baseline  Lofstrand gait speed 1.05 m/s 01/16/2019, Loftstrand self-selected gait speed 0.94 m/s 02/01/2019    Time  4    Period  Weeks    Status  Not Met    Target Date  02/01/19            Plan - 02/01/19 0825    Clinical Impression Statement  Pt two weeks ago performed better than goals assessed this session; however, despite today's session, pt has shown balance improvement, decreased pain, and improved gait with loftstrand.  Pt FOTO demonstrates improvement from baseline indicating increased functional mobility.  Pt is willing and expresses readiness to cease therapy, reports feeling 60-70% improved with PT and expresses compliance with comprehensive HEP given today for self-management.    Personal Factors and Comorbidities  Age;Comorbidity 1;Comorbidity 2    Examination-Activity Limitations  Stand;Stairs;Squat;Locomotion Level;Bend    Stability/Clinical Decision Making  Evolving/Moderate complexity    Clinical Decision Making  Moderate    Rehab Potential  Fair    PT Frequency  --    PT Duration  --   discharge   PT Treatment/Interventions  Gait training;Stair training;ADLs/Self Care Home Management;Functional mobility training;Therapeutic  activities;Therapeutic exercise;Balance training;Neuromuscular re-education;Manual techniques;Passive range of motion;Patient/family education    PT Next Visit Plan  Discharged today with comprehensive HEP for self-management at home; instructed to call with any additional concerns.    PT Home Exercise Plan  [phone]    Consulted and Agree with Plan of Care  Patient       Patient will benefit from skilled therapeutic intervention in order to improve the following deficits and impairments:  Decreased balance, Abnormal gait, Decreased endurance, Decreased strength, Decreased activity tolerance, Decreased mobility, Difficulty walking, Decreased cognition, Postural dysfunction  Visit Diagnosis: Unsteady gait  Leg weakness, bilateral  Balance problem     Problem List Patient Active Problem List   Diagnosis Date Noted  . OSA (obstructive sleep apnea) 05/03/2018  . Acute on chronic systolic CHF (congestive heart failure) ([place]) 02/28/2018  . On amiodarone therapy 02/27/2018  . History of depression 02/26/2018  . Status post ablation of atrial fibrillation 02/17/2018  . Atrial fibrillation ([place] [address]) 01/05/2018  . Heart palpitations 10/21/2017  . Diffuse cerebral atrophy ([place]) 03/20/2017  . SOB (shortness of breath) on exertion 03/19/2017  . New onset atrial flutter ([place]) 09/18/2015  . Irregular heart rhythm 09/17/2015  . Absolute anemia 02/18/2015  . Benign fibroma of prostate 02/18/2015  . Clinical depression 02/18/2015  . Abnormal prostate specific antigen 02/18/2015  . Essential (primary) hypertension 02/18/2015  . Anxiety, generalized 02/18/2015  . Insomnia 02/18/2015  . Arthritis, degenerative 02/18/2015  . Peptic ulcer 02/18/2015  . Elevated prostate specific antigen (PSA) 10/19/2013    Chinita Greenland, SPT [facility] [address] [place], [place], [address] Phone: [phone]   Fax:  [phone]  Name: SHNEUR WHITTENBURG MRN:  [id number] Date of Birth: [birth year]

## 2019-02-06 ENCOUNTER — Encounter: Payer: Medicare Other | Admitting: Physical Therapy

## 2019-02-06 DIAGNOSIS — R202 Paresthesia of skin: Secondary | ICD-10-CM | POA: Diagnosis not present

## 2019-02-06 DIAGNOSIS — R200 Anesthesia of skin: Secondary | ICD-10-CM | POA: Diagnosis not present

## 2019-02-06 DIAGNOSIS — G3184 Mild cognitive impairment, so stated: Secondary | ICD-10-CM | POA: Diagnosis not present

## 2019-02-06 DIAGNOSIS — E538 Deficiency of other specified B group vitamins: Secondary | ICD-10-CM | POA: Diagnosis not present

## 2019-02-06 DIAGNOSIS — E559 Vitamin D deficiency, unspecified: Secondary | ICD-10-CM | POA: Diagnosis not present

## 2019-02-06 DIAGNOSIS — E531 Pyridoxine deficiency: Secondary | ICD-10-CM | POA: Diagnosis not present

## 2019-02-06 DIAGNOSIS — R208 Other disturbances of skin sensation: Secondary | ICD-10-CM | POA: Diagnosis not present

## 2019-02-06 DIAGNOSIS — E519 Thiamine deficiency, unspecified: Secondary | ICD-10-CM | POA: Diagnosis not present

## 2019-02-08 ENCOUNTER — Encounter: Payer: Medicare Other | Admitting: Physical Therapy

## 2019-02-13 ENCOUNTER — Encounter: Payer: Medicare Other | Admitting: Physical Therapy

## 2019-02-13 ENCOUNTER — Other Ambulatory Visit: Payer: Self-pay | Admitting: Neurology

## 2019-02-13 DIAGNOSIS — G3184 Mild cognitive impairment, so stated: Secondary | ICD-10-CM

## 2019-02-15 ENCOUNTER — Encounter: Payer: Medicare Other | Admitting: Physical Therapy

## 2019-02-20 ENCOUNTER — Ambulatory Visit: Payer: Self-pay | Admitting: Family Medicine

## 2019-02-23 ENCOUNTER — Ambulatory Visit (HOSPITAL_COMMUNITY): Admission: RE | Admit: 2019-02-23 | Payer: Medicare Other | Source: Ambulatory Visit

## 2019-02-23 ENCOUNTER — Encounter (HOSPITAL_COMMUNITY): Payer: Self-pay

## 2019-03-09 DIAGNOSIS — R0602 Shortness of breath: Secondary | ICD-10-CM | POA: Diagnosis not present

## 2019-03-09 DIAGNOSIS — I4819 Other persistent atrial fibrillation: Secondary | ICD-10-CM | POA: Diagnosis not present

## 2019-03-09 DIAGNOSIS — I420 Dilated cardiomyopathy: Secondary | ICD-10-CM | POA: Diagnosis not present

## 2019-03-09 DIAGNOSIS — I4891 Unspecified atrial fibrillation: Secondary | ICD-10-CM | POA: Diagnosis not present

## 2019-03-09 DIAGNOSIS — R002 Palpitations: Secondary | ICD-10-CM | POA: Diagnosis not present

## 2019-03-09 DIAGNOSIS — I10 Essential (primary) hypertension: Secondary | ICD-10-CM | POA: Diagnosis not present

## 2019-03-09 DIAGNOSIS — I4892 Unspecified atrial flutter: Secondary | ICD-10-CM | POA: Diagnosis not present

## 2019-03-14 ENCOUNTER — Other Ambulatory Visit: Payer: Self-pay

## 2019-03-14 ENCOUNTER — Ambulatory Visit (HOSPITAL_COMMUNITY)
Admission: RE | Admit: 2019-03-14 | Discharge: 2019-03-14 | Disposition: A | Discharge status: Home / Self Care | Payer: Medicare Other | Source: Ambulatory Visit | Attending: Neurology | Admitting: Neurology

## 2019-03-14 DIAGNOSIS — G3184 Mild cognitive impairment, so stated: Secondary | ICD-10-CM | POA: Diagnosis not present

## 2019-04-26 ENCOUNTER — Telehealth: Payer: Self-pay | Admitting: Family Medicine

## 2019-04-26 NOTE — Telephone Encounter (Signed)
1- [employer] ins denying the Omeprazole to be taken 20 mg bid.   He needs dr. Rosanna Randy to send them something specifying he takes this bid so he can get the correct # of pills.  2- Patient takes 3 Venlafaxine 75 mg. Tabs daily.  [facility] is denying the proper quantity of pills.    Dr. Rosanna Randy needs to send something verifying patient needs 3 pills per day.

## 2019-04-26 NOTE — Telephone Encounter (Signed)
[facility], who stated that they did not see any issues with pt's refills, which he picked up on 04/17/2019. LMOVM for pt to return call.

## 2019-04-28 NOTE — Telephone Encounter (Signed)
LMOVM for pt to return call 

## 2019-05-04 NOTE — Telephone Encounter (Signed)
LMOVM for patient to cb.

## 2019-05-05 NOTE — Telephone Encounter (Signed)
Patient not returning calls, so will close call for now.

## 2019-05-22 NOTE — Progress Notes (Deleted)
Patient: Jeffrey French Male    DOB: 1950-07-15   69 y.o.   MRN: [id number] Visit Date: 05/22/2019  Today's Provider: [name], MD   No chief complaint on file.  Subjective:     HPI   Unsteady gait From 01/18/2019-Recommended PT referral. Has f/u with neurology.  Essential (primary) hypertension From 01/18/2019-Controlled norvasc and metoprolol.  Chronic atrial fibrillation (HCC) From 01/18/2019-no changes were made.    Allergies  Allergen Reactions  . Sulfamethoxazole-Trimethoprim Rash    (in mouth)     Current Outpatient Medications:  .  alprazolam (XANAX) 2 MG tablet, Take 0.5 tablets (1 mg total) by mouth as needed., Disp: 30 tablet, Rfl: 0 .  amLODipine (NORVASC) 5 MG tablet, Take by mouth., Disp: , Rfl:  .  apixaban (ELIQUIS) 5 MG TABS tablet, Take 5 mg by mouth 2 (two) times daily., Disp: , Rfl:  .  buPROPion (WELLBUTRIN XL) 300 MG 24 hr tablet, TAKE 1 TABLET BY MOUTH EVERY DAY., Disp: 90 tablet, Rfl: 3 .  Cholecalciferol (VITAMIN D) 2000 UNITS CAPS, Take 1 capsule by mouth daily. , Disp: , Rfl:  .  finasteride (PROSCAR) 5 MG tablet, TAKE 1 TABLET BY MOUTH DAILY, Disp: 30 tablet, Rfl: 11 .  folic acid (FOLVITE) Q000111Q MCG tablet, Take 800 mcg by mouth daily. , Disp: , Rfl:  .  furosemide (LASIX) 20 MG tablet, TAKE 1 TABLET (20 MG TOTAL) BY MOUTH ONCE DAILY. TAKE AN ADDITIONAL TABLET ASNEEDED FOR WEIGHT GAIN GREATER THAN 2 POUNDS A NIGHT., Disp: , Rfl:  .  HYDROcodone-acetaminophen (NORCO/VICODIN) 5-325 MG tablet, Take 1 tablet by mouth daily., Disp: , Rfl:  .  lisinopril (PRINIVIL,ZESTRIL) 5 MG tablet, Take by mouth., Disp: , Rfl:  .  Melatonin 1 MG TABS, Take 2 mg by mouth at bedtime. , Disp: , Rfl:  .  metaxalone (SKELAXIN) 800 MG tablet, Take 800 mg by mouth daily., Disp: , Rfl:  .  metoprolol succinate (TOPROL-XL) 50 MG 24 hr tablet, TAKE 1 TABLET BY MOUTH DAILY., Disp: 90 tablet, Rfl: 3 .  omeprazole (PRILOSEC) 20 MG capsule, TAKE 1 CAPSULE BY  MOUTH EVERY DAY., Disp: 90 capsule, Rfl: 3 .  spironolactone (ALDACTONE) 25 MG tablet, Take by mouth., Disp: , Rfl:  .  tamsulosin (FLOMAX) 0.4 MG CAPS capsule, TAKE 1 CAPSULE BY MOUTH DAILY, Disp: 30 capsule, Rfl: 11 .  temazepam (RESTORIL) 30 MG capsule, TAKE 1 CAPSULE BY MOUTH EVERY DAY FOR SLEEP, Disp: 30 capsule, Rfl: 5 .  venlafaxine XR (EFFEXOR-XR) 75 MG 24 hr capsule, TAKE 3 CAPSULES BY MOUTH EVERY MORNING, Disp: 270 capsule, Rfl: 3  Review of Systems  Constitutional: Negative for appetite change, chills and fever.  Respiratory: Negative for chest tightness, shortness of breath and wheezing.   Cardiovascular: Negative for chest pain and palpitations.  Gastrointestinal: Negative for abdominal pain, nausea and vomiting.    Social History   Tobacco Use  . Smoking status: Former Smoker    Types: Cigarettes    Quit date: 1974    Years since quitting: 47.1  . Smokeless tobacco: Never Used  . Tobacco comment: 1974  Substance Use Topics  . Alcohol use: Yes    Alcohol/week: 0.0 - 2.0 standard drinks      Objective:   There were no vitals taken for this visit. There were no vitals filed for this visit.There is no height or weight on file to calculate BMI.   Physical Exam   No  results found for any visits on 05/23/19.     Assessment & Plan        [name], MD  [facility]

## 2019-05-23 ENCOUNTER — Ambulatory Visit: Payer: Self-pay | Admitting: Family Medicine

## 2019-05-31 ENCOUNTER — Ambulatory Visit: Payer: Medicare Other

## 2019-05-31 ENCOUNTER — Other Ambulatory Visit: Payer: Self-pay

## 2019-05-31 ENCOUNTER — Ambulatory Visit: Payer: Self-pay

## 2019-05-31 ENCOUNTER — Encounter: Payer: Self-pay | Admitting: Emergency Medicine

## 2019-05-31 ENCOUNTER — Ambulatory Visit
Admission: EM | Admit: 2019-05-31 | Discharge: 2019-05-31 | Disposition: A | Discharge status: Home / Self Care | Payer: Medicare Other | Attending: Urgent Care | Admitting: Urgent Care

## 2019-05-31 DIAGNOSIS — R918 Other nonspecific abnormal finding of lung field: Secondary | ICD-10-CM | POA: Insufficient documentation

## 2019-05-31 DIAGNOSIS — Z8616 Personal history of COVID-19: Secondary | ICD-10-CM | POA: Insufficient documentation

## 2019-05-31 DIAGNOSIS — R911 Solitary pulmonary nodule: Secondary | ICD-10-CM | POA: Diagnosis present

## 2019-05-31 DIAGNOSIS — R059 Cough, unspecified: Secondary | ICD-10-CM

## 2019-05-31 DIAGNOSIS — R05 Cough: Secondary | ICD-10-CM | POA: Diagnosis present

## 2019-05-31 MED ORDER — BENZONATATE 100 MG PO CAPS: 100.0000 mg | ORAL_CAPSULE | Freq: Three times a day (TID) | ORAL | 0 refills | Status: DC

## 2019-05-31 MED ORDER — AZITHROMYCIN 250 MG PO TABS: 250.0000 mg | ORAL_TABLET | Freq: Every day | ORAL | 0 refills | Status: DC

## 2019-05-31 NOTE — Discharge Instructions (Signed)
It was very nice seeing you today in clinic. Thank you for entrusting me with your care.   Rest and make sure you are drinking plenty of fluids. Use medications as prescribed. May use Tylenol and/or Ibuprofen as needed for pain/fever.   Make arrangements to follow up with your regular doctor in 1 week for re-evaluation if not improving. If your symptoms/condition worsens, please seek follow up care either here or in the ER. Please remember, our [place] providers are "right here with you" when you need [place].   Again, it was my pleasure to take care of you today. Thank you for choosing our clinic. I hope that you start to feel better quickly.   Honor Loh, MSN, APRN, FNP-C, CEN Advanced Practice Provider [facility]

## 2019-05-31 NOTE — Telephone Encounter (Signed)
Called to schedule patient for telephone visit with one of our other providers, Geronimo.

## 2019-05-31 NOTE — ED Triage Notes (Signed)
Patient in today c/o cough, nasal congestion and body aches x 2 weeks.Patient states the body aches and cough worsened 3-4 days ago. Patient tested positive for covid on 05/19/19

## 2019-05-31 NOTE — Telephone Encounter (Signed)
Incoming call from Patient, with a complaint of having congestion of the chest with occasional shortness of breath.  Has a slight productive cough Temperature is 97.6.  Recommended that Patient go to Urgent Care or nearest ED. Patient states that he does not wish to go to ED nor Urgent Care.  Patient request, a prescription be called in.  Patient has been taking mucinex.  Would like to have something stronger.        Reason for Disposition . Wheezing is present  Answer Assessment - Initial Assessment Questions 1. ONSET: "When did the cough begin?"     Last Friday 2. SEVERITY: "How bad is the cough today?"      3. RESPIRATORY DISTRESS: "Describe your breathing."      *No Answer* 4. FEVER: "Do you have a fever?" If so, ask: "What is your temperature, how was it measured, and when did it start?"    " dont think so" 5. HEMOPTYSIS: "Are you coughing up any blood?" If so ask: "How much?" (flecks, streaks, tablespoons, etc.)   Denies 6. TREATMENT: "What have you done so far to treat the cough?" (e.g., meds, fluids, humidifier)    mucinex and Delsym 7. CARDIAC HISTORY: "Do you have any history of heart disease?" (e.g., heart attack, congestive heart failure)     Denies afb 8. LUNG HISTORY: "Do you have any history of lung disease?"  (e.g., pulmonary embolus, asthma, emphysema)     denies 9. PE RISK FACTORS: "Do you have a history of blood clots?" (or: recent major surgery, recent prolonged travel, bedridden)    denies 10. OTHER SYMPTOMS: "Do you have any other symptoms? (e.g., runny nose, wheezing, chest pain)      Whezzing 11. PREGNANCY: "Is there any chance you are pregnant?" "When was your last menstrual period?"      na 12. TRAVEL: "Have you traveled out of the country in the last month?" (e.g., travel history, exposures)      denies  Protocols used: COUGH - ACUTE NON-PRODUCTIVE-A-AH

## 2019-06-01 NOTE — Telephone Encounter (Signed)
Patient went to ED yesterday for this.

## 2019-06-02 NOTE — ED Provider Notes (Signed)
[place], [place]   Name: Jeffrey French DOB: January 12, 1951 MRN: [id number] CSN: [id number] PCP: Jerrol Banana., MD  Arrival date and time:  05/31/19 1454  Chief Complaint:  Cough, chest congestion, Generalized Body Aches, and covid positive   NOTE: Prior to seeing the patient today, I have reviewed the triage nursing documentation and vital signs. Clinical staff has updated patient's PMH/PSHx, current medication list, and drug allergies/intolerances to ensure comprehensive history available to assist in medical decision making.   History:   HPI: Jeffrey French is a 69 y.o. male who presents today with complaints of fatigue, cough, congestion, and generalized myalgias that started approximately 2 weeks ago. Symptoms have worsened over the course of the last 3-4 days. Patient has had low grade fevers; unable to report a Tmax. Cough has been non-productive with no associated shortness of breath or wheezing. He denies that he has experienced any nausea, vomiting, diarrhea, or abdominal pain. He is eating and drinking well. Patient denies any perceived alterations to his sense of taste or smell. Patient presents out of concerns for his personal health after being diagnosed with SARS-CoV-2 (novel coronavirus) on 05/19/2019. Patient reports that he has been doing fairly well until the last few days. He is concerned about possible pneumonia citing a PMH of the same. Patient denies any underlying lung disease; no asthma or COPD. He does have OSAH, however is unable to tolerate nocturnal PAP therapy. Despite his symptoms, patient has not taken any over the counter interventions to help improve/relieve his reported symptoms at home.     Past Medical History:  Diagnosis Date  . A-fib ([place])    resolved with ablation  . BPH (benign prostatic hyperplasia)   . GERD (gastroesophageal reflux disease)   . Hypertension   . Numbness of foot    Bilateral. since knee replacements  . Sleep apnea    moderate.   unable to tolerate CPAP    Past Surgical History:  Procedure Laterality Date  . ATRIAL FIBRILLATION ABLATION  02/26/2018   [facility]  . CATARACT EXTRACTION W/PHACO Left 11/08/2018   Procedure: CATARACT EXTRACTION PHACO AND INTRAOCULAR LENS PLACEMENT ([place]) LEFT;  Surgeon: Birder Robson, MD;  Location: [place];  Service: Ophthalmology;  Laterality: Left;  sleep apnea not a [place] per [place]  . CATARACT EXTRACTION W/PHACO Right 11/29/2018   Procedure: CATARACT EXTRACTION PHACO AND INTRAOCULAR LENS PLACEMENT (I[facility]) RIGHT  00:50.9  17.4%  8.88;  Surgeon: Birder Robson, MD;  Location: [facility];  Service: Ophthalmology;  Laterality: Right;  . ELECTROPHYSIOLOGIC STUDY N/A 10/09/2015   Procedure: Cardioversion;  Surgeon: Isaias Cowman, MD;  Location: [facility];  Service: Cardiovascular;  Laterality: N/A;  . NASAL RECONSTRUCTION WITH SEPTAL REPAIR    . REPLACEMENT TOTAL KNEE Right   . SHOULDER SURGERY Left   . TONSILLECTOMY      Family History  Problem Relation Age of Onset  . Arthritis Mother   . Hypertension Mother   . Colon cancer Mother   . Other Mother        lymph node  . Diabetes Father   . Congestive Heart Failure Father   . Arthritis Sister     Social History   Tobacco Use  . Smoking status: Former Smoker    Types: Cigarettes    Quit date: 1974    Years since quitting: 47.1  . Smokeless tobacco: Never Used  . Tobacco comment: 1974  Substance Use Topics  . Alcohol use: Yes  Alcohol/week: 0.0 - 2.0 standard drinks  . Drug use: No    Patient Active Problem List   Diagnosis Date Noted  . OSA (obstructive sleep apnea) 05/03/2018  . Acute on chronic systolic CHF (congestive heart failure) ([place]) 02/28/2018  . On amiodarone therapy 02/27/2018  . History of depression 02/26/2018  . Status post ablation of atrial fibrillation 02/17/2018  . Atrial fibrillation ([place]) 01/05/2018  . Heart palpitations 10/21/2017  . Diffuse cerebral atrophy ([place])  03/20/2017  . SOB (shortness of breath) on exertion 03/19/2017  . New onset atrial flutter ([place]) 09/18/2015  . Irregular heart rhythm 09/17/2015  . Absolute anemia 02/18/2015  . Benign fibroma of prostate 02/18/2015  . Clinical depression 02/18/2015  . Abnormal prostate specific antigen 02/18/2015  . Essential (primary) hypertension 02/18/2015  . Anxiety, generalized 02/18/2015  . Insomnia 02/18/2015  . Arthritis, degenerative 02/18/2015  . Peptic ulcer 02/18/2015  . Elevated prostate specific antigen (PSA) 10/19/2013    Home Medications:    Current Meds  Medication Sig  . alprazolam (XANAX) 2 MG tablet Take 0.5 tablets (1 mg total) by mouth as needed.  Marland Kitchen amLODipine (NORVASC) 5 MG tablet Take by mouth.  Marland Kitchen apixaban (ELIQUIS) 5 MG TABS tablet Take 5 mg by mouth 2 (two) times daily.  Marland Kitchen buPROPion (WELLBUTRIN XL) 300 MG 24 hr tablet TAKE 1 TABLET BY MOUTH EVERY DAY.  Marland Kitchen Cholecalciferol (VITAMIN D) 2000 UNITS CAPS Take 1 capsule by mouth daily.   . clindamycin (CLEOCIN) 300 MG capsule clindamycin HCl 300 mg capsule  TAKE 2 CAPSULES ONE HOUR PRIOR TO DENTAL APPOINTMENT  . finasteride (PROSCAR) 5 MG tablet TAKE 1 TABLET BY MOUTH DAILY  . folic acid (FOLVITE) Q000111Q MCG tablet Take 800 mcg by mouth daily.   . furosemide (LASIX) 20 MG tablet TAKE 1 TABLET (20 MG TOTAL) BY MOUTH ONCE DAILY. TAKE AN ADDITIONAL TABLET ASNEEDED FOR WEIGHT GAIN GREATER THAN 2 POUNDS A NIGHT.  Marland Kitchen HYDROcodone-acetaminophen (NORCO/VICODIN) 5-325 MG tablet Take 1 tablet by mouth daily.  Marland Kitchen lisinopril (PRINIVIL,ZESTRIL) 5 MG tablet Take by mouth.  . Melatonin 1 MG TABS Take 2 mg by mouth at bedtime.   . metaxalone (SKELAXIN) 800 MG tablet Take 800 mg by mouth daily.  . metoprolol succinate (TOPROL-XL) 50 MG 24 hr tablet TAKE 1 TABLET BY MOUTH DAILY.  Marland Kitchen omeprazole (PRILOSEC) 20 MG capsule TAKE 1 CAPSULE BY MOUTH EVERY DAY.  . tamsulosin (FLOMAX) 0.4 MG CAPS capsule TAKE 1 CAPSULE BY MOUTH DAILY  . temazepam (RESTORIL) 30  MG capsule TAKE 1 CAPSULE BY MOUTH EVERY DAY FOR SLEEP  . venlafaxine XR (EFFEXOR-XR) 75 MG 24 hr capsule TAKE 3 CAPSULES BY MOUTH EVERY MORNING    Allergies:   Sulfamethoxazole-trimethoprim  Review of Systems (ROS):  Review of systems NEGATIVE unless otherwise noted in narrative H&P section.   Vital Signs: Today's Vitals   05/31/19 1512 05/31/19 1513 05/31/19 1801  BP:  108/79   Pulse:  86   Resp:  (!) 24   Temp:  98.3 F (36.8 C)   TempSrc:  Oral   SpO2:  98%   Weight:  230 lb (104.3 kg)   Height:  6\' 3"  (1.905 m)   PainSc: 3   3     Physical Exam: Physical Exam  Constitutional: He is oriented to person, place, and time and well-developed, well-nourished, and in no distress.  HENT:  Head: Normocephalic and atraumatic.  Nose: Nose normal.  Mouth/Throat: Uvula is midline, oropharynx is clear and moist  and mucous membranes are normal.  Eyes: Pupils are equal, round, and reactive to light.  Neck: No tracheal deviation present.  Cardiovascular: Normal rate, regular rhythm, normal heart sounds and intact distal pulses.  Pulmonary/Chest: Effort normal. Tachypnea noted. He has decreased breath sounds in the right lower field and the left lower field. He has no wheezes. He has no rhonchi.  Moderate cough noted in clinic. No SOB or accessory muscle use. No distress. Able to speak in complete sentences without difficulties. SPO2 98% on RA.  Abdominal: Soft. Bowel sounds are normal. He exhibits no distension. There is no abdominal tenderness.  Musculoskeletal:     Cervical back: Normal range of motion and neck supple.  Lymphadenopathy:    He has no cervical adenopathy.  Neurological: He is alert and oriented to person, place, and time. Gait normal.  Skin: Skin is warm and dry. No rash noted. He is not diaphoretic.  Psychiatric: Mood, memory, affect and judgment normal.  Nursing note and vitals reviewed.   Urgent Care Treatments / Results:   Orders Placed This Encounter    Procedures  . DG Chest 2 View  . CT Chest Wo Contrast    LABS: PLEASE NOTE: all labs that were ordered this encounter are listed, however only abnormal results are displayed. Labs Reviewed - No data to display  EKG: -None  RADIOLOGY: DG Chest 2 View  Addendum Date: 05/31/2019   ADDENDUM REPORT: 05/31/2019 17:40 ADDENDUM: A second PA film has now been provided. Lung apices are included on this additional film show no evidence for pneumothorax. Nodular opacity at the right base again noted. Electronically Signed   By: Misty Stanley M.D.   On: 05/31/2019 17:40   Result Date: 05/31/2019 CLINICAL DATA:  Cough with pleuritic chest pain. COVID 19 positive EXAM: CHEST - 2 VIEW COMPARISON:  01/05/2018. FINDINGS: Lung apices have not been included on the PA film. Visualized lungs show no focal consolidation small nodular opacity seen at the right base, projecting between the anterior fifth and sixth ribs. The cardiopericardial silhouette is within normal limits for size. The visualized bony structures of the thorax are intact. IMPRESSION: 1. Tiny nodular opacity at the right base. CT chest without contrast recommended to further evaluate. 2. No focal consolidation or pulmonary edema. Electronically Signed: By: Misty Stanley M.D. On: 05/31/2019 16:21   CT Chest Wo Contrast  Result Date: 05/31/2019 CLINICAL DATA:  Abnormal x-ray. Lung opacity and cough. EXAM: CT CHEST WITHOUT CONTRAST TECHNIQUE: Multidetector CT imaging of the chest was performed following the standard protocol without IV contrast. COMPARISON:  X-ray from the same day. FINDINGS: Cardiovascular: Mild aortic calcifications are noted without evidence for thoracic aortic aneurysm. Coronary artery calcifications are noted. The heart size is normal. There is no significant pericardial effusion. Mediastinum/Nodes: --No mediastinal or hilar lymphadenopathy. --No axillary lymphadenopathy. --No supraclavicular lymphadenopathy. --Normal thyroid  gland. --The esophagus is unremarkable Lungs/Pleura: There are few scattered ground-glass airspace opacities bilaterally, most evident in the left lower lobe (axial series 3, image 74). There is no pneumothorax or large pleural effusion. There is a 6 mm pulmonary nodule in the peripheral right lower lobe (axial series 3, image 101). This corresponds well to the finding seen on patient's recent chest x-ray. Upper Abdomen: No acute abnormality. Musculoskeletal: No chest wall abnormality. No acute or significant osseous findings. IMPRESSION: 1. There are few scattered ground-glass airspace opacities bilaterally, most evident in the left lower lobe, which are consistent with the patient's history of COVID-19 pneumonia. 2.  A 6 mm pulmonary nodule in the peripheral right lower lobe, which corresponds well to the finding seen on patient's recent chest x-ray. Non-contrast chest CT at 6-12 months is recommended. If the nodule is stable at time of repeat CT, then future CT at 18-24 months (from today's scan) is considered optional for low-risk patients, but is recommended for high-risk patients. This recommendation follows the consensus statement: Guidelines for Management of Incidental Pulmonary Nodules Detected on CT Images: From the Fleischner Society 2017; Radiology 2017; 284:228-243. 3. Aortic Atherosclerosis (ICD10-I70.0). Electronically Signed   By: Constance Holster M.D.   On: 05/31/2019 17:41    PROCEDURES: Procedures  MEDICATIONS RECEIVED THIS VISIT: Medications - No data to display  PERTINENT CLINICAL COURSE NOTES/UPDATES:   Initial Impression / Assessment and Plan / Urgent Care Course:  Pertinent labs & imaging results that were available during my care of the patient were personally reviewed by me and considered in my medical decision making (see lab/imaging section of note for values and interpretations).  Jeffrey French is a 69 y.o. male who presents to [facility] today with complaints of  Cough, chest congestion, Generalized Body Aches, and covid positive  Patient overall well appearing and in no acute distress today in clinic. Presenting symptoms (see HPI) and exam as documented above. Patient was diagnosed with SARS-CoV-2 (novel coronavirus) on 05/18/2010. He reports feeling worse over the last few days. PMH (+) for pneumonia per his report. Will proceed with further evaluation:   Radiographs of the chest performed today revealed no acute no evidence of peribronchial thickening or focal areas of consolidation. There was mention on a nodular opacity in the RLL, with recommendations for CT imaging to further investigate.   Findings reviewed with patient. Discussed DDx. Patient concerned that he will not be able to get in with PCP due to his SARS-CoV-2 diagnosis and therefore wishes to proceed with the recommended CT imaging today. Given his history, worsening cough, and abnormal finding on diagnostic plain films, this is felt to be a reasonable approach. CT chest without contrast ordered.    CT imaging of the chest performed; results reviewed.   Bibasilar ground glass opacities noted (L>R) consistent with recent SARS-CoV-2 pneumonia.   Nodular density seen on radiographs further characterized as a 6 mm pulmonary nodule.   Recommendations were made for repeat imaging in 6-12 months and 18-24 months (from today's imaging) to assess for stability.   Findings today reviewed with patient in detail. Patient is 2 weeks out from his SARS-CoV-2 diagnosis. Unsure if he developed SARS-CoV-2 pneumonia initially, or it is just now developing. Given acute symptom exacerbation over the last 3-4 days, will proceed with treatment using macrolide therapy (azithromycin) for cough and respiratory infection. Discussed supportive care measures at home during acute phase of illness. Patient to rest as much as possible. He was encouraged to ensure adequate hydration (water and ORS) to prevent dehydration  and electrolyte derangements. Patient may use APAP and/or IBU on an as needed basis for pain/fever. Will send in a supply of benzonatate (Tessalon) for patient to use on a PRN basis to help with his cough.   Discussed follow up with primary care physician in 1 week for re-evaluation. I have reviewed the follow up and strict return precautions for any new or worsening symptoms. Patient is aware of symptoms that would be deemed urgent/emergent, and would thus require further evaluation either here or in the emergency department. At the time of discharge, he verbalized understanding and  consent with the discharge plan as it was reviewed with him. All questions were fielded by provider and/or clinic staff prior to patient discharge.    Final Clinical Impressions / Urgent Care Diagnoses:   Final diagnoses:  Ground glass opacity present on imaging of lung  Cough  History of 2019 novel coronavirus disease (COVID-19)  Solitary pulmonary nodule    New Prescriptions:  [place] Controlled Substance Registry consulted? Not Applicable  Meds ordered this encounter  Medications  . benzonatate (TESSALON) 100 MG capsule    Sig: Take 1 capsule (100 mg total) by mouth every 8 (eight) hours.    Dispense:  21 capsule    Refill:  0  . azithromycin (ZITHROMAX) 250 MG tablet    Sig: Take 1 tablet (250 mg total) by mouth daily. Take first 2 tablets together, then 1 every day until finished.    Dispense:  6 tablet    Refill:  0    Dx: [id number]    Recommended Follow up Care:  Patient encouraged to follow up with the following provider within the specified time frame, or sooner as dictated by the severity of his symptoms. As always, he was instructed that for any urgent/emergent care needs, he should seek care either here or in the emergency department for more immediate evaluation.  Follow-up Information    Jerrol Banana., MD In 1 week.   Specialty: Family Medicine Why: General reassessment of symptoms if not  improving Contact information: [address] [place] [address] [phone]         NOTE: This note was prepared using Dragon dictation software along with smaller phrase technology. Despite my best ability to proofread, there is the potential that transcriptional errors may still occur from this process, and are completely unintentional.     Karen Kitchens, NP 06/02/19 1035

## 2019-06-19 NOTE — Progress Notes (Signed)
Patient: Jeffrey French Male    DOB: November 13, 1950   69 y.o.   MRN: [id number] Visit Date: 06/20/2019  Today's Provider: [name], MD   Chief Complaint  Patient presents with  . Follow-up  . Hypertension   Subjective:     HPI  Patient comes in today for follow-up.  He was follow-up for hypertension but in review of his chart he had Covid back in December and had a Covid evaluation at [facility] urgent care at [facility].  At that time he was found to have Covid type pneumonia with groundglass findings on CT scan but also pulmonary nodule that needs 6 to 74-month follow-up.  All this is clinically resolved and is feeling well.  Also reviewed with Dr. Trena Platt note from neurology visit in November. Unsteady gait From 01/18/2019-Recommend PT referral. Has f/u with neurology.  Essential (primary) hypertension From 01/18/2019-Controlled norvasc and metoprolol.  MDD  From 01/18/2019-In partial remission.   Allergies  Allergen Reactions  . Sulfamethoxazole-Trimethoprim Rash    (in mouth)     Current Outpatient Medications:  .  alprazolam (XANAX) 2 MG tablet, Take 0.5 tablets (1 mg total) by mouth as needed., Disp: 30 tablet, Rfl: 0 .  amLODipine (NORVASC) 5 MG tablet, Take by mouth., Disp: , Rfl:  .  apixaban (ELIQUIS) 5 MG TABS tablet, Take 5 mg by mouth 2 (two) times daily., Disp: , Rfl:  .  buPROPion (WELLBUTRIN XL) 300 MG 24 hr tablet, TAKE 1 TABLET BY MOUTH EVERY DAY., Disp: 90 tablet, Rfl: 3 .  Cholecalciferol (VITAMIN D) 2000 UNITS CAPS, Take 1 capsule by mouth daily. , Disp: , Rfl:  .  clindamycin (CLEOCIN) 300 MG capsule, clindamycin HCl 300 mg capsule  TAKE 2 CAPSULES ONE HOUR PRIOR TO DENTAL APPOINTMENT, Disp: , Rfl:  .  finasteride (PROSCAR) 5 MG tablet, TAKE 1 TABLET BY MOUTH DAILY, Disp: 30 tablet, Rfl: 11 .  folic acid (FOLVITE) Q000111Q MCG tablet, Take 800 mcg by mouth daily. , Disp: , Rfl:  .  furosemide (LASIX) 20 MG tablet, TAKE 1 TABLET (20 MG TOTAL) BY MOUTH  ONCE DAILY. TAKE AN ADDITIONAL TABLET ASNEEDED FOR WEIGHT GAIN GREATER THAN 2 POUNDS A NIGHT., Disp: , Rfl:  .  HYDROcodone-acetaminophen (NORCO/VICODIN) 5-325 MG tablet, Take 1 tablet by mouth daily., Disp: , Rfl:  .  Melatonin 1 MG TABS, Take 2 mg by mouth at bedtime. , Disp: , Rfl:  .  metoprolol succinate (TOPROL-XL) 50 MG 24 hr tablet, TAKE 1 TABLET BY MOUTH DAILY., Disp: 90 tablet, Rfl: 3 .  omeprazole (PRILOSEC) 20 MG capsule, TAKE 1 CAPSULE BY MOUTH EVERY DAY., Disp: 90 capsule, Rfl: 3 .  tamsulosin (FLOMAX) 0.4 MG CAPS capsule, TAKE 1 CAPSULE BY MOUTH DAILY, Disp: 30 capsule, Rfl: 11 .  temazepam (RESTORIL) 30 MG capsule, TAKE 1 CAPSULE BY MOUTH EVERY DAY FOR SLEEP, Disp: 30 capsule, Rfl: 5 .  venlafaxine XR (EFFEXOR-XR) 75 MG 24 hr capsule, TAKE 3 CAPSULES BY MOUTH EVERY MORNING, Disp: 270 capsule, Rfl: 3 .  azithromycin (ZITHROMAX) 250 MG tablet, Take 1 tablet (250 mg total) by mouth daily. Take first 2 tablets together, then 1 every day until finished. (Patient not taking: Reported on 06/20/2019), Disp: 6 tablet, Rfl: 0 .  benzonatate (TESSALON) 100 MG capsule, Take 1 capsule (100 mg total) by mouth every 8 (eight) hours., Disp: 21 capsule, Rfl: 0 .  lisinopril (PRINIVIL,ZESTRIL) 5 MG tablet, Take by mouth., Disp: , Rfl:  .  metaxalone (SKELAXIN) 800 MG tablet, Take 800 mg by mouth daily., Disp: , Rfl:   Review of Systems  Constitutional: Negative for appetite change, chills and fever.  HENT: Negative.   Eyes: Negative.   Respiratory: Negative for chest tightness, shortness of breath and wheezing.   Cardiovascular: Negative for chest pain and palpitations.  Gastrointestinal: Negative for abdominal pain, nausea and vomiting.  Musculoskeletal: Positive for gait problem.  Skin: Negative.   Allergic/Immunologic: Negative.   Neurological: Positive for weakness.  Hematological: Negative.   Psychiatric/Behavioral: Negative.     Social History   Tobacco Use  . Smoking status: Former  Smoker    Types: Cigarettes    Quit date: 1974    Years since quitting: 47.2  . Smokeless tobacco: Never Used  . Tobacco comment: 1974  Substance Use Topics  . Alcohol use: Yes    Alcohol/week: 0.0 - 2.0 standard drinks      Objective:   BP 130/74 (BP Location: Right Arm, Patient Position: Sitting, Cuff Size: Large)   Pulse 80   Temp (!) 96.8 F (36 C) (Other (Comment))   Resp 16   Ht 6\' 3"  (1.905 m)   Wt 230 lb (104.3 kg)   SpO2 97%   BMI 28.75 kg/m  Vitals:   06/20/19 1549  BP: 130/74  Pulse: 80  Resp: 16  Temp: (!) 96.8 F (36 C)  TempSrc: Other (Comment)  SpO2: 97%  Weight: 230 lb (104.3 kg)  Height: 6\' 3"  (1.905 m)  Body mass index is 28.75 kg/m.   Physical Exam Vitals reviewed.  Constitutional:      Appearance: He is well-developed.  HENT:     Head: Normocephalic and atraumatic.     Right Ear: External ear normal.     Left Ear: External ear normal.  Eyes:     General: No scleral icterus.    Conjunctiva/sclera: Conjunctivae normal.  Neck:     Thyroid: No thyromegaly.  Cardiovascular:     Rate and Rhythm: Normal rate and regular rhythm.     Heart sounds: Normal heart sounds.  Pulmonary:     Effort: Pulmonary effort is normal.     Breath sounds: Normal breath sounds.  Abdominal:     Palpations: Abdomen is soft.  Skin:    General: Skin is warm and dry.  Neurological:     Mental Status: He is alert and oriented to person, place, and time. Mental status is at baseline.     Comments: Mild ataxic gait. I think his thinking is clearer than it was on the last visit.  Psychiatric:        Mood and Affect: Mood normal.        Behavior: Behavior normal.        Thought Content: Thought content normal.        Judgment: Judgment normal.      No results found for any visits on 06/20/19.     Assessment & Plan    1. Essential (primary) hypertension Controlled on amlodipine and metoprolol - CBC with Differential/Platelet - Comprehensive metabolic  panel - Lipid panel - TSH  2. History of depression Controlled with partial remission on venlafaxine - CBC with Differential/Platelet - Comprehensive metabolic panel - Lipid panel - TSH  3. Anxiety, generalized Presently stable - CBC with Differential/Platelet - Comprehensive metabolic panel - Lipid panel - TSH  4. Chronic atrial fibrillation (HCC) On Eliquis followed by Dr. Saralyn Pilar  5. OSA (obstructive sleep apnea) Patient declines CPAP says he  cannot tolerate it.  6. Diffuse cerebral atrophy ([place])   7. MCI (mild cognitive impairment) Clinically stable and may be improved from last visit.  Consider Aricept in the future.  8. Recurrent major depressive disorder, in partial remission ([place])   9. Anemia, unspecified type  - CBC with Differential/Platelet - Comprehensive metabolic panel - Lipid panel - TSH  10. Pulmonary nodule Needs follow-up CT the last half of [place], MD  [facility]

## 2019-06-20 ENCOUNTER — Other Ambulatory Visit: Payer: Self-pay

## 2019-06-20 ENCOUNTER — Ambulatory Visit (INDEPENDENT_AMBULATORY_CARE_PROVIDER_SITE_OTHER): Payer: Medicare Other | Admitting: Family Medicine

## 2019-06-20 ENCOUNTER — Encounter: Payer: Self-pay | Admitting: Family Medicine

## 2019-06-20 VITALS — BP 130/74 | HR 80 | Temp 96.8°F | Resp 16 | Ht 75.0 in | Wt 230.0 lb

## 2019-06-20 DIAGNOSIS — G3184 Mild cognitive impairment, so stated: Secondary | ICD-10-CM

## 2019-06-20 DIAGNOSIS — Z8659 Personal history of other mental and behavioral disorders: Secondary | ICD-10-CM

## 2019-06-20 DIAGNOSIS — I10 Essential (primary) hypertension: Secondary | ICD-10-CM | POA: Diagnosis not present

## 2019-06-20 DIAGNOSIS — I482 Chronic atrial fibrillation, unspecified: Secondary | ICD-10-CM

## 2019-06-20 DIAGNOSIS — D649 Anemia, unspecified: Secondary | ICD-10-CM

## 2019-06-20 DIAGNOSIS — G319 Degenerative disease of nervous system, unspecified: Secondary | ICD-10-CM

## 2019-06-20 DIAGNOSIS — R911 Solitary pulmonary nodule: Secondary | ICD-10-CM

## 2019-06-20 DIAGNOSIS — F411 Generalized anxiety disorder: Secondary | ICD-10-CM | POA: Diagnosis not present

## 2019-06-20 DIAGNOSIS — F3341 Major depressive disorder, recurrent, in partial remission: Secondary | ICD-10-CM

## 2019-06-20 DIAGNOSIS — G4733 Obstructive sleep apnea (adult) (pediatric): Secondary | ICD-10-CM

## 2019-07-13 ENCOUNTER — Telehealth: Payer: Self-pay | Admitting: *Deleted

## 2019-07-13 ENCOUNTER — Other Ambulatory Visit
Admission: RE | Admit: 2019-07-13 | Discharge: 2019-07-13 | Disposition: A | Discharge status: Home / Self Care | Payer: Medicare Other | Attending: Family Medicine | Admitting: Family Medicine

## 2019-07-13 DIAGNOSIS — Z8659 Personal history of other mental and behavioral disorders: Secondary | ICD-10-CM | POA: Diagnosis not present

## 2019-07-13 DIAGNOSIS — F411 Generalized anxiety disorder: Secondary | ICD-10-CM | POA: Diagnosis not present

## 2019-07-13 DIAGNOSIS — I10 Essential (primary) hypertension: Secondary | ICD-10-CM | POA: Diagnosis present

## 2019-07-13 DIAGNOSIS — D649 Anemia, unspecified: Secondary | ICD-10-CM | POA: Diagnosis not present

## 2019-07-13 LAB — CBC WITH DIFFERENTIAL/PLATELET
Abs Immature Granulocytes: 0.02 10*3/uL (ref 0.00–0.07)
Basophils Absolute: 0.1 10*3/uL (ref 0.0–0.1)
Basophils Relative: 2 %
Eosinophils Absolute: 0.8 10*3/uL — ABNORMAL HIGH (ref 0.0–0.5)
Eosinophils Relative: 11 %
HCT: 41.4 % (ref 39.0–52.0)
Hemoglobin: 13.4 g/dL (ref 13.0–17.0)
Immature Granulocytes: 0 %
Lymphocytes Relative: 30 %
Lymphs Abs: 2.3 10*3/uL (ref 0.7–4.0)
MCH: 28.9 pg (ref 26.0–34.0)
MCHC: 32.4 g/dL (ref 30.0–36.0)
MCV: 89.2 fL (ref 80.0–100.0)
Monocytes Absolute: 0.5 10*3/uL (ref 0.1–1.0)
Monocytes Relative: 7 %
Neutro Abs: 3.8 10*3/uL (ref 1.7–7.7)
Neutrophils Relative %: 50 %
Platelets: 279 10*3/uL (ref 150–400)
RBC: 4.64 MIL/uL (ref 4.22–5.81)
RDW: 14.2 % (ref 11.5–15.5)
WBC: 7.5 10*3/uL (ref 4.0–10.5)
nRBC: 0 % (ref 0.0–0.2)

## 2019-07-13 LAB — LIPID PANEL
Cholesterol: 226 mg/dL — ABNORMAL HIGH (ref 0–200)
HDL: 58 mg/dL (ref >40)
LDL Cholesterol: 141 mg/dL — ABNORMAL HIGH (ref 0–99)
Total CHOL/HDL Ratio: 3.9 RATIO
Triglycerides: 133 mg/dL (ref <150)
VLDL: 27 mg/dL (ref 0–40)

## 2019-07-13 LAB — COMPREHENSIVE METABOLIC PANEL
ALT: 26 U/L (ref 0–44)
AST: 28 U/L (ref 15–41)
Albumin: 3.9 g/dL (ref 3.5–5.0)
Alkaline Phosphatase: 66 U/L (ref 38–126)
Anion gap: 7 (ref 5–15)
BUN: 22 mg/dL (ref 8–23)
CO2: 28 mmol/L (ref 22–32)
Calcium: 9 mg/dL (ref 8.9–10.3)
Chloride: 105 mmol/L (ref 98–111)
Creatinine, Ser: 1.3 mg/dL — ABNORMAL HIGH (ref 0.61–1.24)
GFR calc Af Amer: >60 mL/min (ref >60)
GFR calc non Af Amer: 55 mL/min — ABNORMAL LOW (ref >60)
Glucose, Bld: 105 mg/dL — ABNORMAL HIGH (ref 70–99)
Potassium: 4.6 mmol/L (ref 3.5–5.1)
Sodium: 140 mmol/L (ref 135–145)
Total Bilirubin: 1.1 mg/dL (ref 0.3–1.2)
Total Protein: 7.3 g/dL (ref 6.5–8.1)

## 2019-07-13 LAB — TSH: TSH: 1.273 u[IU]/mL (ref 0.350–4.500)

## 2019-07-13 NOTE — Telephone Encounter (Signed)
-----   Message from Jerrol Banana., MD sent at 07/13/2019 10:58 AM EDT ----- Lipids and TSH stable.

## 2019-07-13 NOTE — Telephone Encounter (Signed)
Tried calling pt with results. No answer and no vm. Will try again later. Okay for [facility] triage to give patient results.

## 2019-07-17 ENCOUNTER — Other Ambulatory Visit: Payer: Self-pay | Admitting: Family Medicine

## 2019-07-17 DIAGNOSIS — F32A Depression, unspecified: Secondary | ICD-10-CM

## 2019-07-17 DIAGNOSIS — F329 Major depressive disorder, single episode, unspecified: Secondary | ICD-10-CM

## 2019-07-17 NOTE — Telephone Encounter (Signed)
Requested Prescriptions  Pending Prescriptions Disp Refills  . buPROPion (WELLBUTRIN XL) 300 MG 24 hr tablet [Pharmacy Med Name: BUPROPION HCL ER (XL) 300 MG TAB] 90 tablet 1    Sig: TAKE 1 TABLET BY MOUTH DAILY     Psychiatry: Antidepressants - bupropion Failed - 07/17/2019 11:53 AM      Failed - Completed PHQ-2 or PHQ-9 in the last 360 days.      Passed - Last BP in normal range    BP Readings from Last 1 Encounters:  06/20/19 130/74         Passed - Valid encounter within last 6 months    Recent Outpatient Visits          3 weeks ago Essential (primary) hypertension   [facility] Jerrol Banana., MD   6 months ago Unsteady gait   [facility] Jerrol Banana., MD   7 months ago Dizziness   [facility] Jerrol Banana., MD   11 months ago Essential (primary) hypertension   [facility] Jerrol Banana., MD   1 year ago Colon cancer screening   [facility] Jerrol Banana., MD             '

## 2019-07-18 NOTE — Telephone Encounter (Signed)
Will mail patient letter. KW

## 2019-07-25 ENCOUNTER — Other Ambulatory Visit: Payer: Self-pay | Admitting: Family Medicine

## 2019-07-25 NOTE — Telephone Encounter (Signed)
Requested medication (s) are due for refill today: yes  Requested medication (s) are on the active medication list: yes  Last refill:  05/11/2018  Future visit scheduled: yes  Notes to clinic:  This refill cannot be delegated  Requested Prescriptions  Pending Prescriptions Disp Refills   temazepam (RESTORIL) 30 MG capsule [Pharmacy Med Name: TEMAZEPAM 30 MG CAP] 30 capsule     Sig: TAKE 1 CAPSULE BY MOUTH EVERY DAY FOR SLEEP      Not Delegated - Psychiatry:  Anxiolytics/Hypnotics Failed - 07/25/2019 10:19 AM      Failed - This refill cannot be delegated      Failed - Urine Drug Screen completed in last 360 days.      Passed - Valid encounter within last 6 months    Recent Outpatient Visits           1 month ago Essential (primary) hypertension   [facility] Jerrol Banana., MD   6 months ago Unsteady gait   [facility] Jerrol Banana., MD   7 months ago Dizziness   [facility] Jerrol Banana., MD   11 months ago Essential (primary) hypertension   [facility] Jerrol Banana., MD   1 year ago Colon cancer screening   [facility] Jerrol Banana., MD

## 2019-09-05 ENCOUNTER — Ambulatory Visit
Admission: EM | Admit: 2019-09-05 | Discharge: 2019-09-05 | Disposition: A | Discharge status: Home / Self Care | Payer: Medicare Other | Attending: Family Medicine | Admitting: Family Medicine

## 2019-09-05 ENCOUNTER — Ambulatory Visit (INDEPENDENT_AMBULATORY_CARE_PROVIDER_SITE_OTHER): Payer: Medicare Other

## 2019-09-05 ENCOUNTER — Encounter: Payer: Self-pay | Admitting: Emergency Medicine

## 2019-09-05 ENCOUNTER — Other Ambulatory Visit: Payer: Self-pay

## 2019-09-05 DIAGNOSIS — J209 Acute bronchitis, unspecified: Secondary | ICD-10-CM | POA: Diagnosis not present

## 2019-09-05 DIAGNOSIS — S0101XA Laceration without foreign body of scalp, initial encounter: Secondary | ICD-10-CM | POA: Diagnosis not present

## 2019-09-05 MED ORDER — DOXYCYCLINE HYCLATE 100 MG PO CAPS: 100.0000 mg | ORAL_CAPSULE | Freq: Two times a day (BID) | ORAL | 0 refills | Status: DC

## 2019-09-05 MED ORDER — HYDROCODONE-HOMATROPINE 5-1.5 MG/5ML PO SYRP: 5.0000 mL | ORAL_SOLUTION | Freq: Four times a day (QID) | ORAL | 0 refills | Status: DC | PRN

## 2019-09-05 NOTE — ED Triage Notes (Signed)
Pt c/o productive cough, chest congestion. Started about a week and a half ago. Denies fever. He also states he has a head laceration on the back, left of his head. Occurred about 11 am this morning. He states a door fell and hit him in the head and the screw on the end hit his head.  Last tetanus was 01/13/2018. Pt declines covid testing, states he had covid back in February and states he has the antibodies.

## 2019-09-05 NOTE — ED Provider Notes (Addendum)
[facility]    CSN: [id number] Arrival date & time: 09/05/19  1214      History   Chief Complaint Chief Complaint  Patient presents with  . Cough  . Head Laceration   HPI  69 year old male presents with the above complaints.  Patient reports a 1.5-week history of cough and chest congestion.  Worse at night.  Interfering with sleep.  No reports of fever or shortness of breath.  Additionally, patient reports that he was hit in the back of the head with a door and his head was struck by drywall screw.  He has a puncture wound/laceration to the back of his head.  Mild bleeding.  Bleeding is well controlled at this time.  Tetanus up-to-date.  No other associated symptoms.  No other complaints.  Past Medical History:  Diagnosis Date  . A-fib ([place])    resolved with ablation  . BPH (benign prostatic hyperplasia)   . GERD (gastroesophageal reflux disease)   . Hypertension   . Numbness of foot    Bilateral. since knee replacements  . Sleep apnea    moderate.  unable to tolerate CPAP    Patient Active Problem List   Diagnosis Date Noted  . OSA (obstructive sleep apnea) 05/03/2018  . Acute on chronic systolic CHF (congestive heart failure) ([place]) 02/28/2018  . On amiodarone therapy 02/27/2018  . History of depression 02/26/2018  . Status post ablation of atrial fibrillation 02/17/2018  . Atrial fibrillation ([place]) 01/05/2018  . Heart palpitations 10/21/2017  . Diffuse cerebral atrophy ([place]) 03/20/2017  . SOB (shortness of breath) on exertion 03/19/2017  . New onset atrial flutter ([place]) 09/18/2015  . Irregular heart rhythm 09/17/2015  . Absolute anemia 02/18/2015  . Benign fibroma of prostate 02/18/2015  . Clinical depression 02/18/2015  . Abnormal prostate specific antigen 02/18/2015  . Essential (primary) hypertension 02/18/2015  . Anxiety, generalized 02/18/2015  . Insomnia 02/18/2015  . Arthritis, degenerative 02/18/2015  . Peptic ulcer 02/18/2015  . Elevated  prostate specific antigen (PSA) 10/19/2013    Past Surgical History:  Procedure Laterality Date  . ATRIAL FIBRILLATION ABLATION  02/26/2018   [facility]  . CATARACT EXTRACTION W/PHACO Left 11/08/2018   Procedure: CATARACT EXTRACTION PHACO AND INTRAOCULAR LENS PLACEMENT ([place]) LEFT;  Surgeon: Birder Robson, MD;  Location: [facility];  Service: Ophthalmology;  Laterality: Left;  sleep apnea not a Toric per [place]  . CATARACT EXTRACTION W/PHACO Right 11/29/2018   Procedure: CATARACT EXTRACTION PHACO AND INTRAOCULAR LENS PLACEMENT (IOC) RIGHT  00:50.9  17.4%  8.88;  Surgeon: Birder Robson, MD;  Location: [facility];  Service: Ophthalmology;  Laterality: Right;  . ELECTROPHYSIOLOGIC STUDY N/A 10/09/2015   Procedure: Cardioversion;  Surgeon: Isaias Cowman, MD;  Location: [facility];  Service: Cardiovascular;  Laterality: N/A;  . NASAL RECONSTRUCTION WITH SEPTAL REPAIR    . REPLACEMENT TOTAL KNEE Right   . SHOULDER SURGERY Left   . TONSILLECTOMY         Home Medications    Prior to Admission medications   Medication Sig Start Date End Date Taking? Authorizing Provider  alprazolam Duanne Moron) 2 MG tablet Take 0.5 tablets (1 mg total) by mouth as needed. 09/30/17  Yes Jerrol Banana., MD  amLODipine (NORVASC) 5 MG tablet Take by mouth. 10/02/13  Yes [provider]  apixaban (ELIQUIS) 5 MG TABS tablet Take 5 mg by mouth 2 (two) times daily.   Yes [provider]  buPROPion (WELLBUTRIN XL) 300 MG 24 hr tablet  TAKE 1 TABLET BY MOUTH DAILY 07/17/19  Yes Jerrol Banana., MD  Cholecalciferol (VITAMIN D) 2000 UNITS CAPS Take 1 capsule by mouth daily.  02/10/11  Yes [provider]  finasteride (PROSCAR) 5 MG tablet TAKE 1 TABLET BY MOUTH DAILY 12/07/18  Yes Jerrol Banana., MD  folic acid (FOLVITE) Q000111Q MCG tablet Take 800 mcg by mouth daily.  02/10/11  Yes [provider]  furosemide (LASIX) 20 MG tablet TAKE 1 TABLET (20 MG TOTAL) BY  MOUTH ONCE DAILY. TAKE AN ADDITIONAL TABLET ASNEEDED FOR WEIGHT GAIN GREATER THAN 2 POUNDS A NIGHT. 04/04/18  Yes [provider]  lisinopril (PRINIVIL,ZESTRIL) 5 MG tablet Take by mouth. 03/28/18  Yes [provider]  Melatonin 1 MG TABS Take 2 mg by mouth at bedtime.    Yes [provider]  metaxalone (SKELAXIN) 800 MG tablet Take 800 mg by mouth daily.   Yes [provider]  omeprazole (PRILOSEC) 20 MG capsule TAKE 1 CAPSULE BY MOUTH EVERY DAY. 07/27/17  Yes Jerrol Banana., MD  tamsulosin [facility]) 0.4 MG CAPS capsule TAKE 1 CAPSULE BY MOUTH DAILY 12/07/18  Yes Jerrol Banana., MD  temazepam (RESTORIL) 30 MG capsule TAKE 1 CAPSULE BY MOUTH EVERY DAY FOR SLEEP 07/27/19  Yes Jerrol Banana., MD  venlafaxine XR (EFFEXOR-XR) 75 MG 24 hr capsule TAKE 3 CAPSULES BY MOUTH EVERY MORNING 09/27/18  Yes Jerrol Banana., MD  doxycycline (VIBRAMYCIN) 100 MG capsule Take 1 capsule (100 mg total) by mouth 2 (two) times daily. 09/05/19   Coral Spikes, DO  HYDROcodone-homatropine (HYCODAN) 5-1.5 MG/5ML syrup Take 5 mLs by mouth every 6 (six) hours as needed for cough. 09/05/19   Coral Spikes, DO  metoprolol succinate (TOPROL-XL) 50 MG 24 hr tablet TAKE 1 TABLET BY MOUTH DAILY. 10/27/17   Jerrol Banana., MD  spironolactone (ALDACTONE) 25 MG tablet Take by mouth. 03/17/18 05/31/19  [provider]    Family History Family History  Problem Relation Age of Onset  . Arthritis Mother   . Hypertension Mother   . Colon cancer Mother   . Other Mother        lymph node  . Diabetes Father   . Congestive Heart Failure Father   . Arthritis Sister     Social History Social History   Tobacco Use  . Smoking status: Former Smoker    Types: Cigarettes    Quit date: 1974    Years since quitting: 47.4  . Smokeless tobacco: Never Used  . Tobacco comment: 1974  Substance Use Topics  . Alcohol use: Yes    Alcohol/week: 0.0 - 2.0 standard  drinks  . Drug use: No     Allergies   Sulfamethoxazole-trimethoprim   Review of Systems Review of Systems  Respiratory: Positive for cough.   Skin: Positive for wound.   Physical Exam Triage Vital Signs ED Triage Vitals  Enc Vitals Group     BP 09/05/19 1250 101/72     Pulse Rate 09/05/19 1250 76     Resp 09/05/19 1250 18     Temp 09/05/19 1250 98.2 F (36.8 C)     Temp Source 09/05/19 1250 Oral     SpO2 09/05/19 1250 97 %     Weight 09/05/19 1245 229 lb 15 oz (104.3 kg)     Height 09/05/19 1245 6\' 3"  (1.905 m)     Head Circumference --  Peak Flow --      Pain Score 09/05/19 1245 0     Pain Loc --      Pain Edu? --      Excl. in [place]? --    Updated Vital Signs BP 101/72 (BP Location: Right Arm)   Pulse 76   Temp 98.2 F (36.8 C) (Oral)   Resp 18   Ht 6\' 3"  (1.905 m)   Wt 104.3 kg   SpO2 97%   BMI 28.74 kg/m   Visual Acuity Right Eye Distance:   Left Eye Distance:   Bilateral Distance:    Right Eye Near:   Left Eye Near:    Bilateral Near:     Physical Exam Vitals and nursing note reviewed.  Constitutional:      General: He is not in acute distress.    Appearance: Normal appearance. He is not ill-appearing.  HENT:     Head:      Comments: Small superficial puncture wound noted at the labelled location. Eyes:     General:        Right eye: No discharge.        Left eye: No discharge.     Conjunctiva/sclera: Conjunctivae normal.  Cardiovascular:     Rate and Rhythm: Normal rate and regular rhythm.     Heart sounds: No murmur.  Pulmonary:     Effort: Pulmonary effort is normal.     Breath sounds: Normal breath sounds. No wheezing, rhonchi or rales.  Neurological:     Mental Status: He is alert.  Psychiatric:        Mood and Affect: Mood normal.        Behavior: Behavior normal.      UC Treatments / Results  Labs (all labs ordered are listed, but only abnormal results are displayed) Labs Reviewed - No data to  display  EKG   Radiology DG Chest 2 View  Result Date: 09/05/2019 CLINICAL DATA:  Productive cough. EXAM: CHEST - 2 VIEW COMPARISON:  May 31, 2019. FINDINGS: The heart size and mediastinal contours are within normal limits. Both lungs are clear. The visualized skeletal structures are unremarkable. IMPRESSION: No active cardiopulmonary disease. Electronically Signed   By: Marijo Conception M.D.   On: 09/05/2019 13:28    Procedures Procedures (including critical care time)  Medications Ordered in UC Medications - No data to display  Initial Impression / Assessment and Plan / UC Course  I have reviewed the triage vital signs and the nursing notes.  Pertinent labs & imaging results that were available during my care of the patient were reviewed by me and considered in my medical decision making (see chart for details).    69 year old male presents with acute bronchitis.  Chest x-ray obtained today to ensure no underlying pneumonia.  Interpreted independently by me.  Interpretation: No acute infiltrate.  Placing on doxycycline.  Hycodan for cough.  Additionally, patient has a small laceration which is consistent with a puncture wound.  Very small and no need for repair or further intervention at this time.  Advised to keep clean.  Final Clinical Impressions(s) / UC Diagnoses   Final diagnoses:  Acute bronchitis, unspecified organism  Laceration of scalp, initial encounter     Discharge Instructions     Medications as prescribed.  Keep wound clean.  Follow up with PCP.  Take care  Dr. Lacinda Axon   ED Prescriptions    Medication Sig Dispense Auth. Provider   doxycycline (VIBRAMYCIN) 100  MG capsule Take 1 capsule (100 mg total) by mouth 2 (two) times daily. 14 capsule Jamarion [place] G, DO   HYDROcodone-homatropine (HYCODAN) 5-1.5 MG/5ML syrup Take 5 mLs by mouth every 6 (six) hours as needed for cough. 60 mL Coral Spikes, DO     PDMP not reviewed this encounter.   Coral Spikes, DO 09/05/19 1418    Thersa Salt G, DO 09/05/19 1418

## 2019-09-05 NOTE — Discharge Instructions (Signed)
Medications as prescribed.  Keep wound clean.  Follow up with PCP.  Take care  Dr. Lacinda Axon

## 2019-09-27 ENCOUNTER — Ambulatory Visit (INDEPENDENT_AMBULATORY_CARE_PROVIDER_SITE_OTHER): Payer: Medicare Other | Admitting: Family Medicine

## 2019-09-27 DIAGNOSIS — R05 Cough: Secondary | ICD-10-CM | POA: Diagnosis not present

## 2019-09-27 DIAGNOSIS — J418 Mixed simple and mucopurulent chronic bronchitis: Secondary | ICD-10-CM | POA: Diagnosis not present

## 2019-09-27 DIAGNOSIS — R059 Cough, unspecified: Secondary | ICD-10-CM

## 2019-09-27 MED ORDER — PREDNISONE 10 MG (21) PO TBPK: ORAL_TABLET | ORAL | 1 refills | Status: DC

## 2019-09-27 MED ORDER — AZITHROMYCIN 250 MG PO TABS: ORAL_TABLET | ORAL | 0 refills | Status: DC

## 2019-09-27 MED ORDER — HYDROCODONE-HOMATROPINE 5-1.5 MG/5ML PO SYRP: 5.0000 mL | ORAL_SOLUTION | Freq: Four times a day (QID) | ORAL | 0 refills | Status: DC | PRN

## 2019-09-27 NOTE — Progress Notes (Signed)
Virtual telephone visit    Virtual Visit via Telephone Note   This visit type was conducted due to national recommendations for restrictions regarding the COVID-19 Pandemic (e.g. social distancing) in an effort to limit this patient's exposure and mitigate transmission in our community. Due to his co-morbid illnesses, this patient is at least at moderate risk for complications without adequate follow up. This format is felt to be most appropriate for this patient at this time. The patient did not have access to video technology or had technical difficulties with video requiring transitioning to audio format only (telephone). Physical exam was limited to content and character of the telephone converstion.    Patient location: home Provider location: office   Visit Date: 09/27/2019  Today's healthcare provider: [name], MD   No chief complaint on file.  Subjective    HPI Patient history is that he had a Covid infection back in mid February and then later February developed a cough.  He was seen in the urgent care center on February 24 and was treated with Z-Pak and Tessalon Perles.  At that time he was found on CT scan to have ground glass appearance on chest x-ray left greater than right consistent with COVID-19 positive test (U07.1, COVID-19) with Acute Pneumonia (J12.89, Other viral pneumonia) (If respiratory failure or sepsis present, add as separate assessment).  He also was found to have a 6 mm pulmonary nodule and was recommended to have follow-up in 6 to 12 months on CT scan. Was seen in the urgent care center on June 1 and was treated with doxycycline and Hycodan syrup.  He states Hycodan helped.  He has no shortness of breath no hemoptysis no fevers no weight loss.  He states he just has a dry cough that is occasionally paroxysmal.         Medications: Outpatient Medications Prior to Visit  Medication Sig  . alprazolam (XANAX) 2 MG tablet Take 0.5 tablets (1  mg total) by mouth as needed.  Marland Kitchen amLODipine (NORVASC) 5 MG tablet Take by mouth.  Marland Kitchen apixaban (ELIQUIS) 5 MG TABS tablet Take 5 mg by mouth 2 (two) times daily.  Marland Kitchen buPROPion (WELLBUTRIN XL) 300 MG 24 hr tablet TAKE 1 TABLET BY MOUTH DAILY  . Cholecalciferol (VITAMIN D) 2000 UNITS CAPS Take 1 capsule by mouth daily.   Marland Kitchen doxycycline (VIBRAMYCIN) 100 MG capsule Take 1 capsule (100 mg total) by mouth 2 (two) times daily.  . finasteride (PROSCAR) 5 MG tablet TAKE 1 TABLET BY MOUTH DAILY  . folic acid (FOLVITE) 151 MCG tablet Take 800 mcg by mouth daily.   . furosemide (LASIX) 20 MG tablet TAKE 1 TABLET (20 MG TOTAL) BY MOUTH ONCE DAILY. TAKE AN ADDITIONAL TABLET ASNEEDED FOR WEIGHT GAIN GREATER THAN 2 POUNDS A NIGHT.  Marland Kitchen HYDROcodone-homatropine (HYCODAN) 5-1.5 MG/5ML syrup Take 5 mLs by mouth every 6 (six) hours as needed for cough.  Marland Kitchen lisinopril (PRINIVIL,ZESTRIL) 5 MG tablet Take by mouth.  . Melatonin 1 MG TABS Take 2 mg by mouth at bedtime.   . metaxalone (SKELAXIN) 800 MG tablet Take 800 mg by mouth daily.  . metoprolol succinate (TOPROL-XL) 50 MG 24 hr tablet TAKE 1 TABLET BY MOUTH DAILY.  Marland Kitchen omeprazole (PRILOSEC) 20 MG capsule TAKE 1 CAPSULE BY MOUTH EVERY DAY.  . tamsulosin (FLOMAX) 0.4 MG CAPS capsule TAKE 1 CAPSULE BY MOUTH DAILY  . temazepam (RESTORIL) 30 MG capsule TAKE 1 CAPSULE BY MOUTH EVERY DAY FOR SLEEP  .  venlafaxine XR (EFFEXOR-XR) 75 MG 24 hr capsule TAKE 3 CAPSULES BY MOUTH EVERY MORNING   No facility-administered medications prior to visit.    Review of Systems     Objective    There were no vitals taken for this visit.      Assessment & Plan     1. Cough I really think this is due to his Covid pneumonia in February.  Emina treated with Z-Pak and prednisone taper and then Hycodan syrup.  See him back in 1 to 2 months.  May need treatment with inhalers.  Will also need follow-up on his CT scan of the pulmonary nodule. - HYDROcodone-homatropine (HYCODAN) 5-1.5 MG/5ML  syrup; Take 5 mLs by mouth every 6 (six) hours as needed for cough.  Dispense: 120 mL; Refill: 0 - predniSONE (STERAPRED UNI-PAK 21 TAB) 10 MG (21) TBPK tablet; Taper 6-5-4-3-2-1  Dispense: 21 tablet; Refill: 1  2. Mixed simple and mucopurulent chronic bronchitis ([place]) As above.  May need pulmonary referral if this is persistent.  Denies PND or orthopnea.  No signs of this is cardiac. - azithromycin (ZITHROMAX) 250 MG tablet; 2 p.o. on day 1 then 1 tablet daily days 2 through 5  Dispense: 6 tablet; Refill: 0 - predniSONE (STERAPRED UNI-PAK 21 TAB) 10 MG (21) TBPK tablet; Taper 6-5-4-3-2-1  Dispense: 21 tablet; Refill: 1   No follow-ups on file.    I discussed the assessment and treatment plan with the patient. The patient was provided an opportunity to ask questions and all were answered. The patient agreed with the plan and demonstrated an understanding of the instructions.   The patient was advised to call back or seek an in-person evaluation if the symptoms worsen or if the condition fails to improve as anticipated.  I provided 13 minutes of non-face-to-face time during this encounter.    Richard Cranford Mon, MD [facility] [phone] (phone) [phone] (fax)  [place]

## 2019-09-29 ENCOUNTER — Telehealth: Payer: Self-pay | Admitting: Family Medicine

## 2019-09-29 NOTE — Telephone Encounter (Signed)
Call to pharmacy- Verbal Rx given as written in chart.

## 2019-09-29 NOTE — Telephone Encounter (Signed)
Copied from [place] [phone]. Topic: General - Other >> Sep 29, 2019 11:10 AM Antonieta Iba C wrote: Reason for CRM: pt called in for assistance. Pt says that his Rx for azithromycin (ZITHROMAX) 250 MG tablet wasn't received by pharmacy. Showing in system the Rx didn't go through.   Please assist pt by resending Rx.

## 2019-10-29 IMAGING — CR DG CHEST 2V
1 series · 2 of 2 positions shown · non-contrast
Comparison: December 15, 2010

CLINICAL DATA: Shortness of breath

EXAM:
CHEST - 2 VIEW

[Series 1: dg chest 2 view · 0.14mm/px · 2 of 2 slices shown]
[im 1/2]
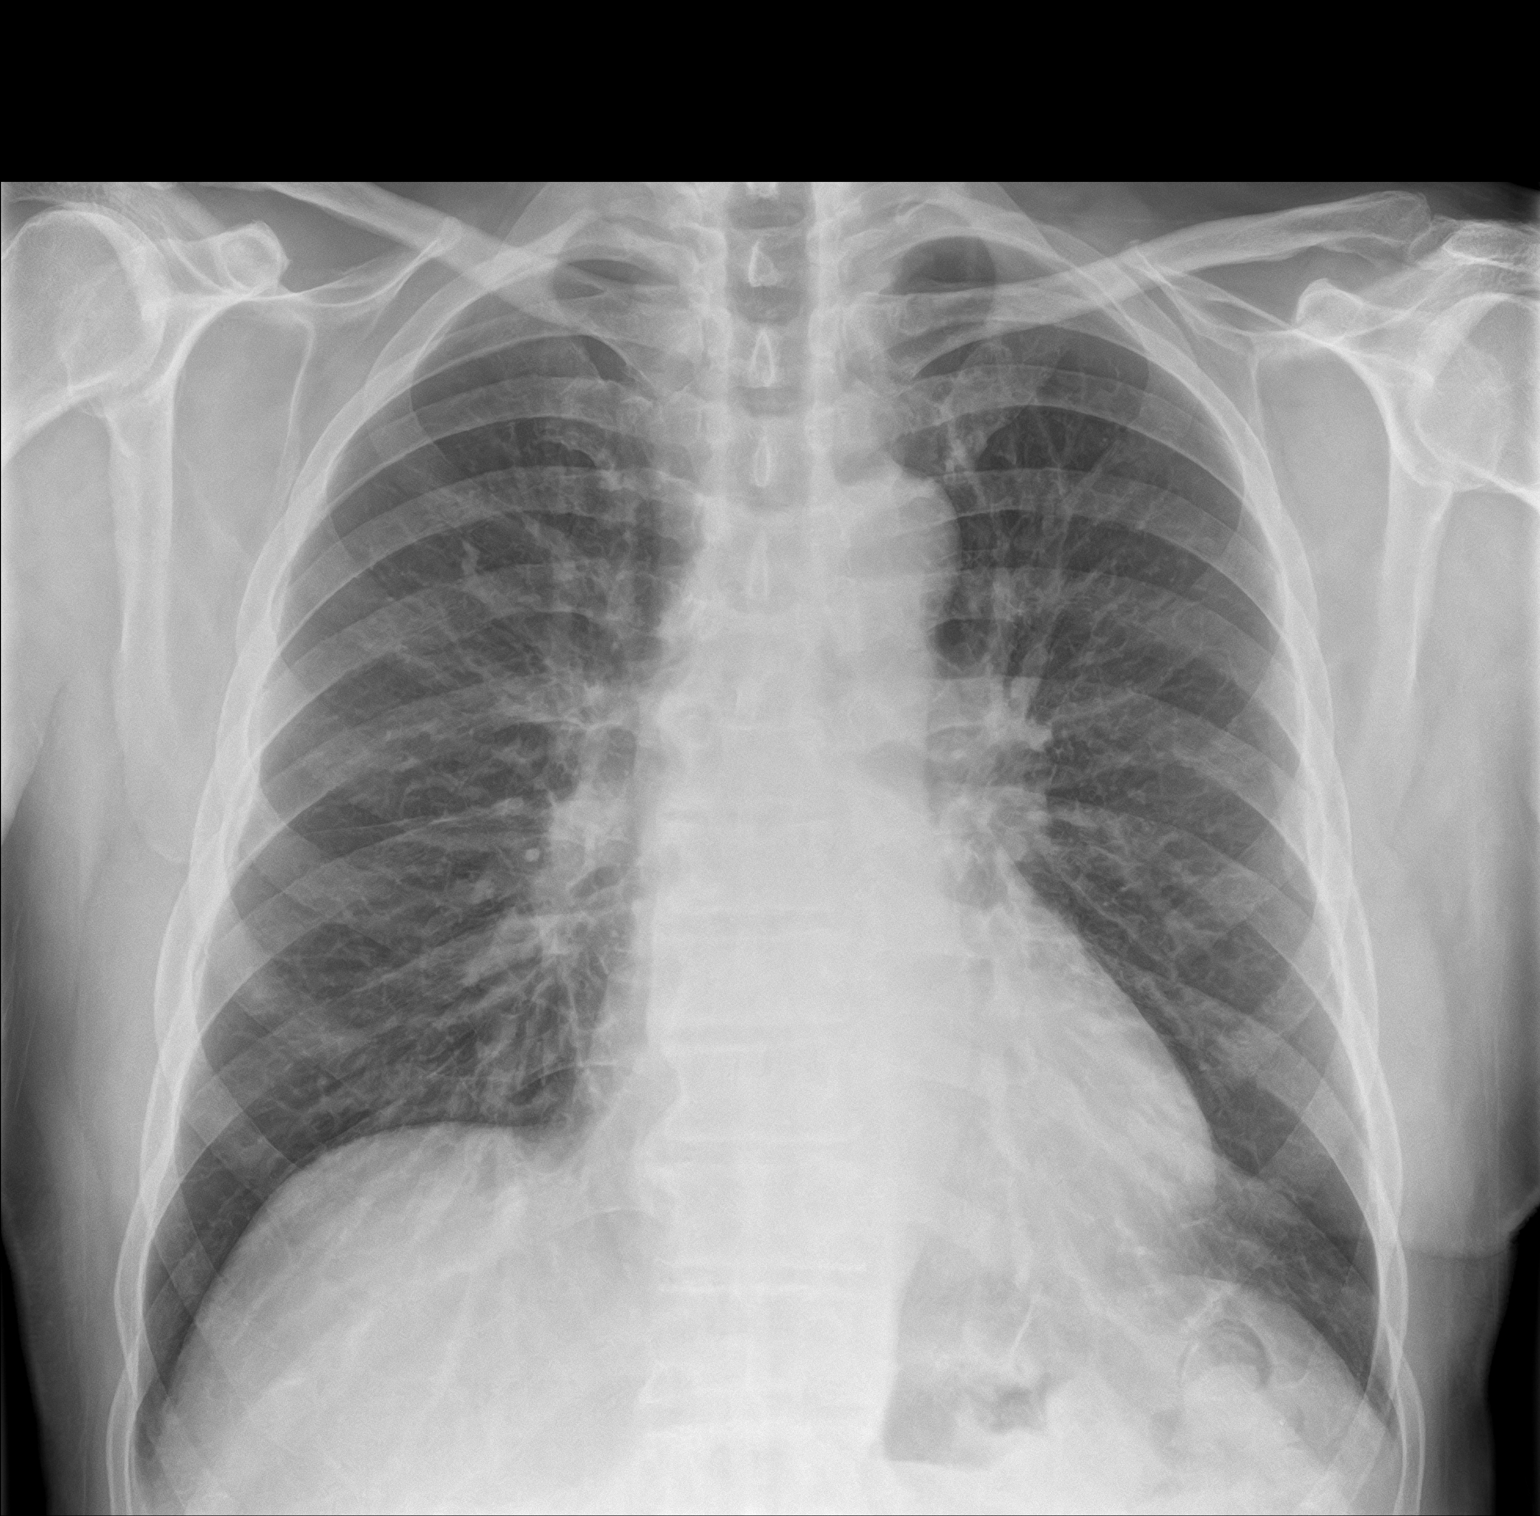
[im 2/2]
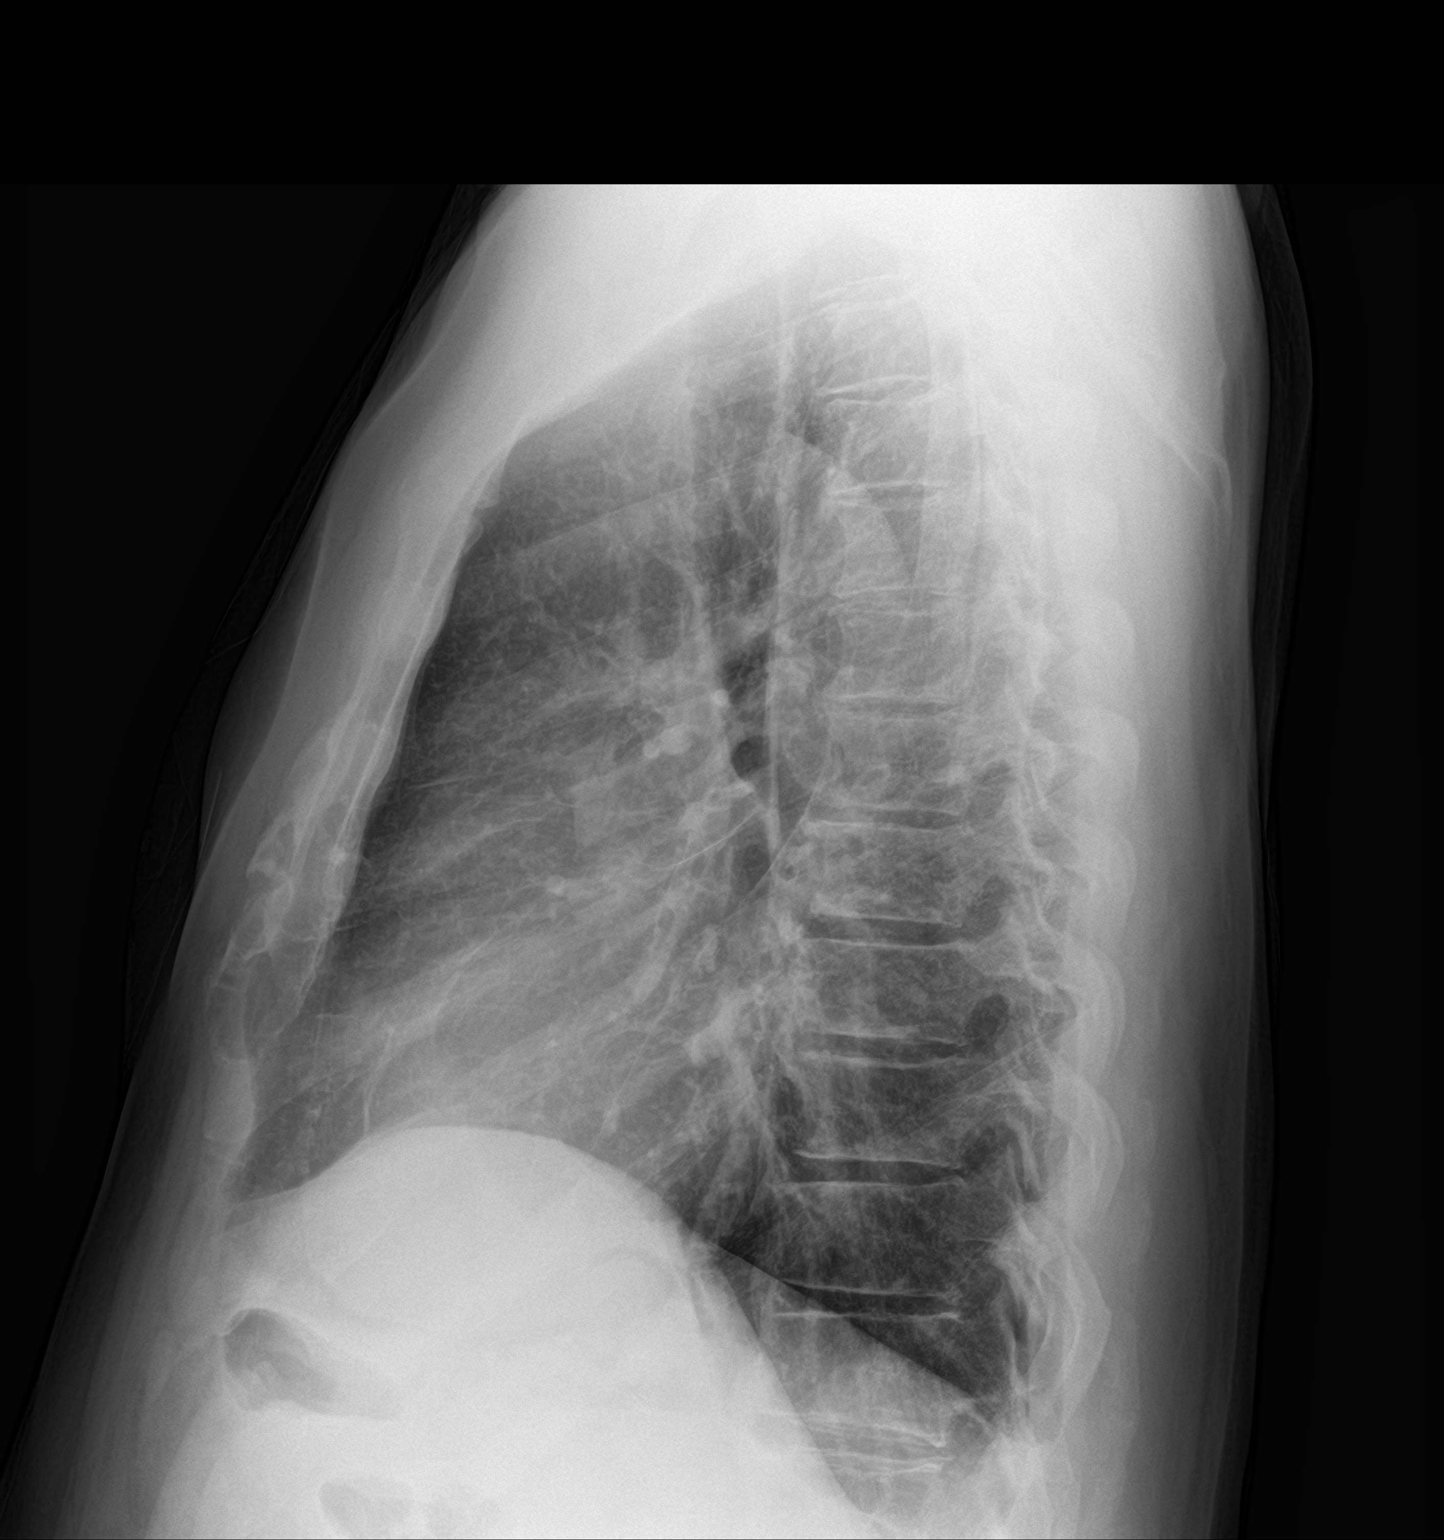

[2 of 2 positions shown; findings below may reference images not displayed]

FINDINGS: There is no edema or consolidation. Heart is borderline enlarged
with pulmonary venous hypertension. No adenopathy. No bone lesions.
IMPRESSION: Pulmonary vascular congestion without edema or consolidation.

## 2019-11-03 ENCOUNTER — Other Ambulatory Visit: Payer: Self-pay | Admitting: Family Medicine

## 2019-11-03 DIAGNOSIS — F32A Depression, unspecified: Secondary | ICD-10-CM

## 2019-12-26 NOTE — Progress Notes (Signed)
Trena Platt Lytle [place],acting as a scribe for [name], MD.,have documented all relevant documentation on the behalf of [name], MD,as directed by  [name], MD while in the presence of [name], MD.  Annual Wellness Visit     Patient: Jeffrey French, Male    DOB: 1950-11-03, 69 y.o.   MRN: [id number] Visit Date: 12/27/2019  Today's Provider: [name], MD   Chief Complaint  Patient presents with   Annual Exam   Subjective    Jeffrey French is a 69 y.o. male who presents today for his Annual Wellness Visit. He reports consuming a general diet. Home exercise routine includes walking. He generally feels well. He reports sleeping well. He does not have additional problems to discuss today.   HPI   Social History   Tobacco Use   Smoking status: Former Smoker    Types: Cigarettes    Quit date: 1974    Years since quitting: 47.7   Smokeless tobacco: Never Used   Tobacco comment: 1974  Vaping Use   Vaping Use: Never used  Substance Use Topics   Alcohol use: Yes    Alcohol/week: 0.0 - 2.0 standard drinks   Drug use: No       Medications: Outpatient Medications Prior to Visit  Medication Sig   alprazolam (XANAX) 2 MG tablet Take 0.5 tablets (1 mg total) by mouth as needed.   amLODipine (NORVASC) 5 MG tablet Take by mouth.   apixaban (ELIQUIS) 5 MG TABS tablet Take 5 mg by mouth 2 (two) times daily.   buPROPion (WELLBUTRIN XL) 300 MG 24 hr tablet TAKE 1 TABLET BY MOUTH DAILY   Cholecalciferol (VITAMIN D) 2000 UNITS CAPS Take 1 capsule by mouth daily.    finasteride (PROSCAR) 5 MG tablet TAKE 1 TABLET BY MOUTH DAILY   furosemide (LASIX) 20 MG tablet TAKE 1 TABLET (20 MG TOTAL) BY MOUTH ONCE DAILY. TAKE AN ADDITIONAL TABLET ASNEEDED FOR WEIGHT GAIN GREATER THAN 2 POUNDS A NIGHT.   HYDROcodone-homatropine (HYCODAN) 5-1.5 MG/5ML syrup Take 5 mLs by mouth every 6 (six) hours as needed for cough.   lisinopril  (PRINIVIL,ZESTRIL) 5 MG tablet Take by mouth.   Melatonin 1 MG TABS Take 2 mg by mouth at bedtime.    metoprolol succinate (TOPROL-XL) 50 MG 24 hr tablet TAKE 1 TABLET BY MOUTH DAILY.   omeprazole (PRILOSEC) 20 MG capsule TAKE 1 CAPSULE BY MOUTH EVERY DAY.   tamsulosin (FLOMAX) 0.4 MG CAPS capsule TAKE 1 CAPSULE BY MOUTH DAILY   temazepam (RESTORIL) 30 MG capsule TAKE 1 CAPSULE BY MOUTH EVERY DAY FOR SLEEP   venlafaxine XR (EFFEXOR-XR) 75 MG 24 hr capsule TAKE 3 CAPSULES BY MOUTH EVERY MORNING   [DISCONTINUED] folic acid (FOLVITE) 240 MCG tablet Take 800 mcg by mouth daily.    doxycycline (VIBRAMYCIN) 100 MG capsule Take 1 capsule (100 mg total) by mouth 2 (two) times daily. (Patient not taking: Reported on 12/27/2019)   HYDROcodone-homatropine (HYCODAN) 5-1.5 MG/5ML syrup Take 5 mLs by mouth every 6 (six) hours as needed for cough. (Patient not taking: Reported on 12/27/2019)   metaxalone (SKELAXIN) 800 MG tablet Take 800 mg by mouth daily. (Patient not taking: Reported on 12/27/2019)   [DISCONTINUED] azithromycin (ZITHROMAX) 250 MG tablet 2 p.o. on day 1 then 1 tablet daily days 2 through 5 (Patient not taking: Reported on 12/27/2019)   [DISCONTINUED] predniSONE (STERAPRED UNI-PAK 21 TAB) 10 MG (21) TBPK tablet Taper 6-5-4-3-2-1 (Patient  not taking: Reported on 12/27/2019)   No facility-administered medications prior to visit.    Allergies  Allergen Reactions   Sulfamethoxazole-Trimethoprim Rash    (in mouth)    Patient Care Team: Jerrol Banana., MD as PCP - General (Family Medicine) Vladimir Crofts, MD as Consulting Physician (Neurology) Isaias Cowman, MD as Consulting Physician (Cardiology) Murrell Redden, MD as Consulting Physician (Urology) Thornton Park, MD as Referring Physician (Orthopedic Surgery) Ralene Bathe, MD as Consulting Physician (Dermatology) Beverly Gust, MD as Consulting Physician (Otolaryngology)  Review of Systems    Constitutional: Negative.   HENT: Positive for tinnitus.   Eyes: Negative.   Respiratory: Negative.   Cardiovascular: Negative.   Gastrointestinal: Negative.   Endocrine: Negative.   Genitourinary: Negative.   Musculoskeletal: Negative.   Skin: Negative.   Allergic/Immunologic: Negative.   Neurological: Negative.   Hematological: Negative.   Psychiatric/Behavioral: Negative.        Objective    Vitals: BP 118/69 (BP Location: Left Arm, Patient Position: Sitting, Cuff Size: Large)    Pulse 64    Temp 98.8 F (37.1 C) (Oral)    Ht 6\' 3"  (1.905 m)    Wt 222 lb 9.6 oz (101 kg)    BMI 27.82 kg/m  BP Readings from Last 3 Encounters:  12/27/19 118/69  09/05/19 101/72  06/20/19 130/74   Wt Readings from Last 3 Encounters:  12/27/19 222 lb 9.6 oz (101 kg)  09/05/19 229 lb 15 oz (104.3 kg)  06/20/19 230 lb (104.3 kg)      Physical Exam Vitals reviewed.  Constitutional:      Appearance: He is well-developed.  HENT:     Head: Normocephalic and atraumatic.     Right Ear: External ear normal.     Left Ear: External ear normal.  Eyes:     General: No scleral icterus.    Conjunctiva/sclera: Conjunctivae normal.  Neck:     Thyroid: No thyromegaly.     Vascular: No carotid bruit.  Cardiovascular:     Rate and Rhythm: Normal rate and regular rhythm.     Heart sounds: Normal heart sounds.  Pulmonary:     Effort: Pulmonary effort is normal.     Breath sounds: Normal breath sounds.  Abdominal:     Palpations: Abdomen is soft.  Genitourinary:    Penis: Normal.      Testes: Normal.     Prostate: Normal.     Rectum: Normal.     Comments: Small prostate nodule. Lymphadenopathy:     Cervical: No cervical adenopathy.  Skin:    General: Skin is warm and dry.     Comments: Fair skin. Senile purpura.  Neurological:     Mental Status: He is alert and oriented to person, place, and time. Mental status is at baseline.  Psychiatric:        Mood and Affect: Mood normal.         Behavior: Behavior normal.        Thought Content: Thought content normal.        Judgment: Judgment normal.      Most recent functional status assessment: In your present state of health, do you have any difficulty performing the following activities: 12/27/2019  Hearing? Y  Vision? N  Difficulty concentrating or making decisions? Y  Walking or climbing stairs? N  Dressing or bathing? N  Doing errands, shopping? N  Some recent data might be hidden   Most recent fall risk assessment: Fall  Risk  12/27/2019  Falls in the past year? 1  Number falls in past yr: 0  Injury with Fall? 0  Follow up Falls evaluation completed    Most recent depression screenings: PHQ 2/9 Scores 12/27/2019 05/19/2018  PHQ - 2 Score 0 2  PHQ- 9 Score 1 -   Most recent cognitive screening: 6CIT Screen 04/28/2016  What Year? 0 points  What month? 0 points  What time? 0 points  Count back from 20 0 points  Months in reverse 0 points  Repeat phrase 2 points  Total Score 2   Most recent Audit-C alcohol use screening Alcohol Use Disorder Test (AUDIT) 12/27/2019  1. How often do you have a drink containing alcohol? 1  2. How many drinks containing alcohol do you have on a typical day when you are drinking? 2  3. How often do you have six or more drinks on one occasion? 0  AUDIT-C Score 3  4. How often during the last year have you found that you were not able to stop drinking once you had started? 0  5. How often during the last year have you failed to do what was normally expected from you because of drinking? 0  6. How often during the last year have you needed a first drink in the morning to get yourself going after a heavy drinking session? 0  7. How often during the last year have you had a feeling of guilt of remorse after drinking? 0  8. How often during the last year have you been unable to remember what happened the night before because you had been drinking? 0  9. Have you or someone else been  injured as a result of your drinking? 0  10. Has a relative or friend or a doctor or another health worker been concerned about your drinking or suggested you cut down? 0  Alcohol Use Disorder Identification Test Final Score (AUDIT) 3   A score of 3 or more in women, and 4 or more in men indicates increased risk for alcohol abuse, EXCEPT if all of the points are from question 1   Results for orders placed or performed in visit on 12/27/19  POCT urinalysis dipstick  Result Value Ref Range   Color, UA     Clarity, UA     Glucose, UA Negative Negative   Bilirubin, UA negative    Ketones, UA negative    Spec Grav, UA 1.020 1.010 - 1.025   Blood, UA negative    pH, UA 6.0 5.0 - 8.0   Protein, UA Negative Negative   Urobilinogen, UA 0.2 0.2 or 1.0 E.U./dL   Nitrite, UA negative    Leukocytes, UA Negative Negative   Appearance     Odor      Assessment & Plan     Annual wellness visit done today including the all of the following: Reviewed patient's Family Medical History Reviewed and updated list of patient's medical providers Assessment of cognitive impairment was done Assessed patient's functional ability Established a written schedule for health screening services Health Risk Assessent Completed and Reviewed  Exercise Activities and Dietary recommendations Goals     DIET - EAT MORE FRUITS AND VEGETABLES     Recommend increasing amount of fruits and vegetables in daily diet to at least 1 serving of each daily.        Immunization History  Administered Date(s) Administered   Hepatitis A 11/29/2009   Influenza-Unspecified 01/17/2018   Pneumococcal  Conjugate-13 01/13/2018   Pneumococcal Polysaccharide-23 01/18/2019   Td 01/13/2018   Tdap 06/21/2015   Zoster 01/19/2013    Health Maintenance  Topic Date Due   COVID-19 Vaccine (1) Never done   INFLUENZA VACCINE  11/05/2019   COLONOSCOPY  11/22/2023   TETANUS/TDAP  01/14/2028   Hepatitis C Screening   Completed   PNA vac Low Risk Adult  Addressed     Discussed health benefits of physical activity, and encouraged him to engage in regular exercise appropriate for his age and condition.   1. Benign prostatic hyperplasia, unspecified whether lower urinary tract symptoms present  - POCT urinalysis dipstick - PSA  2. Prostate nodule Patient declines urology referral presently, if PSA is elevated would definitely refer to urology. - PSA  3. Hyperglycemia  - Hemoglobin A1c  4. Encounter for screening fecal occult blood testing  - IFOBT POC (occult bld, rslt in office)  5. Chronic atrial fibrillation (HCC) On Eliquis  6. OSA (obstructive sleep apnea)  7. Status post ablation of atrial fibrillation   8. Elevated prostate specific antigen (PSA)   9. History of depression Patient states he is clinically stable at this point  10. MCI (mild cognitive impairment) MMSE on next visit.    Return in about 6 months (around 06/25/2020).     I, [name], MD, have reviewed all documentation for this visit. The documentation on 12/31/19 for the exam, diagnosis, procedures, and orders are all accurate and complete.    Richard Cranford Mon, MD  [facility] [phone] (phone) [phone] (fax)  [place]

## 2019-12-27 ENCOUNTER — Other Ambulatory Visit: Payer: Self-pay

## 2019-12-27 ENCOUNTER — Ambulatory Visit (INDEPENDENT_AMBULATORY_CARE_PROVIDER_SITE_OTHER): Payer: Medicare Other | Admitting: Family Medicine

## 2019-12-27 ENCOUNTER — Encounter: Payer: Self-pay | Admitting: Family Medicine

## 2019-12-27 VITALS — BP 118/69 | HR 64 | Temp 98.8°F | Ht 75.0 in | Wt 222.6 lb

## 2019-12-27 DIAGNOSIS — N400 Enlarged prostate without lower urinary tract symptoms: Secondary | ICD-10-CM | POA: Diagnosis not present

## 2019-12-27 DIAGNOSIS — R972 Elevated prostate specific antigen [PSA]: Secondary | ICD-10-CM

## 2019-12-27 DIAGNOSIS — G4733 Obstructive sleep apnea (adult) (pediatric): Secondary | ICD-10-CM

## 2019-12-27 DIAGNOSIS — I482 Chronic atrial fibrillation, unspecified: Secondary | ICD-10-CM

## 2019-12-27 DIAGNOSIS — Z8659 Personal history of other mental and behavioral disorders: Secondary | ICD-10-CM

## 2019-12-27 DIAGNOSIS — Z1211 Encounter for screening for malignant neoplasm of colon: Secondary | ICD-10-CM | POA: Diagnosis not present

## 2019-12-27 DIAGNOSIS — Z0000 Encounter for general adult medical examination without abnormal findings: Secondary | ICD-10-CM | POA: Diagnosis not present

## 2019-12-27 DIAGNOSIS — R739 Hyperglycemia, unspecified: Secondary | ICD-10-CM | POA: Diagnosis not present

## 2019-12-27 DIAGNOSIS — N402 Nodular prostate without lower urinary tract symptoms: Secondary | ICD-10-CM | POA: Diagnosis not present

## 2019-12-27 DIAGNOSIS — Z8679 Personal history of other diseases of the circulatory system: Secondary | ICD-10-CM

## 2019-12-27 DIAGNOSIS — Z9889 Other specified postprocedural states: Secondary | ICD-10-CM

## 2019-12-27 DIAGNOSIS — G3184 Mild cognitive impairment, so stated: Secondary | ICD-10-CM

## 2019-12-27 LAB — POCT URINALYSIS DIPSTICK
Bilirubin, UA: NEGATIVE
Blood, UA: NEGATIVE
Glucose, UA: NEGATIVE
Ketones, UA: NEGATIVE
Leukocytes, UA: NEGATIVE
Nitrite, UA: NEGATIVE
Protein, UA: NEGATIVE
Spec Grav, UA: 1.02 (ref 1.010–1.025)
Urobilinogen, UA: 0.2 E.U./dL
pH, UA: 6 (ref 5.0–8.0)

## 2019-12-27 LAB — IFOBT (OCCULT BLOOD): IFOBT: NEGATIVE

## 2019-12-27 MED ORDER — FOLIC ACID 800 MCG PO TABS: 800.0000 ug | ORAL_TABLET | Freq: Every day | ORAL | 4 refills | Status: AC

## 2019-12-29 ENCOUNTER — Other Ambulatory Visit: Payer: Self-pay | Admitting: Family Medicine

## 2019-12-29 DIAGNOSIS — N400 Enlarged prostate without lower urinary tract symptoms: Secondary | ICD-10-CM

## 2019-12-29 DIAGNOSIS — F32A Depression, unspecified: Secondary | ICD-10-CM

## 2019-12-29 NOTE — Telephone Encounter (Signed)
Requested medication (s) are due for refill today: Flomax, Proscar - yes.  Effexor - early  Requested medication (s) are on the active medication list: Yes  Last refill:  Flomax, Proscar - 12/07/18    Future visit scheduled: Yes  Notes to clinic:  Flomax and Proscar have expired.    Requested Prescriptions  Pending Prescriptions Disp Refills   tamsulosin (FLOMAX) 0.4 MG CAPS capsule [Pharmacy Med Name: TAMSULOSIN HCL 0.4 MG CAP] 30 capsule 11    Sig: TAKE 1 CAPSULE BY MOUTH DAILY      Urology: Alpha-Adrenergic Blocker Passed - 12/29/2019 10:41 AM      Passed - Last BP in normal range    BP Readings from Last 1 Encounters:  12/27/19 118/69          Passed - Valid encounter within last 12 months    Recent Outpatient Visits           2 days ago Benign prostatic hyperplasia, unspecified whether lower urinary tract symptoms present   [facility] Jerrol Banana., MD   3 months ago Cough   [facility] Jerrol Banana., MD   6 months ago Essential (primary) hypertension   [facility] Jerrol Banana., MD   11 months ago Unsteady gait   [facility] Jerrol Banana., MD   1 year ago Dizziness   [facility] Jerrol Banana., MD       Future Appointments             In 6 months Jerrol Banana., MD [facility]              finasteride (PROSCAR) 5 MG tablet [Pharmacy Med Name: FINASTERIDE 5 MG TAB] 30 tablet 11    Sig: TAKE 1 TABLET BY MOUTH DAILY      Urology: 5-alpha Reductase Inhibitors Passed - 12/29/2019 10:41 AM      Passed - Valid encounter within last 12 months    Recent Outpatient Visits           2 days ago Benign prostatic hyperplasia, unspecified whether lower urinary tract symptoms present   [facility] Jerrol Ban[facility]., MD   3 months ago Cough   [facility] Jerrol Banana., MD   6  months ago Essential (primary) hypertension   [facility] Jerrol Banana., MD   11 months ago Unsteady gait   [facility] Jerrol Banana., MD   1 year ago Dizziness   [facility] Jerrol Banana., MD       Future Appointments             In 6 months Jerrol Banana., MD [facility]              venlafaxine XR (EFFEXOR-XR) 75 MG 24 hr capsule [Pharmacy Med Name: VENLAFAXINE HCL ER 75 MG CAP] 270 capsule 0    Sig: TAKE 3 CAPSULES BY MOUTH EVERY MORNING      Psychiatry: Antidepressants - SNRI - desvenlafaxine & venlafaxine Failed - 12/29/2019 10:41 AM      Failed - LDL in normal range and within 360 days    LDL Cholesterol  Date Value Ref Range Status  07/13/2019 141 (H) 0 - 99 mg/dL Final    Comment:           Total Cholesterol/HDL:CHD  Risk Coronary Heart Disease Risk Table                     Men   Women  1/2 Average Risk   3.4   3.3  Average Risk       5.0   4.4  2 X Average Risk   9.6   7.1  3 X Average Risk  23.4   11.0        Use the calculated Patient Ratio above and the CHD Risk Table to determine the patient's CHD Risk.        ATP III CLASSIFICATION (LDL):  <100     mg/dL   Optimal  100-129  mg/dL   Near or Above                    Optimal  130-159  mg/dL   Borderline  160-189  mg/dL   High  >190     mg/dL   Very High Performed at [facility], [address] [place], [place] [address]           Failed - Total Cholesterol in normal range and within 360 days    Cholesterol  Date Value Ref Range Status  07/13/2019 226 (H) 0 - 200 mg/dL Final          Failed - Valid encounter within last 6 months    Recent Outpatient Visits           2 days ago Benign prostatic hyperplasia, unspecified whether lower urinary tract symptoms present   [facility] Jerrol Banana., MD   3 months ago Cough   [facility] Jerrol Banana., MD   6 months ago Essential (primary) hypertension   [facility] Jerrol Banana., MD   11 months ago Unsteady gait   [facility] Jerrol Banana., MD   1 year ago Dizziness   [facility] Jerrol Banana., MD       Future Appointments             In 6 months Jerrol Banana., MD [facility]            Passed - Triglycerides in normal range and within 360 days    Triglycerides  Date Value Ref Range Status  07/13/2019 133 <150 mg/dL Final          Passed - Completed PHQ-2 or PHQ-9 in the last 360 days.      Passed - Last BP in normal range    BP Readings from Last 1 Encounters:  12/27/19 118/69

## 2020-01-02 ENCOUNTER — Other Ambulatory Visit: Payer: Self-pay

## 2020-01-02 ENCOUNTER — Other Ambulatory Visit
Admission: RE | Admit: 2020-01-02 | Discharge: 2020-01-02 | Disposition: A | Discharge status: Home / Self Care | Payer: Medicare Other | Attending: Family Medicine | Admitting: Family Medicine

## 2020-01-02 DIAGNOSIS — R739 Hyperglycemia, unspecified: Secondary | ICD-10-CM | POA: Insufficient documentation

## 2020-01-02 DIAGNOSIS — N402 Nodular prostate without lower urinary tract symptoms: Secondary | ICD-10-CM | POA: Insufficient documentation

## 2020-01-02 DIAGNOSIS — N400 Enlarged prostate without lower urinary tract symptoms: Secondary | ICD-10-CM | POA: Diagnosis present

## 2020-01-02 LAB — HEMOGLOBIN A1C
Hgb A1c MFr Bld: 5.8 % — ABNORMAL HIGH (ref 4.8–5.6)
Mean Plasma Glucose: 119.76 mg/dL

## 2020-01-02 LAB — PSA: Prostatic Specific Antigen: 2.25 ng/mL (ref 0.00–4.00)

## 2020-01-18 ENCOUNTER — Other Ambulatory Visit: Payer: Self-pay | Admitting: Family Medicine

## 2020-01-18 NOTE — Telephone Encounter (Signed)
Requested medication (s) are due for refill today: yes  Requested medication (s) are on the active medication list: yes  Last refill: 07/27/19  #30  3 refills  Future visit scheduled Yes 06/27/20  Notes to clinic   not delegated  Requested Prescriptions  Pending Prescriptions Disp Refills   temazepam (RESTORIL) 30 MG capsule [Pharmacy Med Name: TEMAZEPAM 30 MG CAP] 30 capsule     Sig: TAKE 1 CAPSULE BY MOUTH EVERY DAY FOR SLEEP      Not Delegated - Psychiatry:  Anxiolytics/Hypnotics Failed - 01/18/2020  5:01 PM      Failed - This refill cannot be delegated      Failed - Urine Drug Screen completed in last 360 days.      Failed - Valid encounter within last 6 months    Recent Outpatient Visits           3 weeks ago Benign prostatic hyperplasia, unspecified whether lower urinary tract symptoms present   [facility] Jerrol Banana., MD   3 months ago Cough   [facility] Jerrol Banana., MD   7 months ago Essential (primary) hypertension   [facility] Jerrol Banana., MD   1 year ago Unsteady gait   [facility] Jerrol Banana., MD   1 year ago Dizziness   [facility] Jerrol Banana., MD       Future Appointments             In 5 months Jerrol Banana., MD [facility] [place]

## 2020-01-23 NOTE — Telephone Encounter (Signed)
Pt was advised by pharmacy that they have reached out to the office about 4 different medication that need refills/ Pt is not sure which 4 other than his request for temazepam (RESTORIL) 30 MG capsule  Pt called to check the status of that refill request and asked if office can contact pharmacy about the other 3 medications / please advise

## 2020-01-25 ENCOUNTER — Encounter: Payer: Self-pay | Admitting: Emergency Medicine

## 2020-01-25 ENCOUNTER — Other Ambulatory Visit: Payer: Self-pay

## 2020-01-25 ENCOUNTER — Ambulatory Visit
Admission: EM | Admit: 2020-01-25 | Discharge: 2020-01-25 | Disposition: A | Discharge status: Home / Self Care | Payer: Medicare Other | Attending: Physician Assistant | Admitting: Physician Assistant

## 2020-01-25 DIAGNOSIS — S60459A Superficial foreign body of unspecified finger, initial encounter: Secondary | ICD-10-CM | POA: Diagnosis not present

## 2020-01-25 NOTE — ED Provider Notes (Signed)
[facility]    CSN: [id number] Arrival date & time: 01/25/20  5621      History   Chief Complaint Chief Complaint  Patient presents with  . Finger Injury    DOI 01/24/20    HPI Jeffrey French is a 69 y.o. male presenting for large splinter under the right middle fingernail.  He says that he was woodworking and not wearing gloves when he got a splinter under the nail last night.  He has not tried to remove it himself.  He has not cleaned the area.  Admits to pain.  Denies any bleeding or pustular drainage.  He is able to move the finger and denies any numbness, tingling or weakness.  Has not take anything for pain.  No other complaints or concerns.  HPI  Past Medical History:  Diagnosis Date  . A-fib ([place])    resolved with ablation  . BPH (benign prostatic hyperplasia)   . GERD (gastroesophageal reflux disease)   . Hypertension   . Numbness of foot    Bilateral. since knee replacements  . Sleep apnea    moderate.  unable to tolerate CPAP    Patient Active Problem List   Diagnosis Date Noted  . OSA (obstructive sleep apnea) 05/03/2018  . Acute on chronic systolic CHF (congestive heart failure) ([place]) 02/28/2018  . On amiodarone therapy 02/27/2018  . History of depression 02/26/2018  . Status post ablation of atrial fibrillation 02/17/2018  . Atrial fibrillation ([place]) 01/05/2018  . Heart palpitations 10/21/2017  . Diffuse cerebral atrophy ([place]) 03/20/2017  . SOB (shortness of breath) on exertion 03/19/2017  . New onset atrial flutter ([place]) 09/18/2015  . Irregular heart rhythm 09/17/2015  . Absolute anemia 02/18/2015  . Benign fibroma of prostate 02/18/2015  . Clinical depression 02/18/2015  . Abnormal prostate specific antigen 02/18/2015  . Essential (primary) hypertension 02/18/2015  . Anxiety, generalized 02/18/2015  . Insomnia 02/18/2015  . Arthritis, degenerative 02/18/2015  . Peptic ulcer 02/18/2015  . Elevated prostate specific antigen (PSA)  10/19/2013    Past Surgical History:  Procedure Laterality Date  . ATRIAL FIBRILLATION ABLATION  02/26/2018   [facility]  . CATARACT EXTRACTION W/PHACO Left 11/08/2018   Procedure: CATARACT EXTRACTION PHACO AND INTRAOCULAR LENS PLACEMENT ([place]) LEFT;  Surgeon: Birder Robson, MD;  Location: [facility];  Service: Ophthalmology;  Laterality: Left;  sleep apnea not a [place] per [place]  . CATARACT EXTRACTION W/PHACO Right 11/29/2018   Procedure: CATARACT EXTRACTION PHACO AND INTRAOCULAR LENS PLACEMENT ([facility]) RIGHT  00:50.9  17.4%  8.88;  Surgeon: Birder Robson, MD;  Location: [facility];  Service: Ophthalmology;  Laterality: Right;  . ELECTROPHYSIOLOGIC STUDY N/A 10/09/2015   Procedure: Cardioversion;  Surgeon: Isaias Cowman, MD;  Location: [facility];  Service: Cardiovascular;  Laterality: N/A;  . NASAL RECONSTRUCTION WITH SEPTAL REPAIR    . REPLACEMENT TOTAL KNEE Right   . SHOULDER SURGERY Left   . TONSILLECTOMY         Home Medications    Prior to Admission medications   Medication Sig Start Date End Date Taking? Authorizing Provider  alprazolam Duanne Moron) 2 MG tablet Take 0.5 tablets (1 mg total) by mouth as needed. 09/30/17  Yes Jerrol Banana., MD  amLODipine (NORVASC) 5 MG tablet Take by mouth. 10/02/13  Yes [provider]  apixaban (ELIQUIS) 5 MG TABS tablet Take 5 mg by mouth 2 (two) times daily.   Yes [provider]  buPROPion (WELLBUTRIN XL) 300 MG 24  hr tablet TAKE 1 TABLET BY MOUTH DAILY 07/17/19  Yes Jerrol Banana., MD  Cholecalciferol (VITAMIN D) 2000 UNITS CAPS Take 1 capsule by mouth daily.  02/10/11  Yes [provider]  finasteride (PROSCAR) 5 MG tablet TAKE 1 TABLET BY MOUTH DAILY 01/01/20  Yes Jerrol Banana., MD  folic acid (FOLVITE) 222 MCG tablet Take 1 tablet (800 mcg total) by mouth daily. 12/27/19  Yes Jerrol Banana., MD  furosemide (LASIX) 20 MG tablet TAKE 1 TABLET (20 MG TOTAL) BY MOUTH ONCE  DAILY. TAKE AN ADDITIONAL TABLET ASNEEDED FOR WEIGHT GAIN GREATER THAN 2 POUNDS A NIGHT. 04/04/18  Yes [provider]  lisinopril (PRINIVIL,ZESTRIL) 5 MG tablet Take by mouth. 03/28/18  Yes [provider]  Melatonin 1 MG TABS Take 2 mg by mouth at bedtime.    Yes [provider]  metaxalone (SKELAXIN) 800 MG tablet Take 800 mg by mouth daily.    Yes [provider]  metoprolol succinate (TOPROL-XL) 50 MG 24 hr tablet TAKE 1 TABLET BY MOUTH DAILY. 10/27/17  Yes Jerrol Banana., MD  omeprazole (PRILOSEC) 20 MG capsule TAKE 1 CAPSULE BY MOUTH EVERY DAY. 07/27/17  Yes Jerrol Banana., MD  tamsulosin [facility]) 0.4 MG CAPS capsule TAKE 1 CAPSULE BY MOUTH DAILY 01/01/20  Yes Jerrol Banana., MD  temazepam (RESTORIL) 30 MG capsule TAKE 1 CAPSULE BY MOUTH EVERY DAY FOR SLEEP 01/24/20  Yes Jerrol Banana., MD  venlafaxine XR (EFFEXOR-XR) 75 MG 24 hr capsule TAKE 3 CAPSULES BY MOUTH EVERY MORNING 01/01/20  Yes Jerrol Banana., MD  doxycycline (VIBRAMYCIN) 100 MG capsule Take 1 capsule (100 mg total) by mouth 2 (two) times daily. Patient not taking: Reported on 12/27/2019 09/05/19   Coral Spikes, DO  HYDROcodone-homatropine [facility]) 5-1.5 MG/5ML syrup Take 5 mLs by mouth every 6 (six) hours as needed for cough. 09/05/19   Coral Spikes, DO  HYDROcodone-homatropine (HYCODAN) 5-1.5 MG/5ML syrup Take 5 mLs by mouth every 6 (six) hours as needed for cough. Patient not taking: Reported on 12/27/2019 09/27/19   Jerrol Banana., MD  spironolactone (ALDACTONE) 25 MG tablet Take by mouth. 03/17/18 05/31/19  [provider]    Family History Family History  Problem Relation Age of Onset  . Arthritis Mother   . Hypertension Mother   . Colon cancer Mother   . Other Mother        lymph node  . Diabetes Father   . Congestive Heart Failure Father   . Arthritis Sister     Social History Social History   Tobacco Use  . Smoking status:  Former Smoker    Types: Cigarettes    Quit date: 1974    Years since quitting: 47.8  . Smokeless tobacco: Never Used  . Tobacco comment: 1974  Vaping Use  . Vaping Use: Never used  Substance Use Topics  . Alcohol use: Yes    Alcohol/week: 0.0 - 2.0 standard drinks  . Drug use: No     Allergies   Sulfamethoxazole-trimethoprim   Review of Systems Review of Systems  Musculoskeletal: Negative for arthralgias and joint swelling.  Skin: Negative for color change, rash and wound.       Foreign wooden body under right middle fingernail  Neurological: Negative for weakness and numbness.     Physical Exam Triage Vital Signs ED Triage Vitals  Enc Vitals Group     BP 01/25/20  1001 139/68     Pulse Rate 01/25/20 1001 70     Resp 01/25/20 1001 18     Temp 01/25/20 1001 98.1 F (36.7 C)     Temp Source 01/25/20 1001 Oral     SpO2 01/25/20 1001 99 %     Weight 01/25/20 1002 232 lb (105.2 kg)     Height 01/25/20 1002 6\' 4"  (1.93 m)     Head Circumference --      Peak Flow --      Pain Score 01/25/20 1001 1     Pain Loc --      Pain Edu? --      Excl. in [place]? --    No data found.  Updated Vital Signs BP 139/68 (BP Location: Left Arm)   Pulse 70   Temp 98.1 F (36.7 C) (Oral)   Resp 18   Ht 6\' 4"  (1.93 m)   Wt 232 lb (105.2 kg)   SpO2 99%   BMI 28.24 kg/m       Physical Exam Vitals and nursing note reviewed.  Constitutional:      General: He is not in acute distress.    Appearance: Normal appearance. He is well-developed. He is not toxic-appearing.  HENT:     Head: Normocephalic and atraumatic.  Eyes:     General: No scleral icterus.    Conjunctiva/sclera: Conjunctivae normal.  Cardiovascular:     Rate and Rhythm: Normal rate.     Pulses: Normal pulses.  Pulmonary:     Effort: Pulmonary effort is normal. No respiratory distress.  Musculoskeletal:     Cervical back: Neck supple.  Skin:    General: Skin is warm and dry.     Comments: There is a large  wooden splinter under the right middle fingernail that goes all the way back to the cuticle.  No bleeding.  There is tenderness.  No swelling or ecchymosis.  Full range of motion finger.  Neurological:     General: No focal deficit present.     Mental Status: He is alert. Mental status is at baseline.     Motor: No weakness.     Gait: Gait normal.  Psychiatric:        Mood and Affect: Mood normal.        Behavior: Behavior normal.        Thought Content: Thought content normal.      UC Treatments / Results  Labs (all labs ordered are listed, but only abnormal results are displayed) Labs Reviewed - No data to display  EKG   Radiology No results found.  Procedures Foreign Body Removal  Date/Time: 01/25/2020 11:21 AM Performed by: Danton Clap, PA-C Authorized by: Danton Clap, PA-C   Consent:    Consent obtained:  Verbal   Consent given by:  Patient   Risks discussed:  Bleeding, infection, nerve damage, pain, poor cosmetic result and incomplete removal   Alternatives discussed:  No treatment, alternative treatment and observation Location:    Location:  Finger   Finger location:  R middle finger   Depth: under nail. Pre-procedure details:    Imaging:  None   Neurovascular status: intact   Anesthesia (see MAR for exact dosages):    Anesthesia method:  Nerve block   Block needle gauge:  27 G   Block anesthetic:  Lidocaine 1% w/o epi   Block technique:  Wing digital   Block injection procedure:  Anatomic landmarks identified, introduced needle and  negative aspiration for blood   Block outcome:  Anesthesia achieved Procedure type:    Procedure complexity:  Simple Procedure details:    Dissection of underlying tissues: no     Bloodless field: yes     Removal mechanism:  Forceps (also used scissors to cut part of nail away)   Foreign bodies recovered:  1   Description:  Wooden splinter 7 mm   Intact foreign body removal: yes   Post-procedure details:     Confirmation:  No additional foreign bodies on visualization   Skin closure:  None   Dressing:  Antibiotic ointment and non-adherent dressing   Patient tolerance of procedure:  Tolerated well, no immediate complications Comments:     Cleansed thoroughly with chlorhexidine before and after procedure   (including critical care time)  Medications Ordered in UC Medications - No data to display  Initial Impression / Assessment and Plan / UC Course  I have reviewed the triage vital signs and the nursing notes.  Pertinent labs & imaging results that were available during my care of the patient were reviewed by me and considered in my medical decision making (see chart for details).    Successful removal of entire wooden splinter.  Patient tolerated well.  Advised to look out for any signs of infection and follow-up if he sees any signs of infection or he has any pain.  Patient agreeable.   Final Clinical Impressions(s) / UC Diagnoses   Final diagnoses:  Splinter of finger   Discharge Instructions   None    ED Prescriptions    None     PDMP not reviewed this encounter.   Danton Clap, PA-C 01/25/20 1127

## 2020-01-25 NOTE — ED Triage Notes (Signed)
Patient in today stating he has a splinter under the nail of his right middle finger that happened last night.

## 2020-02-01 ENCOUNTER — Other Ambulatory Visit: Payer: Self-pay | Admitting: Family Medicine

## 2020-02-01 DIAGNOSIS — F32A Depression, unspecified: Secondary | ICD-10-CM

## 2020-02-20 ENCOUNTER — Encounter: Payer: Self-pay | Admitting: Family Medicine

## 2020-02-20 ENCOUNTER — Other Ambulatory Visit: Payer: Self-pay

## 2020-02-20 ENCOUNTER — Ambulatory Visit (INDEPENDENT_AMBULATORY_CARE_PROVIDER_SITE_OTHER): Payer: Medicare Other | Admitting: Family Medicine

## 2020-02-20 VITALS — BP 122/72 | HR 98 | Temp 99.8°F | Resp 16 | Wt 221.0 lb

## 2020-02-20 DIAGNOSIS — S81812A Laceration without foreign body, left lower leg, initial encounter: Secondary | ICD-10-CM | POA: Diagnosis not present

## 2020-02-20 DIAGNOSIS — L989 Disorder of the skin and subcutaneous tissue, unspecified: Secondary | ICD-10-CM | POA: Diagnosis not present

## 2020-02-20 NOTE — Progress Notes (Signed)
Established patient visit   Patient: Jeffrey French   DOB: 1950-06-08   69 y.o. Male  MRN: [id number] Visit Date: 02/20/2020  Today's healthcare provider: Lavon Paganini, MD   Chief Complaint  Patient presents with  . Skin Problem   Subjective    HPI  Patient here today C/O mole under left breast x several years. Patient reports changes in size and texture in the last few days.   Patient C/O wound on left leg not healing well. Present for approx 1 month. Using neosporin. Bleeds frequently.  Patient Active Problem List   Diagnosis Date Noted  . OSA (obstructive sleep apnea) 05/03/2018  . Acute on chronic systolic CHF (congestive heart failure) ([place]) 02/28/2018  . On amiodarone therapy 02/27/2018  . History of depression 02/26/2018  . Status post ablation of atrial fibrillation 02/17/2018  . Atrial fibrillation ([place]) 01/05/2018  . Heart palpitations 10/21/2017  . Diffuse cerebral atrophy ([place]) 03/20/2017  . SOB (shortness of breath) on exertion 03/19/2017  . New onset atrial flutter ([place]) 09/18/2015  . Irregular heart rhythm 09/17/2015  . Absolute anemia 02/18/2015  . Benign fibroma of prostate 02/18/2015  . Clinical depression 02/18/2015  . Abnormal prostate specific antigen 02/18/2015  . Essential (primary) hypertension 02/18/2015  . Anxiety, generalized 02/18/2015  . Insomnia 02/18/2015  . Arthritis, degenerative 02/18/2015  . Peptic ulcer 02/18/2015  . Elevated prostate specific antigen (PSA) 10/19/2013   Social History   Tobacco Use  . Smoking status: Former Smoker    Types: Cigarettes    Quit date: 1974    Years since quitting: 47.9  . Smokeless tobacco: Never Used  . Tobacco comment: 1974  Vaping Use  . Vaping Use: Never used  Substance Use Topics  . Alcohol use: Yes    Alcohol/week: 0.0 - 2.0 standard drinks  . Drug use: No   Allergies  Allergen Reactions  . Sulfamethoxazole-Trimethoprim Rash    (in mouth)       Medications: Outpatient  Medications Prior to Visit  Medication Sig  . alprazolam (XANAX) 2 MG tablet Take 0.5 tablets (1 mg total) by mouth as needed.  Marland Kitchen amLODipine (NORVASC) 5 MG tablet Take by mouth.  Marland Kitchen apixaban (ELIQUIS) 5 MG TABS tablet Take 5 mg by mouth 2 (two) times daily.  Marland Kitchen buPROPion (WELLBUTRIN XL) 300 MG 24 hr tablet TAKE 1 TABLET BY MOUTH DAILY  . Cholecalciferol (VITAMIN D) 2000 UNITS CAPS Take 1 capsule by mouth daily.   . finasteride (PROSCAR) 5 MG tablet TAKE 1 TABLET BY MOUTH DAILY  . folic acid (FOLVITE) 623 MCG tablet Take 1 tablet (800 mcg total) by mouth daily.  . furosemide (LASIX) 20 MG tablet TAKE 1 TABLET (20 MG TOTAL) BY MOUTH ONCE DAILY. TAKE AN ADDITIONAL TABLET ASNEEDED FOR WEIGHT GAIN GREATER THAN 2 POUNDS A NIGHT.  Marland Kitchen lisinopril (PRINIVIL,ZESTRIL) 5 MG tablet Take by mouth.  . Melatonin 1 MG TABS Take 2 mg by mouth at bedtime.   . metoprolol succinate (TOPROL-XL) 50 MG 24 hr tablet TAKE 1 TABLET BY MOUTH DAILY.  Marland Kitchen omeprazole (PRILOSEC) 20 MG capsule TAKE 1 CAPSULE BY MOUTH EVERY DAY.  . tamsulosin (FLOMAX) 0.4 MG CAPS capsule TAKE 1 CAPSULE BY MOUTH DAILY  . temazepam (RESTORIL) 30 MG capsule TAKE 1 CAPSULE BY MOUTH EVERY DAY FOR SLEEP  . venlafaxine XR (EFFEXOR-XR) 75 MG 24 hr capsule TAKE 3 CAPSULES BY MOUTH EVERY MORNING  . [DISCONTINUED] doxycycline (VIBRAMYCIN) 100 MG capsule Take 1 capsule (100  mg total) by mouth 2 (two) times daily. (Patient not taking: Reported on 12/27/2019)  . [DISCONTINUED] HYDROcodone-homatropine (HYCODAN) 5-1.5 MG/5ML syrup Take 5 mLs by mouth every 6 (six) hours as needed for cough.  . [DISCONTINUED] HYDROcodone-homatropine (HYCODAN) 5-1.5 MG/5ML syrup Take 5 mLs by mouth every 6 (six) hours as needed for cough. (Patient not taking: Reported on 12/27/2019)  . [DISCONTINUED] metaxalone (SKELAXIN) 800 MG tablet Take 800 mg by mouth daily.    No facility-administered medications prior to visit.    Review of Systems  Constitutional: Negative for activity  change, chills and fever.  Respiratory: Negative for cough and shortness of breath.   Cardiovascular: Negative for chest pain and palpitations.  Skin: Positive for color change and wound.  Neurological: Negative.   Psychiatric/Behavioral: Negative.     Last CBC Lab Results  Component Value Date   WBC 7.5 07/13/2019   HGB 13.4 07/13/2019   HCT 41.4 07/13/2019   MCV 89.2 07/13/2019   MCH 28.9 07/13/2019   RDW 14.2 07/13/2019   PLT 279 07/13/2019      Objective    BP 122/72 (BP Location: Left Arm, Patient Position: Sitting, Cuff Size: Normal)   Pulse 98   Temp 99.8 F (37.7 C) (Oral)   Resp 16   Wt 221 lb (100.2 kg)   SpO2 96%   BMI 26.90 kg/m  BP Readings from Last 3 Encounters:  02/20/20 122/72  01/25/20 139/68  12/27/19 118/69   Wt Readings from Last 3 Encounters:  02/20/20 221 lb (100.2 kg)  01/25/20 232 lb (105.2 kg)  12/27/19 222 lb 9.6 oz (101 kg)      Physical Exam Constitutional:      General: He is not in acute distress.    Appearance: Normal appearance. He is not diaphoretic.  HENT:     Head: Normocephalic and atraumatic.  Eyes:     Conjunctiva/sclera: Conjunctivae normal.  Cardiovascular:     Rate and Rhythm: Normal rate and regular rhythm.  Pulmonary:     Effort: Pulmonary effort is normal. No respiratory distress.  Skin:    General: Skin is warm and dry.     Comments: 1cm laceration on anterior L shin. No surrounding erythema. Approx 1.5cm raised discolored skin lesion on LUQ - see photo  Neurological:     Mental Status: He is alert and oriented to person, place, and time.  Psychiatric:        Mood and Affect: Mood normal.        Behavior: Behavior normal.           No results found for any visits on 02/20/20.  Assessment & Plan     1. Skin lesion - new problem - had a flat and normal mole and then 2 days ago seemed to enlarge and raise and get discolored - appears granulomatous vs BCC - referral to Derm for biopsy, eval, and  management - return precautions discussed - Ambulatory referral to Dermatology  2. Laceration of skin of left lower leg without complication, initial encounter - Benign small laceration. No need for sutures - TDAP UTD - keep clean and dry - use vaseline and bandage to protect while healing   Return if symptoms worsen or fail to improve.      I, Lavon Paganini, MD, have reviewed all documentation for this visit. The documentation on 02/20/20 for the exam, diagnosis, procedures, and orders are all accurate and complete.   Evelise Reine, Dionne Bucy, MD, MPH [facility]

## 2020-02-20 NOTE — Patient Instructions (Signed)
Laceration Care, Adult A laceration is a cut that may go through all layers of the skin. The cut may also go into the tissue that is right under the skin. Some cuts heal on their own. Others need to be closed with stitches (sutures), staples, skin adhesive strips, or skin glue. Taking care of your injury lowers your risk of infection, helps your injury to heal better, and may prevent scarring. Supplies needed:  Soap.  Water.  Hand sanitizer.  Bandage (dressing).  Antibiotic ointment.  Clean towel. How to take care of your cut Wash your hands with soap and water before touching your wound or changing your bandage. If soap and water are not available, use hand sanitizer. If your doctor used stitches or staples:  Keep the wound clean and dry.  If you were given a bandage, change it at least once a day as told by your doctor. You should also change it if it gets wet or dirty.  Keep the wound completely dry for the first 24 hours, or as told by your doctor. After that, you may take a shower or a bath. Do not get the wound soaked in water until after the stitches or staples have been removed.  Clean the wound once a day, or as told by your doctor: ? Wash the wound with soap and water. ? Rinse the wound with water to remove all soap. ? Pat the wound dry with a clean towel. Do not rub the wound.  After you clean the wound, put a thin layer of antibiotic ointment on it as told by your doctor. This ointment: ? Helps to prevent infection. ? Keeps the bandage from sticking to the wound.  Have your stitches or staples removed as told by your doctor. If your doctor used skin adhesive strips:  Keep the wound clean and dry.  If you were given a bandage, you should change it at least once a day as told by your doctor. You should also change it if it gets wet or dirty.  Do not get the skin adhesive strips wet. You can take a shower or a bath, but keep the wound dry.  If the wound gets wet,  pat it dry with a clean towel. Do not rub the wound.  Skin adhesive strips fall off on their own. You can trim the strips as the wound heals. Do not remove any strips that are still stuck to the wound. They will fall off after a while. If your doctor used skin glue:  Try to keep your wound dry, but you may briefly wet it in the shower or bath. Do not soak the wound in water, such as by swimming.  After you take a shower or a bath, gently pat the wound dry with a clean towel. Do not rub the wound.  Do not do any activities that will make you really sweaty until the skin glue has fallen off on its own.  Do not apply liquid, cream, or ointment medicine to your wound while the skin glue is still on.  If you were given a bandage, you should change it at least once a day or as told by your doctor. You should also change it if it gets dirty or wet.  If a bandage is placed over the wound, do not let the tape touch the skin glue.  Do not pick at the glue. The skin glue usually stays on for 5-10 days. Then, it falls off the skin. General   instructions   Take over-the-counter and prescription medicines only as told by your doctor.  If you were given antibiotic medicine or ointment, take or apply it as told by your doctor. Do not stop using it even if your condition improves.  Do not scratch or pick at the wound.  Check your wound every day for signs of infection. Watch for: ? Redness, swelling, or pain. ? Fluid, blood, or pus.  Raise (elevate) the injured area above the level of your heart while you are sitting or lying down.  If directed, put ice on the affected area: ? Put ice in a plastic bag. ? Place a towel between your skin and the bag. ? Leave the ice on for 20 minutes, 2-3 times a day.  Prevent scarring by covering your wound with sunscreen of at least 30 SPF whenever you are outside after your wound has healed.  Keep all follow-up visits as told by your doctor. This is  important. Get help if:  You got a tetanus shot and you have any of these problems at the injection site: ? Swelling. ? Very bad pain. ? Redness. ? Bleeding.  You have a fever.  A wound that was closed breaks open.  You notice a bad smell coming from your wound or your bandage.  You notice something coming out of the wound, such as wood or glass.  Medicine does not relieve your pain.  You have more redness, swelling, or pain at the site of your wound.  You have fluid, blood, or pus coming from your wound.  You notice a change in the color of your skin near your wound.  You need to change the bandage often because fluid, blood, or pus is coming from the wound.  You start to have a new rash.  You start to have numbness around the wound. Get help right away if:  You have very bad swelling around the wound.  Your pain suddenly gets worse and is very bad.  You notice painful lumps near the wound or anywhere on your body.  You have a red streak going away from your wound.  The wound is on your hand or foot, and: ? You cannot move a finger or toe. ? Your fingers or toes look pale or bluish. Summary  A laceration is a cut that may go through all layers of the skin. The cut may also go into the tissue right under the skin.  Some cuts heal on their own. Others need to be closed with stitches, staples, skin adhesive strips, or skin glue.  Follow your doctor's instructions for caring for your cut. Proper care of a cut lowers the risk of infection, helps the cut heal better, and prevents scarring. This information is not intended to replace advice given to you by your health care provider. Make sure you discuss any questions you have with your health care provider. Document Revised: 05/21/2017 Document Reviewed: 04/12/2017 Elsevier Patient Education  [employer].

## 2020-02-26 ENCOUNTER — Telehealth: Payer: Self-pay | Admitting: *Deleted

## 2020-02-26 NOTE — Telephone Encounter (Signed)
Copied from [place] [id number]. Topic: General - Call Back - No Documentation >> Feb 26, 2020 10:57 AM Erick Blinks wrote: Reason for CRM: Pt called to request a call back regarding his dermatology referral. The soonest they can see him is January 4th, 2022. He wants sooner appt, please advise. And contact if you need updated photograph of region. He has history with [facility].  Best contact: [phone]

## 2020-02-27 NOTE — Telephone Encounter (Signed)
Checking with referral.

## 2020-02-27 NOTE — Telephone Encounter (Signed)
FYI

## 2020-02-27 NOTE — Telephone Encounter (Signed)
I have left messages for pt to return call. Pt has an appointment at [facility] to see Dr Laurence Ferrari 02/28/20 at 9:15 due to a cancellation. He will need to cancel appointment at [facility]

## 2020-02-27 NOTE — Telephone Encounter (Signed)
LMOVM for pt to return call. Parke Poisson was able to get patient an appointment with  Skin for tomorrow at 9:00 am.

## 2020-02-28 ENCOUNTER — Ambulatory Visit (INDEPENDENT_AMBULATORY_CARE_PROVIDER_SITE_OTHER): Payer: Medicare Other | Admitting: Dermatology

## 2020-02-28 ENCOUNTER — Other Ambulatory Visit: Payer: Self-pay

## 2020-02-28 ENCOUNTER — Encounter: Payer: Self-pay | Admitting: Dermatology

## 2020-02-28 DIAGNOSIS — D2361 Other benign neoplasm of skin of right upper limb, including shoulder: Secondary | ICD-10-CM

## 2020-02-28 DIAGNOSIS — D239 Other benign neoplasm of skin, unspecified: Secondary | ICD-10-CM

## 2020-02-28 DIAGNOSIS — L820 Inflamed seborrheic keratosis: Secondary | ICD-10-CM | POA: Diagnosis not present

## 2020-02-28 DIAGNOSIS — L821 Other seborrheic keratosis: Secondary | ICD-10-CM

## 2020-02-28 DIAGNOSIS — L578 Other skin changes due to chronic exposure to nonionizing radiation: Secondary | ICD-10-CM

## 2020-02-28 DIAGNOSIS — L814 Other melanin hyperpigmentation: Secondary | ICD-10-CM

## 2020-02-28 DIAGNOSIS — L570 Actinic keratosis: Secondary | ICD-10-CM | POA: Diagnosis not present

## 2020-02-28 DIAGNOSIS — D229 Melanocytic nevi, unspecified: Secondary | ICD-10-CM

## 2020-02-28 DIAGNOSIS — L739 Follicular disorder, unspecified: Secondary | ICD-10-CM

## 2020-02-28 NOTE — Progress Notes (Signed)
   New Patient Visit  Subjective  Jeffrey French is a 69 y.o. male who presents for the following: Skin Problem (Patient c/o of mole that has changed recently. Has been there for years. Now larger, inflamed, irritated. ).  Objective  Well appearing patient in no apparent distress; mood and affect are within normal limits.  Review of Systems: No other skin or systemic complaints except as noted in HPI or Assessment and Plan.  All skin waist up examined.  Objective  Left Abdomen (side) - Upper x1: Erythematous keratotic stuck-on plaque.   Objective  Right Superior Helix x1, left nasofacial angle x1, left nasal dorsum x1 (3): Erythematous thin papules/macules with gritty scale.   Objective  Chest - Medial [facility]): Erythematous follicular based papules  Objective  Right Wrist - Posterior: Favor Blue Nevus  Blue-gray lesion  Assessment & Plan  Inflamed seborrheic keratosis Left Abdomen (side) - Upper x1  Prior to procedure, discussed risks of blister formation, small wound, skin dyspigmentation, or rare scar following cryotherapy.    Destruction of lesion - Left Abdomen (side) - Upper x1  Destruction method: cryotherapy   Informed consent: discussed and consent obtained   Lesion destroyed using liquid nitrogen: Yes   Outcome: patient tolerated procedure well with no complications   Post-procedure details: wound care instructions given    AK (actinic keratosis) (3) Right Superior Helix x1, left nasofacial angle x1, left nasal dorsum x1  Prior to procedure, discussed risks of blister formation, small wound, skin dyspigmentation, or rare scar following cryotherapy.    Destruction of lesion - Right Superior Helix x1, left nasofacial angle x1, left nasal dorsum x1  Destruction method: cryotherapy   Informed consent: discussed and consent obtained   Lesion destroyed using liquid nitrogen: Yes   Outcome: patient tolerated procedure well with no complications     Post-procedure details: wound care instructions given    Folliculitis Chest - Medial (Center)  Observe.  Recheck at follow-up. Call if any changes occur before scheduled follow-up.    Blue nevus Right Wrist - Posterior  Benign-appearing.  Observation.  Call clinic for new or changing moles.  Recommend daily use of broad spectrum spf 30+ sunscreen to sun-exposed areas.    Seborrheic Keratoses - Stuck-on, waxy, tan-brown papules and plaques  - Discussed benign etiology and prognosis. - Observe - Call for any changes  Lentigines - Scattered tan macules - Discussed due to sun exposure - Benign, observe - Call for any changes  Recommend taking Heliocare supplement when out in sun/during summer.  Actinic Damage - chronic, secondary to cumulative UV radiation exposure/sun exposure over time - diffuse scaly erythematous macules with underlying dyspigmentation - Recommend daily broad spectrum sunscreen SPF 30+ to sun-exposed areas, reapply every 2 hours as needed.  - Call for new or changing lesions.  Melanocytic Nevi - Tan-brown and/or pink-flesh-colored symmetric macules and papules - Benign appearing on exam today - Observation - Call clinic for new or changing moles - Recommend daily use of broad spectrum spf 30+ sunscreen to sun-exposed areas.     Return in about 6 weeks (around 04/10/2020) for AK and ISK recheck.   I, Emelia Salisbury, CMA, am acting as scribe for Forest Gleason, MD.  Documentation: I have reviewed the above documentation for accuracy and completeness, and I agree with the above.  Forest Gleason, MD

## 2020-02-28 NOTE — Telephone Encounter (Signed)
Patient did arrive for appt today with Dermatology.

## 2020-02-28 NOTE — Patient Instructions (Addendum)
Cryotherapy Aftercare  . Wash gently with soap and water everyday.   Marland Kitchen Apply Vaseline and Band-Aid daily until healed.  Prior to procedure, discussed risks of blister formation, small wound, skin dyspigmentation, or rare scar following cryotherapy.   Seborrheic Keratosis  What causes seborrheic keratoses? Seborrheic keratoses are harmless, common skin growths that first appear during adult life.  As time goes by, more growths appear.  Some people may develop a large number of them.  Seborrheic keratoses appear on both covered and uncovered body parts.  They are not caused by sunlight.  The tendency to develop seborrheic keratoses can be inherited.  They vary in color from skin-colored to gray, brown, or even black.  They can be either smooth or have a rough, warty surface.   Seborrheic keratoses are superficial and look as if they were stuck on the skin.  Under the microscope this type of keratosis looks like layers upon layers of skin.  That is why at times the top layer may seem to fall off, but the rest of the growth remains and re-grows.    Treatment Seborrheic keratoses do not need to be treated, but can easily be removed in the office.  Seborrheic keratoses often cause symptoms when they rub on clothing or jewelry.  Lesions can be in the way of shaving.  If they become inflamed, they can cause itching, soreness, or burning.  Removal of a seborrheic keratosis can be accomplished by freezing, burning, or surgery. If any spot bleeds, scabs, or grows rapidly, please return to have it checked, as these can be an indication of a skin cancer.  Recommend taking Heliocare sun protection supplement daily in sunny weather for additional sun protection. For maximum protection on the sunniest days, you can take up to 2 capsules of regular Heliocare OR take 1 capsule of Heliocare Ultra. For prolonged exposure (such as a full day in the sun), you can repeat your dose of the supplement 4 hours after your  first dose. Heliocare can be purchased at [facility] or at [email or web address].   Recommend daily broad spectrum sunscreen SPF 30+ to sun-exposed areas, reapply every 2 hours as needed. Call for new or changing lesions.

## 2020-03-04 ENCOUNTER — Encounter: Payer: Self-pay | Admitting: Dermatology

## 2020-04-16 ENCOUNTER — Ambulatory Visit (INDEPENDENT_AMBULATORY_CARE_PROVIDER_SITE_OTHER): Payer: Medicare Other | Admitting: Dermatology

## 2020-04-16 ENCOUNTER — Other Ambulatory Visit: Payer: Self-pay

## 2020-04-16 DIAGNOSIS — L821 Other seborrheic keratosis: Secondary | ICD-10-CM | POA: Diagnosis not present

## 2020-04-16 DIAGNOSIS — Z872 Personal history of diseases of the skin and subcutaneous tissue: Secondary | ICD-10-CM

## 2020-04-16 DIAGNOSIS — L578 Other skin changes due to chronic exposure to nonionizing radiation: Secondary | ICD-10-CM | POA: Diagnosis not present

## 2020-04-16 DIAGNOSIS — L570 Actinic keratosis: Secondary | ICD-10-CM | POA: Diagnosis not present

## 2020-04-16 DIAGNOSIS — L905 Scar conditions and fibrosis of skin: Secondary | ICD-10-CM | POA: Diagnosis not present

## 2020-04-16 DIAGNOSIS — D485 Neoplasm of uncertain behavior of skin: Secondary | ICD-10-CM | POA: Diagnosis not present

## 2020-04-16 HISTORY — DX: Actinic keratosis: L57.0

## 2020-04-16 NOTE — Patient Instructions (Signed)

## 2020-04-16 NOTE — Progress Notes (Signed)
   Follow-Up Visit   Subjective  Jeffrey French is a 70 y.o. male who presents for the following: Actinic Keratosis (Recheck for any new or persistent skin lesions - R sup helix, L nasofacial angle, L nasal dorsum) and inflamed seborrheic keratosis (Recheck for persistence - L abdomen ).   The following portions of the chart were reviewed this encounter and updated as appropriate:   Tobacco  Allergies  Meds  Problems  Med Hx  Surg Hx  Fam Hx      Review of Systems:  No other skin or systemic complaints except as noted in HPI or Assessment and Plan.  Objective  Well appearing patient in no apparent distress; mood and affect are within normal limits.  A focused examination was performed including the face and abdomen. Relevant physical exam findings are noted in the Assessment and Plan.  Objective  L upper abdomen: 0.9 cm scaly pink papule      Objective  R sup helix, L nasofacial angle, L nasal dorsum: Clear.  Assessment & Plan  Neoplasm of uncertain behavior of skin L upper abdomen  Skin / nail biopsy Type of biopsy: tangential   Informed consent: discussed and consent obtained   Timeout: patient name, date of birth, surgical site, and procedure verified   Procedure prep:  Patient was prepped and draped in usual sterile fashion Prep type:  Isopropyl alcohol Anesthesia: the lesion was anesthetized in a standard fashion   Anesthetic:  1% lidocaine w/ epinephrine 1-100,000 buffered w/ 8.4% NaHCO3 Instrument used: flexible razor blade   Hemostasis achieved with: aluminum chloride   Outcome: patient tolerated procedure well   Post-procedure details: wound care instructions given   Additional details:  Petrolatum and a pressure bandage applied  Specimen 1 - Surgical pathology Differential Diagnosis: D48.5 r/o SCC vs other  Check Margins: No 0.9 cm scaly pink papule  History of actinic keratosis R sup helix, L nasofacial angle, L nasal dorsum  Clear. Observe for  recurrence. Call clinic for new or changing lesions.  Recommend regular skin exams, daily broad-spectrum spf 30+ sunscreen use, and photoprotection.      Actinic Damage - chronic, secondary to cumulative UV radiation exposure/sun exposure over time - diffuse scaly erythematous macules with underlying dyspigmentation - Recommend daily broad spectrum sunscreen SPF 30+ to sun-exposed areas, reapply every 2 hours as needed.  - Call for new or changing lesions.  Seborrheic Keratoses - Stuck-on, waxy, tan-brown papules and plaques  - Discussed benign etiology and prognosis. - Observe - Call for any changes  Return in about 10 months (around 02/14/2021) for TBSE.  Luther Redo, CMA, am acting as scribe for Forest Gleason, MD .  Documentation: I have reviewed the above documentation for accuracy and completeness, and I agree with the above.  Forest Gleason, MD

## 2020-04-17 ENCOUNTER — Encounter: Payer: Self-pay | Admitting: Dermatology

## 2020-04-23 NOTE — Progress Notes (Signed)
Skin , l upper abdomen HYPERTROPHIC ACTINIC KERATOSIS WITH FEATURES OF A VERRUCA OVERLYING INFLAMED DERMAL SCAR  This is a precancerous spot with some wart virus. A small portion of these spots will turn into squamous cell skin cancer with time.  However, we are not sure which spots will progress to skin cancer and which ones will stay the same so we typically recommend treating them.  For this reason, I would recommend that we treat it with liquid nitrogen in clinic in the next 2 months.  MAs please call and schedule for follow-up as needed. Thank you!

## 2020-04-25 ENCOUNTER — Telehealth: Payer: Self-pay

## 2020-04-25 NOTE — Telephone Encounter (Signed)
Patient advised bx results show AK. Scheduled for LN2 06/11/20, JS

## 2020-04-25 NOTE — Telephone Encounter (Signed)
-----   Message from Alfonso Patten, MD sent at 04/23/2020 12:24 PM EST ----- Skin , l upper abdomen HYPERTROPHIC ACTINIC KERATOSIS WITH FEATURES OF A VERRUCA OVERLYING INFLAMED DERMAL SCAR  This is a precancerous spot with some wart virus. A small portion of these spots will turn into squamous cell skin cancer with time.  However, we are not sure which spots will progress to skin cancer and which ones will stay the same so we typically recommend treating them.  For this reason, I would recommend that we treat it with liquid nitrogen in clinic in the next 2 months.  MAs please call and schedule for follow-up as needed. Thank you!

## 2020-05-30 DIAGNOSIS — R0981 Nasal congestion: Secondary | ICD-10-CM | POA: Diagnosis not present

## 2020-05-30 DIAGNOSIS — J0100 Acute maxillary sinusitis, unspecified: Secondary | ICD-10-CM | POA: Diagnosis not present

## 2020-06-04 DIAGNOSIS — H903 Sensorineural hearing loss, bilateral: Secondary | ICD-10-CM | POA: Diagnosis not present

## 2020-06-05 DIAGNOSIS — J329 Chronic sinusitis, unspecified: Secondary | ICD-10-CM | POA: Diagnosis not present

## 2020-06-11 ENCOUNTER — Ambulatory Visit (INDEPENDENT_AMBULATORY_CARE_PROVIDER_SITE_OTHER): Payer: Medicare Other | Admitting: Dermatology

## 2020-06-11 ENCOUNTER — Other Ambulatory Visit: Payer: Self-pay

## 2020-06-11 ENCOUNTER — Encounter: Payer: Self-pay | Admitting: Dermatology

## 2020-06-11 DIAGNOSIS — L570 Actinic keratosis: Secondary | ICD-10-CM | POA: Diagnosis not present

## 2020-06-11 NOTE — Progress Notes (Signed)
   Follow-Up Visit   Subjective  Jeffrey French is a 70 y.o. male who presents for the following: Follow-up (Biopsy proven Ak L upper Abdomen pt here for treatment ).   The following portions of the chart were reviewed this encounter and updated as appropriate:   Tobacco  Allergies  Meds  Problems  Med Hx  Surg Hx  Fam Hx      Review of Systems:  No other skin or systemic complaints except as noted in HPI or Assessment and Plan.  Objective  Well appearing patient in no apparent distress; mood and affect are within normal limits.  A focused examination was performed including L upper abdomenn . Relevant physical exam findings are noted in the Assessment and Plan.  Objective  Left Abdomen (side) - Upper: Erythematous thin papules/macules with gritty scale.    Assessment & Plan  AK (actinic keratosis) Left Abdomen (side) - Upper  Biopsy proven Ak   Prior to procedure, discussed risks of blister formation, small wound, skin dyspigmentation, or rare scar following cryotherapy.    Destruction of lesion - Left Abdomen (side) - Upper Complexity: simple   Destruction method: cryotherapy   Informed consent: discussed and consent obtained   Timeout:  patient name, date of birth, surgical site, and procedure verified Lesion destroyed using liquid nitrogen: Yes   Region frozen until ice ball extended beyond lesion: Yes   Outcome: patient tolerated procedure well with no complications   Post-procedure details: wound care instructions given    Return for as scheduled Nov 2022 for TBSE .  I, Marye Round, CMA, am acting as scribe for Forest Gleason, MD .  Documentation: I have reviewed the above documentation for accuracy and completeness, and I agree with the above.  Forest Gleason, MD

## 2020-06-11 NOTE — Patient Instructions (Signed)
Cryotherapy Aftercare  . Wash gently with soap and water everyday.   . Apply Vaseline and Band-Aid daily until healed.    Recommend daily broad spectrum sunscreen SPF 30+ to sun-exposed areas, reapply every 2 hours as needed. Call for new or changing lesions.  Staying in the shade or wearing long sleeves, sun glasses (UVA+UVB protection) and wide brim hats (4-inch brim around the entire circumference of the hat) are also recommended for sun protection.    

## 2020-06-21 ENCOUNTER — Other Ambulatory Visit: Payer: Self-pay | Admitting: Family Medicine

## 2020-06-21 NOTE — Telephone Encounter (Signed)
Requested medication (s) are due for refill today:   Provider to review  Requested medication (s) are on the active medication list:   Yes  Future visit scheduled:   Yes   Last ordered: 01/24/2020 #30, 3 refills  Non delegated refill   Requested Prescriptions  Pending Prescriptions Disp Refills   temazepam (RESTORIL) 30 MG capsule [Pharmacy Med Name: TEMAZEPAM 30 MG CAP] 30 capsule     Sig: TAKE 1 CAPSULE BY MOUTH EVERY DAY FOR SLEEP      Not Delegated - Psychiatry:  Anxiolytics/Hypnotics Failed - 06/21/2020  1:52 PM      Failed - This refill cannot be delegated      Failed - Urine Drug Screen completed in last 360 days      Passed - Valid encounter within last 6 months    Recent Outpatient Visits           4 months ago Skin lesion   [facility], Dionne Bucy, MD   5 months ago Benign prostatic hyperplasia, unspecified whether lower urinary tract symptoms present   [facility] Jerrol Banana., MD   8 months ago Cough   [facility] Jerrol Banana., MD   1 year ago Essential (primary) hypertension   [facility] Jerrol Banana., MD   1 year ago Unsteady gait   [facility] Jerrol Banana., MD       Future Appointments             In 1 month Jerrol Banana., MD [facility] [place]

## 2020-06-27 ENCOUNTER — Ambulatory Visit: Payer: Self-pay | Admitting: Family Medicine

## 2020-07-05 ENCOUNTER — Ambulatory Visit
Admission: EM | Admit: 2020-07-05 | Discharge: 2020-07-05 | Disposition: A | Discharge status: Home / Self Care | Payer: Medicare Other | Attending: Sports Medicine | Admitting: Sports Medicine

## 2020-07-05 ENCOUNTER — Encounter: Payer: Self-pay | Admitting: Emergency Medicine

## 2020-07-05 ENCOUNTER — Other Ambulatory Visit: Payer: Self-pay

## 2020-07-05 DIAGNOSIS — S60457A Superficial foreign body of left little finger, initial encounter: Secondary | ICD-10-CM | POA: Diagnosis not present

## 2020-07-05 DIAGNOSIS — S60551A Superficial foreign body of right hand, initial encounter: Secondary | ICD-10-CM | POA: Diagnosis not present

## 2020-07-05 DIAGNOSIS — L089 Local infection of the skin and subcutaneous tissue, unspecified: Secondary | ICD-10-CM | POA: Diagnosis not present

## 2020-07-05 DIAGNOSIS — M79641 Pain in right hand: Secondary | ICD-10-CM

## 2020-07-05 DIAGNOSIS — M79645 Pain in left finger(s): Secondary | ICD-10-CM

## 2020-07-05 DIAGNOSIS — S60451A Superficial foreign body of left index finger, initial encounter: Secondary | ICD-10-CM | POA: Diagnosis not present

## 2020-07-05 MED ORDER — CEPHALEXIN 500 MG PO CAPS: 500.0000 mg | ORAL_CAPSULE | Freq: Four times a day (QID) | ORAL | 0 refills | Status: DC

## 2020-07-05 NOTE — ED Provider Notes (Signed)
[facility]    CSN: [id number] Arrival date & time: 07/05/20  0934      History   Chief Complaint Chief Complaint  Patient presents with  . Hand Injury    right    HPI Jeffrey French is a 70 y.o. male.   Patient is a pleasant 70 year old right-hand-dominant male who presents for evaluation of potential foreign body in 2 places of his hands.  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  He says he has lots of calluses, usually gets splinters that are usually would put can be metallic at times.  He reports getting a splinter in his right palm about a week ago.  He has been picking at it and try to get it out but has been unsuccessful.  It has started to swell a little bit and he has a little bit of redness around that area.  There has been no significant abscess or pus expressed.  He also has a small sliver that has gone under the left fifth finger nail.  He has been trying to pick at that but has not been able to get any foreign body out.  He has tried supportive care but given that it is still bothering him he comes in today for initial evaluation in the urgent care.  He denies any fever shakes chills.  No nausea vomiting diarrhea.  Normally sees Dr. Miguel Aschoff at [facility] family practice for his ongoing medical care.     Past Medical History:  Diagnosis Date  . A-fib ([place])    resolved with ablation  . Actinic keratosis 04/16/2020   L upper abdomen - bx proven   . BPH (benign prostatic hyperplasia)   . GERD (gastroesophageal reflux disease)   . Hypertension   . Numbness of foot    Bilateral. since knee replacements  . Sleep apnea    moderate.  unable to tolerate CPAP    Patient Active Problem List   Diagnosis Date Noted  . OSA (obstructive sleep apnea) 05/03/2018  . Acute on chronic systolic CHF (congestive heart failure) ([place]) 02/28/2018  . On amiodarone therapy 02/27/2018  . History of depression 02/26/2018  . Status post ablation of atrial  fibrillation 02/17/2018  . Atrial fibrillation ([place]) 01/05/2018  . Heart palpitations 10/21/2017  . Diffuse cerebral atrophy ([place]) 03/20/2017  . SOB (shortness of breath) on exertion 03/19/2017  . New onset atrial flutter ([place]) 09/18/2015  . Irregular heart rhythm 09/17/2015  . Absolute anemia 02/18/2015  . Benign fibroma of prostate 02/18/2015  . Clinical depression 02/18/2015  . Abnormal prostate specific antigen 02/18/2015  . Essential (primary) hypertension 02/18/2015  . Anxiety, generalized 02/18/2015  . Insomnia 02/18/2015  . Arthritis, degenerative 02/18/2015  . Peptic ulcer 02/18/2015  . Elevated prostate specific antigen (PSA) 10/19/2013    Past Surgical History:  Procedure Laterality Date  . ATRIAL FIBRILLATION ABLATION  02/26/2018   [facility]  . CATARACT EXTRACTION W/PHACO Left 11/08/2018   Procedure: CATARACT EXTRACTION PHACO AND INTRAOCULAR LENS PLACEMENT ([place]) LEFT;  Surgeon: Birder Robson, MD;  Location: [place];  Service: Ophthalmology;  Laterality: Left;  sleep apnea not a [place] per [place]  . CATARACT EXTRACTION W/PHACO Right 11/29/2018   Procedure: CATARACT EXTRACTION PHACO AND INTRAOCULAR LENS PLACEMENT ([facility]) RIGHT  00:50.9  17.4%  8.88;  Surgeon: Birder Robson, MD;  Location: [place];  Service: Ophthalmology;  Laterality: Right;  . ELECTROPHYSIOLOGIC STUDY N/A 10/09/2015   Procedure: Cardioversion;  Surgeon:  Isaias Cowman, MD;  Location: [facility];  Service: Cardiovascular;  Laterality: N/A;  . NASAL RECONSTRUCTION WITH SEPTAL REPAIR    . REPLACEMENT TOTAL KNEE Right   . SHOULDER SURGERY Left   . TONSILLECTOMY         Home Medications    Prior to Admission medications   Medication Sig Start Date End Date Taking? Authorizing Provider  amLODipine (NORVASC) 5 MG tablet Take by mouth. 10/02/13  Yes [provider]  apixaban (ELIQUIS) 5 MG TABS tablet Take 5 mg by mouth 2 (two) times daily.   Yes [provider]   buPROPion (WELLBUTRIN XL) 300 MG 24 hr tablet TAKE 1 TABLET BY MOUTH DAILY 02/01/20  Yes Jerrol Banana., MD  cephALEXin (KEFLEX) 500 MG capsule Take 1 capsule (500 mg total) by mouth 4 (four) times daily. 07/05/20  Yes Verda Cumins, MD  Cholecalciferol (VITAMIN D) 2000 UNITS CAPS Take 1 capsule by mouth daily.  02/10/11  Yes [provider]  finasteride (PROSCAR) 5 MG tablet TAKE 1 TABLET BY MOUTH DAILY 01/01/20  Yes Jerrol Banana., MD  folic acid (FOLVITE) 425 MCG tablet Take 1 tablet (800 mcg total) by mouth daily. 12/27/19  Yes Jerrol Banana., MD  furosemide (LASIX) 20 MG tablet TAKE 1 TABLET (20 MG TOTAL) BY MOUTH ONCE DAILY. TAKE AN ADDITIONAL TABLET ASNEEDED FOR WEIGHT GAIN GREATER THAN 2 POUNDS A NIGHT. 04/04/18  Yes [provider]  lisinopril (PRINIVIL,ZESTRIL) 5 MG tablet Take by mouth. 03/28/18  Yes [provider]  Melatonin 1 MG TABS Take 2 mg by mouth at bedtime.    Yes [provider]  metoprolol succinate (TOPROL-XL) 50 MG 24 hr tablet TAKE 1 TABLET BY MOUTH DAILY. 10/27/17  Yes Jerrol Banana., MD  omeprazole (PRILOSEC) 20 MG capsule TAKE 1 CAPSULE BY MOUTH EVERY DAY. 07/27/17  Yes Jerrol Banana., MD  tamsulosin [facility]) 0.4 MG CAPS capsule TAKE 1 CAPSULE BY MOUTH DAILY 01/01/20  Yes Jerrol Banana., MD  temazepam (RESTORIL) 30 MG capsule TAKE 1 CAPSULE BY MOUTH EVERY DAY FOR SLEEP 06/24/20  Yes Jerrol Banana., MD  venlafaxine XR (EFFEXOR-XR) 75 MG 24 hr capsule TAKE 3 CAPSULES BY MOUTH EVERY MORNING 01/01/20  Yes Jerrol Banana., MD  alprazolam Duanne Moron) 2 MG tablet Take 0.5 tablets (1 mg total) by mouth as needed. 09/30/17   Jerrol Banana., MD  spironolactone (ALDACTONE) 25 MG tablet Take by mouth. 03/17/18 05/31/19  [provider]    Family History Family History  Problem Relation Age of Onset  . Arthritis Mother   . Hypertension Mother   . Colon cancer Mother   . Other  Mother        lymph node  . Diabetes Father   . Congestive Heart Failure Father   . Arthritis Sister     Social History Social History   Tobacco Use  . Smoking status: Former Smoker    Types: Cigarettes    Quit date: 1974    Years since quitting: 48.2  . Smokeless tobacco: Never Used  . Tobacco comment: 1974  Vaping Use  . Vaping Use: Never used  Substance Use Topics  . Alcohol use: Yes    Alcohol/week: 0.0 - 2.0 standard drinks  . Drug use: No     Allergies   Sulfamethoxazole-trimethoprim   Review of Systems Review of Systems  Constitutional: Negative.  Negative for activity change, appetite change,  chills, diaphoresis, fatigue and fever.  HENT: Negative.  Negative for congestion, rhinorrhea and sore throat.   Eyes: Negative.  Negative for pain.  Respiratory: Negative.  Negative for cough, chest tightness, shortness of breath, wheezing and stridor.   Cardiovascular: Negative.  Negative for chest pain and palpitations.  Gastrointestinal: Negative.  Negative for abdominal pain, constipation, diarrhea, nausea and vomiting.  Genitourinary: Negative.  Negative for dysuria.  Musculoskeletal: Positive for arthralgias. Negative for myalgias.  Skin: Positive for wound. Negative for color change, pallor and rash.  Neurological: Negative.  Negative for dizziness, tremors, syncope, weakness, light-headedness, numbness and headaches.  Hematological: Negative.  Negative for adenopathy. Does not bruise/bleed easily.  All other systems reviewed and are negative.    Physical Exam Triage Vital Signs ED Triage Vitals  Enc Vitals Group     BP 07/05/20 0953 131/61     Pulse Rate 07/05/20 0953 75     Resp 07/05/20 0953 16     Temp 07/05/20 0953 98 F (36.7 C)     Temp Source 07/05/20 0953 Oral     SpO2 07/05/20 0953 98 %     Weight 07/05/20 0951 235 lb (106.6 kg)     Height 07/05/20 0951 6\' 3"  (1.905 m)     Head Circumference --      Peak Flow --      Pain Score 07/05/20 0951  10     Pain Loc --      Pain Edu? --      Excl. in [place]? --    No data found.  Updated Vital Signs BP 131/61 (BP Location: Left Arm)   Pulse 75   Temp 98 F (36.7 C) (Oral)   Resp 16   Ht 6\' 3"  (1.905 m)   Wt 106.6 kg   SpO2 98%   BMI 29.37 kg/m   Visual Acuity Right Eye Distance:   Left Eye Distance:   Bilateral Distance:    Right Eye Near:   Left Eye Near:    Bilateral Near:     Physical Exam Vitals and nursing note reviewed.  Constitutional:      General: He is not in acute distress.    Appearance: Normal appearance. He is not ill-appearing, toxic-appearing or diaphoretic.  HENT:     Head: Normocephalic and atraumatic.     Mouth/Throat:     Mouth: Mucous membranes are moist.     Pharynx: No oropharyngeal exudate or posterior oropharyngeal erythema.  Eyes:     General: No scleral icterus.       Right eye: No discharge.        Left eye: No discharge.     Extraocular Movements: Extraocular movements intact.     Conjunctiva/sclera: Conjunctivae normal.     Pupils: Pupils are equal, round, and reactive to light.  Cardiovascular:     Rate and Rhythm: Normal rate and regular rhythm.     Pulses: Normal pulses.     Heart sounds: Normal heart sounds. No murmur heard. No friction rub. No gallop.   Pulmonary:     Effort: Pulmonary effort is normal. No respiratory distress.     Breath sounds: Normal breath sounds. No stridor. No wheezing, rhonchi or rales.  Musculoskeletal:     Cervical back: Normal range of motion and neck supple.  Skin:    General: Skin is warm.     Capillary Refill: Capillary refill takes less than 2 seconds.     Comments: Right hand: Patient has a 5  mm round lesion in the palm of his hand most notably over the first metacarpal.  Its on the palmar surface.  When I expressed that it is tender.  There is some mild yellow drainage.  I cannot appreciate a foreign body.  There is some overlying soft tissue swelling and some mild erythema.  Remainder of the  hand exam is within normal limits. Left hand: Patient has a little bit of erythema at the tip of his fifth finger.  There is a small black foreign body lesion visualized just under the nail.  No significant erythema.  There is no evidence of an abscess.  No drainage.  Neurological:     General: No focal deficit present.     Mental Status: He is alert and oriented to person, place, and time.      UC Treatments / Results  Labs (all labs ordered are listed, but only abnormal results are displayed) Labs Reviewed - No data to display  EKG   Radiology No results found.  Procedures Foreign Body Removal  Date/Time: 07/05/2020 11:39 AM Performed by: Verda Cumins, MD Authorized by: Verda Cumins, MD   Consent:    Consent obtained:  Verbal   Consent given by:  Patient   Risks, benefits, and alternatives were discussed: yes     Risks discussed:  Bleeding, infection, incomplete removal, pain, poor cosmetic result, nerve damage and worsening of condition   Alternatives discussed:  No treatment Universal protocol:    Procedure explained and questions answered to patient or proxy's satisfaction: yes   Location:    Location:  Finger   Finger location:  L little finger   Depth:  Subcutaneous   Tendon involvement:  None Pre-procedure details:    Imaging:  None Anesthesia:    Anesthesia method:  Nerve block   Block anesthetic:  Lidocaine 1% w/o epi   Block outcome:  Anesthesia achieved Procedure type:    Procedure complexity:  Simple Procedure details:    Localization method:  Visualized   Dissection of underlying tissues: no     Bloodless field: yes     Removal mechanism:  Hemostat   Foreign bodies recovered:  None Post-procedure details:    Neurovascular status: intact     Confirmation:  No additional foreign bodies on visualization   Skin closure:  None   Dressing:  Open (no dressing)   Procedure completion:  Tolerated well, no immediate complications Comments:      Similar procedure performed on the right hand in the palm with similar results.   (including critical care time)  Medications Ordered in UC Medications - No data to display  Initial Impression / Assessment and Plan / UC Course  I have reviewed the triage vital signs and the nursing notes.  Pertinent labs & imaging results that were available during my care of the patient were reviewed by me and considered in my medical decision making (see chart for details).  Clinical impression: 1.  Sliver in the right palm.  I was able to express some yellowish pustule material.  There is some overlying swelling and mild erythema.  We will treat accordingly. 2.  Sliver in the right fifth finger at the tip under the nailbed.  It can be visualized.  Treatment plan: 1.  The findings and treatment plan were discussed in detail with the patient.  Patient was in agreement. 2.  It appears as though is a foreign body in 2 places.  He is developing a superficial infection in  the right palm.  We will place him on antibiotics.  Gave him Keflex 500 mg 4 times a day for a week. 3.  In regards to his left fifth finger we went ahead and did a digital block I was able to take out several pieces of what appeared to be black wood consistent with a sliver.  He tolerated this without incident.  Please see the procedure note above. 4.  Recommended warm compresses with hot water to try to bring any pus or foreign bodies to the surface.  Asked him not to squeeze or pick at any of these areas. 5.  I want him to monitor for any fevers or significant redness or warmth to the area.  If that happens she should come back here, see Dr. Rozell Searing, or go to the ER.  He voiced verbal understanding. 6.  Educational handouts provided. 7.  Follow-up here as needed.    Final Clinical Impressions(s) / UC Diagnoses   Final diagnoses:  Right hand pain  Pain of finger of left hand  Superficial foreign body of right hand, initial encounter   Foreign body of left little finger     Discharge Instructions     You have been diagnosed with a foreign body in your left fifth finger as well as your right palm.  There was some pustular drainage and came out of the lesion on your right palm.  Even though you are not having a fever, I am treating you for a superficial infection.  Monitor your progress on the antibiotics.  Please pick it up from your pharmacy. I have provided educational handouts. Please soak your hands in warm to hot water and try to bring some of the pus as well as any foreign bodies to the surface.  Please try not to squeeze and pick at those areas as you may make the infection worse. If you have any further problems you are welcome to come back here, see Dr. Rosanna Randy, or if you develop any worsening symptoms including fever or redness around the area please go to the ER.  I hope you get to feeling better, Dr. Drema Dallas    ED Prescriptions    Medication Sig Dispense Auth. Provider   cephALEXin (KEFLEX) 500 MG capsule Take 1 capsule (500 mg total) by mouth 4 (four) times daily. 28 capsule Verda Cumins, MD     PDMP not reviewed this encounter.   Verda Cumins, MD 07/08/20 [phone]

## 2020-07-05 NOTE — Discharge Instructions (Signed)
You have been diagnosed with a foreign body in your left fifth finger as well as your right palm.  There was some pustular drainage and came out of the lesion on your right palm.  Even though you are not having a fever, I am treating you for a superficial infection.  Monitor your progress on the antibiotics.  Please pick it up from your pharmacy. I have provided educational handouts. Please soak your hands in warm to hot water and try to bring some of the pus as well as any foreign bodies to the surface.  Please try not to squeeze and pick at those areas as you may make the infection worse. If you have any further problems you are welcome to come back here, see Dr. Rosanna Randy, or if you develop any worsening symptoms including fever or redness around the area please go to the ER.  I hope you get to feeling better, Dr. Drema Dallas

## 2020-07-05 NOTE — ED Triage Notes (Signed)
Patient states that he has a splinter stuck in the palm of his right hand and in his left 5th finger for a week.

## 2020-07-08 DIAGNOSIS — J309 Allergic rhinitis, unspecified: Secondary | ICD-10-CM | POA: Diagnosis not present

## 2020-07-08 DIAGNOSIS — J301 Allergic rhinitis due to pollen: Secondary | ICD-10-CM | POA: Diagnosis not present

## 2020-07-08 DIAGNOSIS — J329 Chronic sinusitis, unspecified: Secondary | ICD-10-CM | POA: Diagnosis not present

## 2020-07-08 DIAGNOSIS — H43813 Vitreous degeneration, bilateral: Secondary | ICD-10-CM | POA: Diagnosis not present

## 2020-07-31 ENCOUNTER — Other Ambulatory Visit: Payer: Self-pay

## 2020-07-31 ENCOUNTER — Encounter: Payer: Self-pay | Admitting: Family Medicine

## 2020-07-31 ENCOUNTER — Ambulatory Visit (INDEPENDENT_AMBULATORY_CARE_PROVIDER_SITE_OTHER): Payer: Medicare Other | Admitting: Family Medicine

## 2020-07-31 VITALS — BP 120/66 | HR 60 | Temp 98.0°F | Resp 16 | Ht 75.0 in | Wt 229.0 lb

## 2020-07-31 DIAGNOSIS — G3184 Mild cognitive impairment, so stated: Secondary | ICD-10-CM

## 2020-07-31 DIAGNOSIS — R202 Paresthesia of skin: Secondary | ICD-10-CM

## 2020-07-31 DIAGNOSIS — R739 Hyperglycemia, unspecified: Secondary | ICD-10-CM

## 2020-07-31 DIAGNOSIS — G629 Polyneuropathy, unspecified: Secondary | ICD-10-CM

## 2020-07-31 DIAGNOSIS — F3341 Major depressive disorder, recurrent, in partial remission: Secondary | ICD-10-CM

## 2020-07-31 DIAGNOSIS — E7800 Pure hypercholesterolemia, unspecified: Secondary | ICD-10-CM | POA: Diagnosis not present

## 2020-07-31 DIAGNOSIS — I10 Essential (primary) hypertension: Secondary | ICD-10-CM

## 2020-07-31 DIAGNOSIS — G4733 Obstructive sleep apnea (adult) (pediatric): Secondary | ICD-10-CM | POA: Diagnosis not present

## 2020-07-31 DIAGNOSIS — I482 Chronic atrial fibrillation, unspecified: Secondary | ICD-10-CM

## 2020-07-31 DIAGNOSIS — N400 Enlarged prostate without lower urinary tract symptoms: Secondary | ICD-10-CM | POA: Diagnosis not present

## 2020-07-31 NOTE — Progress Notes (Signed)
I,[name],acting as a scribe for [name], MD.,have documented all relevant documentation on the behalf of [name], MD,as directed by  [name], MD while in the presence of [name], MD.   Established patient visit   Patient: Jeffrey French   DOB: 03-23-1951   70 y.o. Male  MRN: [id number] Visit Date: 07/31/2020  Today's healthcare provider: [name], MD   Chief Complaint  Patient presents with  . Follow-up  . Prediabetes   Subjective    HPI  Overall patient states he is doing well and he actually looks the best of seen him look in a couple of years.  He has no complaints today other than chronic neuropathy and tingling which is not really worsening much. Prediabetes, Follow-up  Lab Results  Component Value Date   HGBA1C 5.8 (H) 01/02/2020   GLUCOSE 105 (H) 07/13/2019   GLUCOSE 98 05/19/2018   GLUCOSE 100 (H) 01/05/2018    Last seen for for this 7 months ago.  Management since that visit includes; no changes were made at this visit. Current symptoms include none and have been unchanged.  Prior visit with dietician: no Current diet: well balanced Current exercise: none  Pertinent Labs:    Component Value Date/Time   CHOL 226 (H) 07/13/2019 0956   TRIG 133 07/13/2019 0956   CHOLHDL 3.9 07/13/2019 0956   CREATININE 1.30 (H) 07/13/2019 0956    Wt Readings from Last 3 Encounters:  07/31/20 229 lb (103.9 kg)  07/05/20 235 lb (106.6 kg)  02/20/20 221 lb (100.2 kg)    -------------------------------------------------------------------- Depression, Follow-up  He  was last seen for this 7 months ago. Changes made at last visit include; Patient states he is clinically stable at this point.   He reports good compliance with treatment. He is not having side effects. none  He reports good tolerance of treatment. Current symptoms include: fatigue He feels he is Unchanged since last visit.  Depression screen  [facility] 2/9 07/31/2020 12/27/2019 05/19/2018  Decreased Interest 0 0 1  Down, Depressed, Hopeless 0 0 1  PHQ - 2 Score 0 0 2  Altered sleeping 0 1 -  Tired, decreased energy 1 0 -  Change in appetite 0 0 -  Feeling bad or failure about yourself  0 0 -  Trouble concentrating 0 0 -  Moving slowly or fidgety/restless 0 0 -  Suicidal thoughts 0 0 -  PHQ-9 Score 1 1 -  Difficult doing work/chores Not difficult at all Not difficult at all -    -------------------------------------------------------------------  Prostate nodule From 12/27/2019-Patient declines urology referral presently, if PSA is elevated would definitely refer to urology.  Chronic atrial fibrillation (HCC) From 12/27/2019-On Eliquis  MCI (mild cognitive impairment) From 12/27/2019-MMSE on next visit.        Medications: Outpatient Medications Prior to Visit  Medication Sig  . alprazolam (XANAX) 2 MG tablet Take 0.5 tablets (1 mg total) by mouth as needed.  Marland Kitchen amLODipine (NORVASC) 5 MG tablet Take by mouth.  Marland Kitchen apixaban (ELIQUIS) 5 MG TABS tablet Take 5 mg by mouth 2 (two) times daily.  Marland Kitchen buPROPion (WELLBUTRIN XL) 300 MG 24 hr tablet TAKE 1 TABLET BY MOUTH DAILY  . Cholecalciferol (VITAMIN D) 2000 UNITS CAPS Take 1 capsule by mouth daily.   . finasteride (PROSCAR) 5 MG tablet TAKE 1 TABLET BY MOUTH DAILY  . folic acid (FOLVITE) 384 MCG tablet Take 1 tablet (800 mcg total)  by mouth daily.  . furosemide (LASIX) 20 MG tablet TAKE 1 TABLET (20 MG TOTAL) BY MOUTH ONCE DAILY. TAKE AN ADDITIONAL TABLET ASNEEDED FOR WEIGHT GAIN GREATER THAN 2 POUNDS A NIGHT.  Marland Kitchen lisinopril (PRINIVIL,ZESTRIL) 5 MG tablet Take by mouth.  . Melatonin 1 MG TABS Take 2 mg by mouth at bedtime.   . metoprolol succinate (TOPROL-XL) 50 MG 24 hr tablet TAKE 1 TABLET BY MOUTH DAILY.  Marland Kitchen omeprazole (PRILOSEC) 20 MG capsule TAKE 1 CAPSULE BY MOUTH EVERY DAY.  . tamsulosin (FLOMAX) 0.4 MG CAPS capsule TAKE 1 CAPSULE BY MOUTH DAILY  . temazepam (RESTORIL) 30 MG  capsule TAKE 1 CAPSULE BY MOUTH EVERY DAY FOR SLEEP  . venlafaxine XR (EFFEXOR-XR) 75 MG 24 hr capsule TAKE 3 CAPSULES BY MOUTH EVERY MORNING  . cephALEXin (KEFLEX) 500 MG capsule Take 1 capsule (500 mg total) by mouth 4 (four) times daily. (Patient not taking: Reported on 07/31/2020)   No facility-administered medications prior to visit.    Review of Systems  Constitutional: Negative for appetite change, chills and fever.  Respiratory: Negative for chest tightness, shortness of breath and wheezing.   Cardiovascular: Negative for chest pain and palpitations.  Gastrointestinal: Negative for abdominal pain, nausea and vomiting.        Objective    BP 120/66 (BP Location: Left Arm, Patient Position: Sitting, Cuff Size: Large)   Pulse 60   Temp 98 F (36.7 C) (Oral)   Resp 16   Ht 6\' 3"  (1.905 m)   Wt 229 lb (103.9 kg)   SpO2 99%   BMI 28.62 kg/m  BP Readings from Last 3 Encounters:  07/31/20 120/66  07/05/20 131/61  02/20/20 122/72   Wt Readings from Last 3 Encounters:  07/31/20 229 lb (103.9 kg)  07/05/20 235 lb (106.6 kg)  02/20/20 221 lb (100.2 kg)       Physical Exam    No results found for any visits on 07/31/20.  Assessment & Plan     1. Hyperglycemia Follow-up A1c with goal less than 6.5.  Follow-up 6 months. - Hemoglobin A1c  2. Recurrent major depressive disorder, in partial remission (HCC) Lifelong bupropion and Effexor has had this for the last 20 to 25 year - TSH  3. Benign prostatic hyperplasia, unspecified whether lower urinary tract symptoms present   4. OSA (obstructive sleep apnea)   5. Chronic atrial fibrillation (HCC) On Eliquis  6. MCI (mild cognitive impairment) MMSE on next visit.  His cognition appears to be clear today.  7. Essential (primary) hypertension Excellent control on Prinivil and Toprol and amlodipine - CBC w/Diff/Platelet - Comprehensive Metabolic Panel (CMET)  8. Pure hypercholesterolemia  - Lipid panel - TSH -  Comprehensive Metabolic Panel (CMET)  9. Neuropathy Try alpha lipoic acid - Vitamin B12 - Folate  10. Tingling  - Vitamin B12 - Folate   Return in about 6 months (around 01/30/2021).      I, [name], MD, have reviewed all documentation for this visit. The documentation on 08/06/20 for the exam, diagnosis, procedures, and orders are all accurate and complete.    [place] Jakubiak Cranford Mon, MD  [facility] [phone] (phone) [phone] (fax)  [place]

## 2020-08-01 ENCOUNTER — Other Ambulatory Visit: Payer: Self-pay | Admitting: Family Medicine

## 2020-08-01 NOTE — Telephone Encounter (Signed)
Requested medication (s) are due for refill today:  Provider to review  Requested medication (s) are on the active medication list:   Yes  Future visit scheduled:   No but was seen yesterday 4/27   Last ordered: 06/24/2020 #30, 0 refills  Non delegated refill   Requested Prescriptions  Pending Prescriptions Disp Refills   temazepam (RESTORIL) 30 MG capsule [Pharmacy Med Name: TEMAZEPAM 30 MG CAP] 30 capsule     Sig: TAKE 1 CAPSULE BY MOUTH EVERY DAY FOR SLEEP      Not Delegated - Psychiatry:  Anxiolytics/Hypnotics Failed - 08/01/2020 12:16 PM      Failed - This refill cannot be delegated      Failed - Urine Drug Screen completed in last 360 days      Passed - Valid encounter within last 6 months    Recent Outpatient Visits           Yesterday Hyperglycemia   [facility] Jerrol Banana., MD   5 months ago Skin lesion   [facility], Dionne Bucy, MD   7 months ago Benign prostatic hyperplasia, unspecified whether lower urinary tract symptoms present   [facility] Jerrol Banana., MD   10 months ago Cough   [facility] Jerrol Banana., MD   1 year ago Essential (primary) hypertension   [facility] Jerrol Banana., MD

## 2020-08-02 ENCOUNTER — Other Ambulatory Visit
Admission: RE | Admit: 2020-08-02 | Discharge: 2020-08-02 | Disposition: A | Discharge status: Home / Self Care | Payer: Medicare Other | Attending: Family Medicine | Admitting: Family Medicine

## 2020-08-02 DIAGNOSIS — N400 Enlarged prostate without lower urinary tract symptoms: Secondary | ICD-10-CM | POA: Diagnosis not present

## 2020-08-02 DIAGNOSIS — E7800 Pure hypercholesterolemia, unspecified: Secondary | ICD-10-CM | POA: Insufficient documentation

## 2020-08-02 DIAGNOSIS — Z7901 Long term (current) use of anticoagulants: Secondary | ICD-10-CM | POA: Diagnosis not present

## 2020-08-02 DIAGNOSIS — R739 Hyperglycemia, unspecified: Secondary | ICD-10-CM | POA: Insufficient documentation

## 2020-08-02 DIAGNOSIS — G629 Polyneuropathy, unspecified: Secondary | ICD-10-CM | POA: Insufficient documentation

## 2020-08-02 DIAGNOSIS — I482 Chronic atrial fibrillation, unspecified: Secondary | ICD-10-CM | POA: Insufficient documentation

## 2020-08-02 DIAGNOSIS — F339 Major depressive disorder, recurrent, unspecified: Secondary | ICD-10-CM | POA: Insufficient documentation

## 2020-08-02 DIAGNOSIS — R202 Paresthesia of skin: Secondary | ICD-10-CM | POA: Diagnosis not present

## 2020-08-02 DIAGNOSIS — G4733 Obstructive sleep apnea (adult) (pediatric): Secondary | ICD-10-CM | POA: Diagnosis not present

## 2020-08-02 LAB — COMPREHENSIVE METABOLIC PANEL
ALT: 20 U/L (ref 0–44)
AST: 22 U/L (ref 15–41)
Albumin: 3.8 g/dL (ref 3.5–5.0)
Alkaline Phosphatase: 56 U/L (ref 38–126)
Anion gap: 6 (ref 5–15)
BUN: 24 mg/dL — ABNORMAL HIGH (ref 8–23)
CO2: 27 mmol/L (ref 22–32)
Calcium: 9.3 mg/dL (ref 8.9–10.3)
Chloride: 108 mmol/L (ref 98–111)
Creatinine, Ser: 1.1 mg/dL (ref 0.61–1.24)
GFR, Estimated: >60 mL/min (ref >60)
Glucose, Bld: 104 mg/dL — ABNORMAL HIGH (ref 70–99)
Potassium: 4.6 mmol/L (ref 3.5–5.1)
Sodium: 141 mmol/L (ref 135–145)
Total Bilirubin: 1.2 mg/dL (ref 0.3–1.2)
Total Protein: 7.1 g/dL (ref 6.5–8.1)

## 2020-08-02 LAB — HEMOGLOBIN A1C
Hgb A1c MFr Bld: 5.9 % — ABNORMAL HIGH (ref 4.8–5.6)
Mean Plasma Glucose: 122.63 mg/dL

## 2020-08-02 LAB — CBC WITH DIFFERENTIAL/PLATELET
Abs Immature Granulocytes: 0.02 10*3/uL (ref 0.00–0.07)
Basophils Absolute: 0.1 10*3/uL (ref 0.0–0.1)
Basophils Relative: 1 %
Eosinophils Absolute: 0.8 10*3/uL — ABNORMAL HIGH (ref 0.0–0.5)
Eosinophils Relative: 12 %
HCT: 41.6 % (ref 39.0–52.0)
Hemoglobin: 13.5 g/dL (ref 13.0–17.0)
Immature Granulocytes: 0 %
Lymphocytes Relative: 30 %
Lymphs Abs: 2 10*3/uL (ref 0.7–4.0)
MCH: 28.5 pg (ref 26.0–34.0)
MCHC: 32.5 g/dL (ref 30.0–36.0)
MCV: 87.8 fL (ref 80.0–100.0)
Monocytes Absolute: 0.5 10*3/uL (ref 0.1–1.0)
Monocytes Relative: 8 %
Neutro Abs: 3.2 10*3/uL (ref 1.7–7.7)
Neutrophils Relative %: 49 %
Platelets: 263 10*3/uL (ref 150–400)
RBC: 4.74 MIL/uL (ref 4.22–5.81)
RDW: 13.3 % (ref 11.5–15.5)
WBC: 6.6 10*3/uL (ref 4.0–10.5)
nRBC: 0 % (ref 0.0–0.2)

## 2020-08-02 LAB — LIPID PANEL
Cholesterol: 221 mg/dL — ABNORMAL HIGH (ref 0–200)
HDL: 56 mg/dL (ref >40)
LDL Cholesterol: 136 mg/dL — ABNORMAL HIGH (ref 0–99)
Total CHOL/HDL Ratio: 3.9 RATIO
Triglycerides: 144 mg/dL (ref <150)
VLDL: 29 mg/dL (ref 0–40)

## 2020-08-02 LAB — VITAMIN B12: Vitamin B-12: 336 pg/mL (ref 180–914)

## 2020-08-02 LAB — TSH: TSH: 1.668 u[IU]/mL (ref 0.350–4.500)

## 2020-08-02 LAB — FOLATE: Folate: 48 ng/mL (ref >5.9)

## 2020-08-06 ENCOUNTER — Other Ambulatory Visit: Payer: Self-pay | Admitting: Family Medicine

## 2020-08-06 NOTE — Telephone Encounter (Signed)
Requested medication (s) are due for refill today: ordered today  Requested medication (s) are on the active medication list: yes  Last refill:  08/06/20 #30 5 refills  Future visit scheduled: no  Notes to clinic:  not delegated per protocol     Requested Prescriptions  Pending Prescriptions Disp Refills   temazepam (RESTORIL) 30 MG capsule [Pharmacy Med Name: TEMAZEPAM 30 MG CAP] 30 capsule     Sig: TAKE 1 CAPSULE BY MOUTH EVERY DAY FOR SLEEP      Not Delegated - Psychiatry:  Anxiolytics/Hypnotics Failed - 08/06/2020 10:33 AM      Failed - This refill cannot be delegated      Failed - Urine Drug Screen completed in last 360 days      Passed - Valid encounter within last 6 months    Recent Outpatient Visits           6 days ago Hyperglycemia   [facility] Jerrol Banana., MD   5 months ago Skin lesion   [facility] [place], Dionne Bucy, MD   7 months ago Benign prostatic hyperplasia, unspecified whether lower urinary tract symptoms present   [facility] Jerrol Banana., MD   10 months ago Cough   [facility] Jerrol Banana., MD   1 year ago Essential (primary) hypertension   [facility] Jerrol Banana., MD

## 2020-09-05 ENCOUNTER — Other Ambulatory Visit: Payer: Self-pay | Admitting: Family Medicine

## 2020-09-05 DIAGNOSIS — F32A Depression, unspecified: Secondary | ICD-10-CM

## 2020-10-12 ENCOUNTER — Ambulatory Visit (INDEPENDENT_AMBULATORY_CARE_PROVIDER_SITE_OTHER): Payer: Medicare Other

## 2020-10-12 ENCOUNTER — Encounter: Payer: Self-pay | Admitting: Emergency Medicine

## 2020-10-12 ENCOUNTER — Ambulatory Visit
Admission: EM | Admit: 2020-10-12 | Discharge: 2020-10-12 | Disposition: A | Discharge status: Home / Self Care | Payer: Medicare Other | Attending: Emergency Medicine | Admitting: Emergency Medicine

## 2020-10-12 ENCOUNTER — Other Ambulatory Visit: Payer: Self-pay

## 2020-10-12 DIAGNOSIS — M7989 Other specified soft tissue disorders: Secondary | ICD-10-CM

## 2020-10-12 DIAGNOSIS — M79641 Pain in right hand: Secondary | ICD-10-CM

## 2020-10-12 DIAGNOSIS — W19XXXA Unspecified fall, initial encounter: Secondary | ICD-10-CM

## 2020-10-12 NOTE — Discharge Instructions (Addendum)
You do not have any fractures.  Please purchase over-the-counter Voltaren gel and apply to the knuckles to help with swelling.  Also try to ice this area every couple of hours and keep it elevated.  You can take Tylenol for pain relief for your at home tramadol if needed.  Once the swelling starts to go down if you continue to have pain you may need to see Ortho.  If the swelling does not go down and you are continue to have pain you still need to see Ortho.  You could have an underlying tendon or ligament issue which is difficult to evaluate right now due to your swelling and guarding since it is painful.  You may have a condition requiring you to follow up with Orthopedics so please call one of the following office for appointment:   [facility] [address] [place], [place] [address] Phone: [phone]  [facility] [address] [place], [place] [address] Phone: [phone]

## 2020-10-12 NOTE — ED Provider Notes (Signed)
[facility]    CSN: [id number] Arrival date & time: 10/12/20  1307      History   Chief Complaint Chief Complaint  Patient presents with   Fall   Hand Pain    right    HPI Jeffrey French is a 70 y.o. male presenting for right hand pain since a fall last night.  Patient states that he accidentally slipped outside and his hand hit the ground.  He has some swelling and pain of the hand that has not improved.  He denies any numbness or tingling.  Denies weakness.  States that it is uncomfortable to try to make a fist.  Patient concerned about possible fracture or other significant condition.  He denies any known arthritis in his hand.  He has not taken any medication for the pain and denies applying ice to the area.  He denies any other injury sustained in the fall.  He denies head injury or loss of consciousness.  No other complaints or concerns.  HPI  Past Medical History:  Diagnosis Date   A-fib [facility])    resolved with ablation   Actinic keratosis 04/16/2020   L upper abdomen - bx proven    BPH (benign prostatic hyperplasia)    GERD (gastroesophageal reflux disease)    Hypertension    Numbness of foot    Bilateral. since knee replacements   Sleep apnea    moderate.  unable to tolerate CPAP    Patient Active Problem List   Diagnosis Date Noted   OSA (obstructive sleep apnea) 05/03/2018   Acute on chronic systolic CHF (congestive heart failure) ([place]) 02/28/2018   On amiodarone therapy 02/27/2018   History of depression 02/26/2018   Status post ablation of atrial fibrillation 02/17/2018   Atrial fibrillation ([place]) 01/05/2018   Heart palpitations 10/21/2017   Diffuse cerebral atrophy ([place]) 03/20/2017   SOB (shortness of breath) on exertion 03/19/2017   New onset atrial flutter ([place]) 09/18/2015   Irregular heart rhythm 09/17/2015   Absolute anemia 02/18/2015   Benign fibroma of prostate 02/18/2015   Clinical depression 02/18/2015   Abnormal prostate specific  antigen 02/18/2015   Essential (primary) hypertension 02/18/2015   Anxiety, generalized 02/18/2015   Insomnia 02/18/2015   Arthritis, degenerative 02/18/2015   Peptic ulcer 02/18/2015   Elevated prostate specific antigen (PSA) 10/19/2013    Past Surgical History:  Procedure Laterality Date   ATRIAL FIBRILLATION ABLATION  02/26/2018   [facility]   CATARACT EXTRACTION W/PHACO Left 11/08/2018   Procedure: CATARACT EXTRACTION PHACO AND INTRAOCULAR LENS PLACEMENT ([place]) LEFT;  Surgeon: Birder Robson, MD;  Location: [place];  Service: Ophthalmology;  Laterality: Left;  sleep apnea not a [place] per [place]   CATARACT EXTRACTION W/PHACO Right 11/29/2018   Procedure: CATARACT EXTRACTION PHACO AND INTRAOCULAR LENS PLACEMENT ([facility]) RIGHT  00:50.9  17.4%  8.88;  Surgeon: Birder Robson, MD;  Location: [place];  Service: Ophthalmology;  Laterality: Right;   ELECTROPHYSIOLOGIC STUDY N/A 10/09/2015   Procedure: Cardioversion;  Surgeon: Isaias Cowman, MD;  Location: [facility];  Service: Cardiovascular;  Laterality: N/A;   NASAL RECONSTRUCTION WITH SEPTAL REPAIR     REPLACEMENT TOTAL KNEE Right    SHOULDER SURGERY Left    TONSILLECTOMY         Home Medications    Prior to Admission medications   Medication Sig Start Date End Date Taking? Authorizing Provider  amLODipine (NORVASC) 5 MG tablet Take by mouth. 10/02/13  Yes [provider]  apixaban (ELIQUIS) 5 MG TABS tablet Take 5 mg by mouth 2 (two) times daily.   Yes [provider]  buPROPion (WELLBUTRIN XL) 300 MG 24 hr tablet TAKE 1 TABLET BY MOUTH DAILY 09/05/20  Yes Jerrol Banana., MD  Cholecalciferol (VITAMIN D) 2000 UNITS CAPS Take 1 capsule by mouth daily.  02/10/11  Yes [provider]  finasteride (PROSCAR) 5 MG tablet TAKE 1 TABLET BY MOUTH DAILY 01/01/20  Yes Jerrol Banana., MD  folic acid (FOLVITE) 878 MCG tablet Take 1 tablet (800 mcg total) by mouth daily. 12/27/19  Yes  Jerrol Banana., MD  furosemide (LASIX) 20 MG tablet TAKE 1 TABLET (20 MG TOTAL) BY MOUTH ONCE DAILY. TAKE AN ADDITIONAL TABLET ASNEEDED FOR WEIGHT GAIN GREATER THAN 2 POUNDS A NIGHT. 04/04/18  Yes [provider]  lisinopril (PRINIVIL,ZESTRIL) 5 MG tablet Take by mouth. 03/28/18  Yes [provider]  Melatonin 1 MG TABS Take 2 mg by mouth at bedtime.    Yes [provider]  metoprolol succinate (TOPROL-XL) 50 MG 24 hr tablet TAKE 1 TABLET BY MOUTH DAILY. 10/27/17  Yes Jerrol Banana., MD  omeprazole (PRILOSEC) 20 MG capsule TAKE 1 CAPSULE BY MOUTH EVERY DAY. 07/27/17  Yes Jerrol Banana., MD  tamsulosin [facility]) 0.4 MG CAPS capsule TAKE 1 CAPSULE BY MOUTH DAILY 01/01/20  Yes Jerrol Banana., MD  temazepam (RESTORIL) 30 MG capsule TAKE 1 CAPSULE BY MOUTH EVERY DAY FOR SLEEP 08/07/20  Yes Jerrol Banana., MD  venlafaxine XR (EFFEXOR-XR) 75 MG 24 hr capsule TAKE 3 CAPSULES BY MOUTH EVERY MORNING 01/01/20  Yes Jerrol Banana., MD  alprazolam Duanne Moron) 2 MG tablet Take 0.5 tablets (1 mg total) by mouth as needed. 09/30/17   Jerrol Banana., MD  cephALEXin (KEFLEX) 500 MG capsule Take 1 capsule (500 mg total) by mouth 4 (four) times daily. Patient not taking: No sig reported 07/05/20   Verda Cumins, MD  spironolactone (ALDACTONE) 25 MG tablet Take by mouth. 03/17/18 05/31/19  [provider]    Family History Family History  Problem Relation Age of Onset   Arthritis Mother    Hypertension Mother    Colon cancer Mother    Other Mother        lymph node   Diabetes Father    Congestive Heart Failure Father    Arthritis Sister     Social History Social History   Tobacco Use   Smoking status: Former    Pack years: 0.00    Types: Cigarettes    Quit date: 1974    Years since quitting: 48.5   Smokeless tobacco: Never   Tobacco comments:    1974  Vaping Use   Vaping Use: Never used  Substance Use Topics   Alcohol  use: Yes    Alcohol/week: 0.0 - 2.0 standard drinks   Drug use: No     Allergies   Sulfamethoxazole-trimethoprim   Review of Systems Review of Systems  Musculoskeletal:  Positive for arthralgias and joint swelling.  Skin:  Negative for color change, rash and wound.  Neurological:  Negative for weakness and numbness.    Physical Exam Triage Vital Signs ED Triage Vitals  Enc Vitals Group     BP 10/12/20 1325 137/78     Pulse Rate 10/12/20 1325 99     Resp 10/12/20 1325 18     Temp 10/12/20 1325 98.4 F (36.9 C)  Temp Source 10/12/20 1325 Oral     SpO2 10/12/20 1325 98 %     Weight 10/12/20 1326 229 lb 0.9 oz (103.9 kg)     Height 10/12/20 1326 6\' 3"  (1.905 m)     Head Circumference --      Peak Flow --      Pain Score 10/12/20 1325 7     Pain Loc --      Pain Edu? --      Excl. in [place]? --    No data found.  Updated Vital Signs BP 137/78 (BP Location: Left Arm)   Pulse 99   Temp 98.4 F (36.9 C) (Oral)   Resp 18   Ht 6\' 3"  (1.905 m)   Wt 229 lb 0.9 oz (103.9 kg)   SpO2 98%   BMI 28.63 kg/m    Physical Exam Vitals and nursing note reviewed.  Constitutional:      General: He is not in acute distress.    Appearance: Normal appearance. He is well-developed. He is not ill-appearing.  HENT:     Head: Normocephalic and atraumatic.  Eyes:     General: No scleral icterus.    Extraocular Movements: Extraocular movements intact.     Conjunctiva/sclera: Conjunctivae normal.     Pupils: Pupils are equal, round, and reactive to light.  Cardiovascular:     Rate and Rhythm: Normal rate and regular rhythm.     Pulses: Normal pulses.  Pulmonary:     Effort: Pulmonary effort is normal. No respiratory distress.     Breath sounds: Normal breath sounds.  Musculoskeletal:     Cervical back: Neck supple.     Comments: RIGHT HAND: There is moderate swelling over the third MCP joint with some mild overlying erythema.  Mild swelling and erythema of the fifth MCP joint as  well.  He does have somewhat limited range of motion at the third MCP and third PIP joints due to swelling and discomfort.  Good pulses.  Skin:    General: Skin is warm and dry.  Neurological:     General: No focal deficit present.     Mental Status: He is alert and oriented to person, place, and time. Mental status is at baseline.     Cranial Nerves: No cranial nerve deficit.     Motor: No weakness.     Gait: Gait normal.  Psychiatric:        Mood and Affect: Mood normal.        Thought Content: Thought content normal.     UC Treatments / Results  Labs (all labs ordered are listed, but only abnormal results are displayed) Labs Reviewed - No data to display  EKG   Radiology DG Hand Complete Right  Result Date: 10/12/2020 CLINICAL DATA:  Fall, hand pain EXAM: RIGHT HAND - COMPLETE 3+ VIEW COMPARISON:  None. FINDINGS: There is no evidence of fracture or dislocation. There is no evidence of arthropathy or other focal bone abnormality. Soft tissues are unremarkable. IMPRESSION: Negative. Electronically Signed   By: Rolm Baptise M.D.   On: 10/12/2020 13:47    Procedures Procedures (including critical care time)  Medications Ordered in UC Medications - No data to display  Initial Impression / Assessment and Plan / UC Course  I have reviewed the triage vital signs and the nursing notes.  Pertinent labs & imaging results that were available during my care of the patient were reviewed by me and considered in my medical decision making (  see chart for details).  70 year old male presenting for pain and swelling of the third and fifth MCP joints of the right hand following an accidental fall yesterday.  Patient states that his hand hit the ground.  He denies any other injury sustained in the fall.  X-ray of hand was obtained today which shows no evidence of fracture or arthropathy or other acute abnormality.  Reviewed this with patient.  Supportive care advised with following RICE  guidelines, Tylenol for pain, and applying topical Voltaren gel to help with the inflammation.  He does have history of A. fib and takes Eliquis so he avoids any NSAIDs.  Again, patient did deny to me any head injury or loss of consciousness.  Cranial nerves grossly intact.  ED precautions reviewed related to injuries.  Advised him to go [facility] if not improving over the next several days or for any worsening symptoms.   Final Clinical Impressions(s) / UC Diagnoses   Final diagnoses:  Right hand pain  Swelling of right hand  Fall, initial encounter     Discharge Instructions      You do not have any fractures.  Please purchase over-the-counter Voltaren gel and apply to the knuckles to help with swelling.  Also try to ice this area every couple of hours and keep it elevated.  You can take Tylenol for pain relief for your at home tramadol if needed.  Once the swelling starts to go down if you continue to have pain you may need to see Ortho.  If the swelling does not go down and you are continue to have pain you still need to see Ortho.  You could have an underlying tendon or ligament issue which is difficult to evaluate right now due to your swelling and guarding since it is painful.  You may have a condition requiring you to follow up with Orthopedics so please call one of the following office for appointment:   [facility] [address] [place], [place] [address] Phone: [phone]  [facility] [address], [place], [place] [address] Phone: [phone]      ED Prescriptions   None    PDMP not reviewed this encounter.   Danton Clap, PA-C 10/12/20 1422

## 2020-10-12 NOTE — ED Triage Notes (Signed)
Pt c/o right hand pain. He states he fell last night and thinks he may have jammed his middle and ring finger on the ground. Her has swelling and decreased ROM.

## 2020-11-07 ENCOUNTER — Encounter: Payer: Self-pay | Admitting: Emergency Medicine

## 2020-11-07 ENCOUNTER — Ambulatory Visit
Admission: EM | Admit: 2020-11-07 | Discharge: 2020-11-07 | Disposition: A | Discharge status: Home / Self Care | Payer: Medicare Other | Attending: Emergency Medicine | Admitting: Emergency Medicine

## 2020-11-07 ENCOUNTER — Other Ambulatory Visit: Payer: Self-pay

## 2020-11-07 DIAGNOSIS — S41112A Laceration without foreign body of left upper arm, initial encounter: Secondary | ICD-10-CM | POA: Diagnosis not present

## 2020-11-07 MED ORDER — MUPIROCIN CALCIUM 2 % EX CREA: 1.0000 "application " | TOPICAL_CREAM | Freq: Two times a day (BID) | CUTANEOUS | 0 refills | Status: AC

## 2020-11-07 NOTE — Discharge Instructions (Signed)
-  Bactroban cream twice daily for three days with bandage change. Can continue afterwards if still have any signs of redness or infection -gentle cleansing with mild soap and water after 24 hours. Showers ok but no bathing or swimming until sutures come out. -sutures can be removed in 7-10 days

## 2020-11-07 NOTE — ED Triage Notes (Signed)
Pt is present today with a laceration on his lower forearm. Pt states that he was using a Landscape [employer] and it attached to his shirt and slide down and cut his arm.

## 2020-11-07 NOTE — ED Provider Notes (Signed)
[facility]    CSN: [id number] Arrival date & time: 11/07/20  1126      History   Chief Complaint Chief Complaint  Patient presents with   Laceration    Left forearm     HPI Jeffrey French is a 70 y.o. male.   Patient is a 70 year old male with past medical history as noted below, including A. fib on Eliquis, who presents with laceration to his forearm.  Patient states this morning he was using a metal grinder when it got caught up in his shirt and then caused a laceration to his forearm.  Patient denies any distal numbness or tingling.  Patient reports his last tetanus shot was 3-4 years ago.   Past Medical History:  Diagnosis Date   A-fib [facility])    resolved with ablation   Actinic keratosis 04/16/2020   L upper abdomen - bx proven    BPH (benign prostatic hyperplasia)    GERD (gastroesophageal reflux disease)    Hypertension    Numbness of foot    Bilateral. since knee replacements   Sleep apnea    moderate.  unable to tolerate CPAP    Patient Active Problem List   Diagnosis Date Noted   OSA (obstructive sleep apnea) 05/03/2018   Acute on chronic systolic CHF (congestive heart failure) ([place]) 02/28/2018   On amiodarone therapy 02/27/2018   History of depression 02/26/2018   Status post ablation of atrial fibrillation 02/17/2018   Atrial fibrillation ([place]) 01/05/2018   Heart palpitations 10/21/2017   Diffuse cerebral atrophy ([place]) 03/20/2017   SOB (shortness of breath) on exertion 03/19/2017   New onset atrial flutter ([place]) 09/18/2015   Irregular heart rhythm 09/17/2015   Absolute anemia 02/18/2015   Benign fibroma of prostate 02/18/2015   Clinical depression 02/18/2015   Abnormal prostate specific antigen 02/18/2015   Essential (primary) hypertension 02/18/2015   Anxiety, generalized 02/18/2015   Insomnia 02/18/2015   Arthritis, degenerative 02/18/2015   Peptic ulcer 02/18/2015   Elevated prostate specific antigen (PSA) 10/19/2013    Past  Surgical History:  Procedure Laterality Date   ATRIAL FIBRILLATION ABLATION  02/26/2018   [facility]   CATARACT EXTRACTION W/PHACO Left 11/08/2018   Procedure: CATARACT EXTRACTION PHACO AND INTRAOCULAR LENS PLACEMENT ([place]) LEFT;  Surgeon: Birder Robson, MD;  Location: [place];  Service: Ophthalmology;  Laterality: Left;  sleep apnea not a [place] per [place]   CATARACT EXTRACTION W/PHACO Right 11/29/2018   Procedure: CATARACT EXTRACTION PHACO AND INTRAOCULAR LENS PLACEMENT ([facility]) RIGHT  00:50.9  17.4%  8.88;  Surgeon: Birder Robson, MD;  Location: [place];  Service: Ophthalmology;  Laterality: Right;   ELECTROPHYSIOLOGIC STUDY N/A 10/09/2015   Procedure: Cardioversion;  Surgeon: Isaias Cowman, MD;  Location: [facility];  Service: Cardiovascular;  Laterality: N/A;   NASAL RECONSTRUCTION WITH SEPTAL REPAIR     REPLACEMENT TOTAL KNEE Right    SHOULDER SURGERY Left    TONSILLECTOMY         Home Medications    Prior to Admission medications   Medication Sig Start Date End Date Taking? Authorizing Provider  mupirocin cream (BACTROBAN) 2 % Apply 1 application topically 2 (two) times daily for 5 days. Apply to lacerations twice daily with dressing change 11/07/20 11/12/20 Yes Luvenia Redden, PA-C  alprazolam Duanne Moron) 2 MG tablet Take 0.5 tablets (1 mg total) by mouth as needed. 09/30/17   Jerrol Banana., MD  amLODipine (NORVASC) 5 MG tablet Take by mouth. 10/02/13  [provider]  apixaban (ELIQUIS) 5 MG TABS tablet Take 5 mg by mouth 2 (two) times daily.    [provider]  buPROPion (WELLBUTRIN XL) 300 MG 24 hr tablet TAKE 1 TABLET BY MOUTH DAILY 09/05/20   Jerrol Banana., MD  cephALEXin (KEFLEX) 500 MG capsule Take 1 capsule (500 mg total) by mouth 4 (four) times daily. Patient not taking: No sig reported 07/05/20   Verda Cumins, MD  Cholecalciferol (VITAMIN D) 2000 UNITS CAPS Take 1 capsule by mouth daily.  02/10/11   [provider]  finasteride (PROSCAR) 5 MG tablet TAKE 1 TABLET BY MOUTH DAILY 01/01/20   Jerrol Banana., MD  folic acid (FOLVITE) [id number] MCG tablet Take 1 tablet (800 mcg total) by mouth daily. 12/27/19   Jerrol Banana., MD  furosemide (LASIX) 20 MG tablet TAKE 1 TABLET (20 MG TOTAL) BY MOUTH ONCE DAILY. TAKE AN ADDITIONAL TABLET ASNEEDED FOR WEIGHT GAIN GREATER THAN 2 POUNDS A NIGHT. 04/04/18   [provider]  lisinopril (PRINIVIL,ZESTRIL) 5 MG tablet Take by mouth. 03/28/18   [provider]  Melatonin 1 MG TABS Take 2 mg by mouth at bedtime.     [provider]  metoprolol succinate (TOPROL-XL) 50 MG 24 hr tablet TAKE 1 TABLET BY MOUTH DAILY. 10/27/17   Jerrol Banana., MD  omeprazole (PRILOSEC) 20 MG capsule TAKE 1 CAPSULE BY MOUTH EVERY DAY. 07/27/17   Jerrol Banana., MD  tamsulosin [facility]) 0.4 MG CAPS capsule TAKE 1 CAPSULE BY MOUTH DAILY 01/01/20   Jerrol Banana., MD  temazepam (RESTORIL) 30 MG capsule TAKE 1 CAPSULE BY MOUTH EVERY DAY FOR SLEEP 08/07/20   Jerrol Banana., MD  venlafaxine XR (EFFEXOR-XR) 75 MG 24 hr capsule TAKE 3 CAPSULES BY MOUTH EVERY MORNING 01/01/20   Jerrol Banana., MD  spironolactone (ALDACTONE) 25 MG tablet Take by mouth. 03/17/18 05/31/19  [provider]    Family History Family History  Problem Relation Age of Onset   Arthritis Mother    Hypertension Mother    Colon cancer Mother    Other Mother        lymph node   Diabetes Father    Congestive Heart Failure Father    Arthritis Sister     Social History Social History   Tobacco Use   Smoking status: Former    Types: Cigarettes    Quit date: 1974    Years since quitting: 48.6   Smokeless tobacco: Never   Tobacco comments:    1974  Vaping Use   Vaping Use: Never used  Substance Use Topics   Alcohol use: Yes    Alcohol/week: 0.0 - 2.0 standard drinks   Drug use: No     Allergies    Sulfamethoxazole-trimethoprim   Review of Systems Review of Systems as noted above in HPI.  Other systems reviewed and found to be negative.   Physical Exam Triage Vital Signs ED Triage Vitals  Enc Vitals Group     BP 11/07/20 1236 119/69     Pulse Rate 11/07/20 1236 65     Resp 11/07/20 1236 17     Temp 11/07/20 1236 98.4 F (36.9 C)     Temp Source 11/07/20 1236 Oral     SpO2 11/07/20 1236 99 %     Weight --      Height --      Head Circumference --  Peak Flow --      Pain Score 11/07/20 1238 2     Pain Loc --      Pain Edu? --      Excl. in [place]? --    No data found.  Updated Vital Signs BP 119/69   Pulse 65   Temp 98.4 F (36.9 C) (Oral)   Resp 17   SpO2 99%    Physical Exam Constitutional:      Appearance: Normal appearance.  HENT:     Head: Normocephalic and atraumatic.  Cardiovascular:     Rate and Rhythm: Normal rate and regular rhythm.  Pulmonary:     Effort: No respiratory distress.     Breath sounds: Normal breath sounds.  Abdominal:       Comments: Approximately 2 inch abrasion to the left flank.  Skin:    General: Skin is warm.     Capillary Refill: Capillary refill takes less than 2 seconds.     Findings: Laceration present.          Comments: 3 lacerations to left arm.  Minimal depth.  Image below  Neurological:     Mental Status: He is alert.      UC Treatments / Results  Labs (all labs ordered are listed, but only abnormal results are displayed) Labs Reviewed - No data to display  EKG   Radiology No results found.  Procedures Laceration Repair  Date/Time: 11/07/2020 2:22 PM Performed by: Luvenia Redden, PA-C Authorized by: Melynda Ripple, MD   Consent:    Consent obtained:  Verbal   Consent given by:  Patient Universal protocol:    Procedure explained and questions answered to patient or proxy's satisfaction: yes     Patient identity confirmed:  Verbally with patient Anesthesia:    Anesthesia method:   Local infiltration   Local anesthetic:  Lidocaine 1% WITH epi Laceration details:    Location:  Shoulder/arm   Shoulder/arm location:  L lower arm   Wound length (cm): approx 10 cm total. see image. Pre-procedure details:    Preparation:  Patient was prepped and draped in usual sterile fashion Treatment:    Area cleansed with:  Soap and water and povidone-iodine   Amount of cleaning:  Standard   Irrigation method:  Tap   Visualized foreign bodies/material removed: no     Debridement:  None   Undermining:  None   Scar revision: no   Skin repair:    Repair method:  Sutures   Suture size:  4-0   Suture material:  Prolene   Suture technique:  Simple interrupted   Number of sutures:  10 Approximation:    Approximation:  Close Repair type:    Repair type:  Simple Post-procedure details:    Dressing:  Antibiotic ointment and non-adherent dressing   Procedure completion:  Tolerated (including critical care time)  Medications Ordered in UC Medications - No data to display  Initial Impression / Assessment and Plan / UC Course  I have reviewed the triage vital signs and the nursing notes.  Pertinent labs & imaging results that were available during my care of the patient were reviewed by me and considered in my medical decision making (see chart for details).    Laceration to the left forearm while using a metal grinder this morning.  2 small lacerations less than 2 cm each and then 1 longer laceration about 6 cm long.  Minimal depth at all.  Sutures done due to skin gap.  Laceration was closed with interrupted sutures with good approximation.  Bactroban ointment prescribed.  Instructions as below.  Further information provided on the information sheets.  Final Clinical Impressions(s) / UC Diagnoses   Final diagnoses:  Laceration of left upper extremity, initial encounter     Discharge Instructions      -Bactroban cream twice daily for three days with bandage change. Can  continue afterwards if still have any signs of redness or infection -gentle cleansing with mild soap and water after 24 hours. Showers ok but no bathing or swimming until sutures come out. -sutures can be removed in 7-10 days      ED Prescriptions     Medication Sig Dispense Auth. Provider   mupirocin cream (BACTROBAN) 2 % Apply 1 application topically 2 (two) times daily for 5 days. Apply to lacerations twice daily with dressing change 15 g Luvenia Redden, PA-C      PDMP not reviewed this encounter.   Luvenia Redden, PA-C 11/07/20 1430

## 2020-11-12 DIAGNOSIS — I4819 Other persistent atrial fibrillation: Secondary | ICD-10-CM | POA: Diagnosis not present

## 2020-11-12 DIAGNOSIS — E7800 Pure hypercholesterolemia, unspecified: Secondary | ICD-10-CM | POA: Diagnosis not present

## 2020-11-12 DIAGNOSIS — Z8679 Personal history of other diseases of the circulatory system: Secondary | ICD-10-CM | POA: Diagnosis not present

## 2020-11-12 DIAGNOSIS — Z9889 Other specified postprocedural states: Secondary | ICD-10-CM | POA: Diagnosis not present

## 2020-11-12 DIAGNOSIS — I420 Dilated cardiomyopathy: Secondary | ICD-10-CM | POA: Diagnosis not present

## 2020-11-12 DIAGNOSIS — I10 Essential (primary) hypertension: Secondary | ICD-10-CM | POA: Diagnosis not present

## 2021-01-02 ENCOUNTER — Ambulatory Visit (INDEPENDENT_AMBULATORY_CARE_PROVIDER_SITE_OTHER): Payer: Medicare Other | Admitting: Family Medicine

## 2021-01-02 ENCOUNTER — Other Ambulatory Visit: Payer: Self-pay

## 2021-01-02 ENCOUNTER — Encounter: Payer: Self-pay | Admitting: Family Medicine

## 2021-01-02 VITALS — BP 131/71 | HR 71 | Temp 97.9°F | Resp 16 | Ht 75.0 in | Wt 230.0 lb

## 2021-01-02 DIAGNOSIS — F32A Depression, unspecified: Secondary | ICD-10-CM

## 2021-01-02 DIAGNOSIS — N400 Enlarged prostate without lower urinary tract symptoms: Secondary | ICD-10-CM

## 2021-01-02 DIAGNOSIS — F3341 Major depressive disorder, recurrent, in partial remission: Secondary | ICD-10-CM | POA: Diagnosis not present

## 2021-01-02 DIAGNOSIS — J418 Mixed simple and mucopurulent chronic bronchitis: Secondary | ICD-10-CM

## 2021-01-02 DIAGNOSIS — I10 Essential (primary) hypertension: Secondary | ICD-10-CM | POA: Diagnosis not present

## 2021-01-02 DIAGNOSIS — G3184 Mild cognitive impairment, so stated: Secondary | ICD-10-CM | POA: Diagnosis not present

## 2021-01-02 DIAGNOSIS — I482 Chronic atrial fibrillation, unspecified: Secondary | ICD-10-CM

## 2021-01-02 DIAGNOSIS — Z0000 Encounter for general adult medical examination without abnormal findings: Secondary | ICD-10-CM

## 2021-01-02 DIAGNOSIS — F411 Generalized anxiety disorder: Secondary | ICD-10-CM

## 2021-01-02 DIAGNOSIS — K279 Peptic ulcer, site unspecified, unspecified as acute or chronic, without hemorrhage or perforation: Secondary | ICD-10-CM

## 2021-01-02 DIAGNOSIS — G4733 Obstructive sleep apnea (adult) (pediatric): Secondary | ICD-10-CM

## 2021-01-02 MED ORDER — TAMSULOSIN HCL 0.4 MG PO CAPS: 0.4000 mg | ORAL_CAPSULE | Freq: Every day | ORAL | 11 refills | Status: DC

## 2021-01-02 MED ORDER — FINASTERIDE 5 MG PO TABS: 5.0000 mg | ORAL_TABLET | Freq: Every day | ORAL | 1 refills | Status: DC

## 2021-01-02 MED ORDER — AMLODIPINE BESYLATE 5 MG PO TABS: 5.0000 mg | ORAL_TABLET | Freq: Every day | ORAL | 1 refills | Status: AC

## 2021-01-02 MED ORDER — VENLAFAXINE HCL ER 75 MG PO CP24: 225.0000 mg | ORAL_CAPSULE | Freq: Every morning | ORAL | 1 refills | Status: DC

## 2021-01-02 MED ORDER — METOPROLOL SUCCINATE ER 50 MG PO TB24: 50.0000 mg | ORAL_TABLET | Freq: Every day | ORAL | 1 refills | Status: DC

## 2021-01-02 MED ORDER — LISINOPRIL 5 MG PO TABS: 5.0000 mg | ORAL_TABLET | Freq: Every day | ORAL | 1 refills | Status: DC

## 2021-01-02 MED ORDER — BUPROPION HCL ER (XL) 300 MG PO TB24: 300.0000 mg | ORAL_TABLET | Freq: Every day | ORAL | 1 refills | Status: DC

## 2021-01-02 MED ORDER — APIXABAN 5 MG PO TABS: 5.0000 mg | ORAL_TABLET | Freq: Two times a day (BID) | ORAL | 1 refills | Status: AC

## 2021-01-02 MED ORDER — TRAZODONE HCL 50 MG PO TABS: 50.0000 mg | ORAL_TABLET | Freq: Every evening | ORAL | 1 refills | Status: DC | PRN

## 2021-01-02 MED ORDER — OMEPRAZOLE 20 MG PO CPDR: 20.0000 mg | DELAYED_RELEASE_CAPSULE | Freq: Every day | ORAL | 1 refills | Status: DC

## 2021-01-02 NOTE — Progress Notes (Signed)
I,Jeffrey French,acting as a scribe for Jeffrey Durie, MD.,have documented all relevant documentation on the behalf of Jeffrey Durie, MD,as directed by  Jeffrey Durie, MD while in the presence of Jeffrey Durie, MD.  Annual Wellness Visit     Patient: Jeffrey French, Male    DOB: 12/16/1950, 70 y.o.   MRN: [id number] Visit Date: 01/02/2021  Today's Provider: [name], MD   Chief Complaint  Patient presents with   Medicare Wellness   Subjective    Jeffrey French is a 70 y.o. male who presents today for his Annual Wellness Visit. He reports consuming a general diet. The patient does not participate in regular exercise at present. He generally feels fairly well. He reports sleeping poorly. He does not have additional problems to discuss today.  Patient not sleeping even on Restoril. He states he has had chest congestion for the past 6 months without any dyspnea PND orthopnea.  No cough.Marland Kitchen HPI    Medications: Outpatient Medications Prior to Visit  Medication Sig   alprazolam (XANAX) 2 MG tablet Take 0.5 tablets (1 mg total) by mouth as needed.   amLODipine (NORVASC) 5 MG tablet Take by mouth.   apixaban (ELIQUIS) 5 MG TABS tablet Take 5 mg by mouth 2 (two) times daily.   buPROPion (WELLBUTRIN XL) 300 MG 24 hr tablet TAKE 1 TABLET BY MOUTH DAILY   Cholecalciferol (VITAMIN D) 2000 UNITS CAPS Take 1 capsule by mouth daily.    finasteride (PROSCAR) 5 MG tablet TAKE 1 TABLET BY MOUTH DAILY   folic acid (FOLVITE) 950 MCG tablet Take 1 tablet (800 mcg total) by mouth daily.   furosemide (LASIX) 20 MG tablet TAKE 1 TABLET (20 MG TOTAL) BY MOUTH ONCE DAILY. TAKE AN ADDITIONAL TABLET ASNEEDED FOR WEIGHT GAIN GREATER THAN 2 POUNDS A NIGHT.   lisinopril (PRINIVIL,ZESTRIL) 5 MG tablet Take by mouth.   Melatonin 1 MG TABS Take 2 mg by mouth at bedtime.    metoprolol succinate (TOPROL-XL) 50 MG 24 hr tablet TAKE 1 TABLET BY MOUTH DAILY.   omeprazole (PRILOSEC) 20 MG  capsule TAKE 1 CAPSULE BY MOUTH EVERY DAY.   tamsulosin (FLOMAX) 0.4 MG CAPS capsule TAKE 1 CAPSULE BY MOUTH DAILY   temazepam (RESTORIL) 30 MG capsule TAKE 1 CAPSULE BY MOUTH EVERY DAY FOR SLEEP   venlafaxine XR (EFFEXOR-XR) 75 MG 24 hr capsule TAKE 3 CAPSULES BY MOUTH EVERY MORNING   [DISCONTINUED] cephALEXin (KEFLEX) 500 MG capsule Take 1 capsule (500 mg total) by mouth 4 (four) times daily. (Patient not taking: No sig reported)   No facility-administered medications prior to visit.    Allergies  Allergen Reactions   Sulfamethoxazole-Trimethoprim Rash    (in mouth)    Patient Care Team: Jerrol Banana., MD as PCP - General (Family Medicine) Vladimir Crofts, MD as Consulting Physician (Neurology) Isaias Cowman, MD as Consulting Physician (Cardiology) Murrell Redden, MD as Consulting Physician (Urology) Thornton Park, MD as Referring Physician (Orthopedic Surgery) Ralene Bathe, MD as Consulting Physician (Dermatology) Beverly Gust, MD as Consulting Physician (Otolaryngology)  Review of Systems  HENT:  Positive for congestion and sinus pressure.   Musculoskeletal:  Positive for arthralgias and back pain.  Hematological:  Bruises/bleeds easily.  Psychiatric/Behavioral:  Positive for dysphoric mood.   All other systems reviewed and are negative.      Objective    Vitals: BP 131/71 (BP Location: Right Arm, Patient Position: Sitting, Cuff Size: Large)  Pulse 71   Temp 97.9 F (36.6 C) (Oral)   Resp 16   Ht 6\' 3"  (1.905 m)   Wt 230 lb (104.3 kg)   SpO2 97%   BMI 28.75 kg/m    Physical Exam Vitals reviewed.  Constitutional:      Appearance: He is well-developed.  HENT:     Head: Normocephalic and atraumatic.     Right Ear: External ear normal.     Left Ear: External ear normal.  Eyes:     General: No scleral icterus.    Conjunctiva/sclera: Conjunctivae normal.  Neck:     Thyroid: No thyromegaly.  Cardiovascular:     Rate and Rhythm:  Normal rate and regular rhythm.     Heart sounds: Normal heart sounds.  Pulmonary:     Effort: Pulmonary effort is normal.     Breath sounds: Normal breath sounds.  Abdominal:     Palpations: Abdomen is soft.  Skin:    General: Skin is warm and dry.     Comments: Fair skin.  Neurological:     Mental Status: He is alert and oriented to person, place, and time. Mental status is at baseline.     Comments: Mild ataxic gait. I think his thinking is clearer than it was on the previous visits  Psychiatric:        Mood and Affect: Mood normal.        Behavior: Behavior normal.        Thought Content: Thought content normal.        Judgment: Judgment normal.     Most recent functional status assessment: In your present state of health, do you have any difficulty performing the following activities: 01/02/2021  Hearing? Y  Vision? N  Difficulty concentrating or making decisions? N  Walking or climbing stairs? N  Dressing or bathing? N  Doing errands, shopping? N  Some recent data might be hidden   Most recent fall risk assessment: Fall Risk  01/02/2021  Falls in the past year? 1  Number falls in past yr: 1  Injury with Fall? 1  Risk for fall due to : History of fall(s)  Follow up Falls evaluation completed    Most recent depression screenings: PHQ 2/9 Scores 01/02/2021 07/31/2020  PHQ - 2 Score 2 0  PHQ- 9 Score 5 1   Most recent cognitive screening: 6CIT Screen 04/28/2016  What Year? 0 points  What month? 0 points  What time? 0 points  Count back from 20 0 points  Months in reverse 0 points  Repeat phrase 2 points  Total Score 2   Most recent Audit-C alcohol use screening Alcohol Use Disorder Test (AUDIT) 01/02/2021  1. How often do you have a drink containing alcohol? 2  2. How many drinks containing alcohol do you have on a typical day when you are drinking? 0  3. How often do you have six or more drinks on one occasion? 0  AUDIT-C Score 2  4. How often during the last  year have you found that you were not able to stop drinking once you had started? -  5. How often during the last year have you failed to do what was normally expected from you because of drinking? -  6. How often during the last year have you needed a first drink in the morning to get yourself going after a heavy drinking session? -  7. How often during the last year have you had a feeling  of guilt of remorse after drinking? -  8. How often during the last year have you been unable to remember what happened the night before because you had been drinking? -  9. Have you or someone else been injured as a result of your drinking? -  10. Has a relative or friend or a doctor or another health worker been concerned about your drinking or suggested you cut down? -  Alcohol Use Disorder Identification Test Final Score (AUDIT) -   A score of 3 or more in women, and 4 or more in men indicates increased risk for alcohol abuse, EXCEPT if all of the points are from question 1   No results found for any visits on 01/02/21.  Assessment & Plan     Annual wellness visit done today including the all of the following: Reviewed patient's Family Medical History Reviewed and updated list of patient's medical providers Assessment of cognitive impairment was done Assessed patient's functional ability Established a written schedule for health screening [place] Completed and Reviewed  Exercise Activities and Dietary recommendations  Goals      DIET - EAT MORE FRUITS AND VEGETABLES     Recommend increasing amount of fruits and vegetables in daily diet to at least 1 serving of each daily.         Immunization History  Administered Date(s) Administered   Hepatitis A 11/29/2009   Influenza-Unspecified 01/17/2018   Pneumococcal Conjugate-13 01/13/2018   Pneumococcal Polysaccharide-23 01/18/2019   Td 01/13/2018   Tdap 06/21/2015   Zoster, Live 01/19/2013    Health Maintenance   Topic Date Due   COVID-19 Vaccine (1) Never done   Zoster Vaccines- Shingrix (1 of 2) Never done   INFLUENZA VACCINE  11/04/2020   COLONOSCOPY (Pts 45-27yrs Insurance coverage will need to be confirmed)  11/22/2023   TETANUS/TDAP  01/14/2028   Hepatitis C Screening  Completed   HPV VACCINES  Aged Out     Discussed health benefits of physical activity, and encouraged him to engage in regular exercise appropriate for his age and condition.    1. Encounter for annual wellness visit (AWV) in Medicare patient   2. Recurrent major depressive disorder, in partial remission ([place]) On venlafaxine to 25 daily  3. Benign fibroma of prostate On Flomax at finasteride - tamsulosin (FLOMAX) 0.4 MG CAPS capsule; Take 1 capsule (0.4 mg total) by mouth daily.  Dispense: 30 capsule; Refill: 11 - finasteride (PROSCAR) 5 MG tablet; Take 1 tablet (5 mg total) by mouth daily.  Dispense: 90 tablet; Refill: 1  4. Depression, unspecified depression type  - buPROPion (WELLBUTRIN XL) 300 MG 24 hr tablet; Take 1 tablet (300 mg total) by mouth daily.  Dispense: 90 tablet; Refill: 1 - venlafaxine XR (EFFEXOR-XR) 75 MG 24 hr capsule; Take 3 capsules (225 mg total) by mouth every morning.  Dispense: 270 capsule; Refill: 1  5. Peptic ulcer  - omeprazole (PRILOSEC) 20 MG capsule; Take 1 capsule (20 mg total) by mouth daily.  Dispense: 90 capsule; Refill: 1  6. OSA (obstructive sleep apnea) On CPAP  7. Essential (primary) hypertension On lisinopril metoprolol and amlodipine  8. Anxiety, generalized On the Effexor  9. MCI (mild cognitive impairment) MMSE on next visit  10. Mixed simple and mucopurulent chronic bronchitis (HCC) At [facility] 1 puff twice a day and follow-up in 1 to 2 months  11. Chronic atrial fibrillation (HCC) On Eliquis 5 mg twice a day    No follow-ups on  file.     I, Jeffrey Durie, MD, have reviewed all documentation for this visit. The documentation on 01/03/21 for the  exam, diagnosis, procedures, and orders are all accurate and complete.    Arianna Delsanto Cranford Mon, MD  [facility] [phone] (phone) [phone] (fax)  [place]

## 2021-01-02 NOTE — Patient Instructions (Signed)
Try OTC Robitussin Twice a day. Stop Restoril.

## 2021-01-06 DIAGNOSIS — J0140 Acute pansinusitis, unspecified: Secondary | ICD-10-CM | POA: Diagnosis not present

## 2021-01-29 DIAGNOSIS — J329 Chronic sinusitis, unspecified: Secondary | ICD-10-CM | POA: Diagnosis not present

## 2021-01-29 DIAGNOSIS — R0981 Nasal congestion: Secondary | ICD-10-CM | POA: Diagnosis not present

## 2021-02-06 ENCOUNTER — Telehealth: Payer: Self-pay

## 2021-02-06 DIAGNOSIS — N400 Enlarged prostate without lower urinary tract symptoms: Secondary | ICD-10-CM

## 2021-02-06 MED ORDER — TAMSULOSIN HCL 0.4 MG PO CAPS: 0.4000 mg | ORAL_CAPSULE | Freq: Every day | ORAL | 1 refills | Status: DC

## 2021-02-06 NOTE — Telephone Encounter (Signed)
Copied from [place] [phone] [id number]. Topic: General - Other >> Feb 05, 2021  4:52 PM L[place] wrote: tamsulosin (FLOMAX) 0.4 MG CAPS capsule 30 capsule 11 01/02/2021   Sig - Route: Take 1 capsule (0.4 mg total) by mouth daily. - Oral  Sent to pharmacy as: tamsulosin (FLOMAX) 0.4 MG Cap capsule  E-Prescribing Status: Receipt confirmed by pharmacy (01/02/2021 10:28 AM EDT)   [place] [place] [address] Phone: [phone] Fax: [phone]  Apothecary called and states that insurance is now only going to approve a 90 day supply at the time. Request a new script be written  sent and placed on file.

## 2021-02-10 ENCOUNTER — Other Ambulatory Visit: Payer: Self-pay

## 2021-02-10 ENCOUNTER — Ambulatory Visit (INDEPENDENT_AMBULATORY_CARE_PROVIDER_SITE_OTHER): Payer: Medicare Other | Admitting: Family Medicine

## 2021-02-10 ENCOUNTER — Encounter: Payer: Self-pay | Admitting: Family Medicine

## 2021-02-10 VITALS — BP 125/58 | HR 80 | Temp 97.8°F | Resp 16 | Ht 75.0 in | Wt 223.0 lb

## 2021-02-10 DIAGNOSIS — G4733 Obstructive sleep apnea (adult) (pediatric): Secondary | ICD-10-CM

## 2021-02-10 DIAGNOSIS — I482 Chronic atrial fibrillation, unspecified: Secondary | ICD-10-CM

## 2021-02-10 DIAGNOSIS — F3341 Major depressive disorder, recurrent, in partial remission: Secondary | ICD-10-CM

## 2021-02-10 DIAGNOSIS — G319 Degenerative disease of nervous system, unspecified: Secondary | ICD-10-CM | POA: Diagnosis not present

## 2021-02-10 DIAGNOSIS — R911 Solitary pulmonary nodule: Secondary | ICD-10-CM

## 2021-02-10 DIAGNOSIS — G4709 Other insomnia: Secondary | ICD-10-CM | POA: Diagnosis not present

## 2021-02-10 NOTE — Progress Notes (Signed)
I,Jeffrey French,acting as a scribe for Jeffrey Durie, MD.,have documented all relevant documentation on the behalf of Jeffrey Durie, MD,as directed by  Jeffrey Durie, MD while in the presence of Jeffrey Durie, MD.   Established patient visit   Patient: Jeffrey French   DOB: 1950/11/22   70 y.o. Male  MRN: [id number] Visit Date: 02/10/2021  Today's healthcare provider: [name], MD   Chief Complaint  Patient presents with   Follow-up   Insomnia   Subjective    HPI  She comes in today for follow-up.  He is feeling pretty well. He had a sinus infection postnasal drainage and cough that was treated by Dr. Tami Ribas with Cipro.  Briefly reviewing his history with a cough it turns out he needs follow-up chest CT for pulm with certainly benign pulmonary nodules from last year. Follow up for Insomnia:  The patient was last seen for this 1 months ago. Changes made at last visit include; given trazadone.  He reports good compliance with treatment. He feels that condition is Unchanged. He is not having side effects. none  -----------------------------------------------------------------------------------------  Patient states he is doing well on trazodone.     Medications: Outpatient Medications Prior to Visit  Medication Sig   alprazolam (XANAX) 2 MG tablet Take 0.5 tablets (1 mg total) by mouth as needed.   amLODipine (NORVASC) 5 MG tablet Take 1 tablet (5 mg total) by mouth daily.   apixaban (ELIQUIS) 5 MG TABS tablet Take 1 tablet (5 mg total) by mouth 2 (two) times daily.   buPROPion (WELLBUTRIN XL) 300 MG 24 hr tablet Take 1 tablet (300 mg total) by mouth daily.   Cholecalciferol (VITAMIN D) 2000 UNITS CAPS Take 1 capsule by mouth daily.    ciprofloxacin (CIPRO) 500 MG tablet Take 500 mg by mouth 2 (two) times daily.   finasteride (PROSCAR) 5 MG tablet Take 1 tablet (5 mg total) by mouth daily.   folic acid (FOLVITE) 425 MCG tablet Take 1 tablet  (800 mcg total) by mouth daily.   furosemide (LASIX) 20 MG tablet TAKE 1 TABLET (20 MG TOTAL) BY MOUTH ONCE DAILY. TAKE AN ADDITIONAL TABLET ASNEEDED FOR WEIGHT GAIN GREATER THAN 2 POUNDS A NIGHT.   lisinopril (ZESTRIL) 5 MG tablet Take 1 tablet (5 mg total) by mouth daily.   Melatonin 1 MG TABS Take 2 mg by mouth at bedtime.    metoprolol succinate (TOPROL-XL) 50 MG 24 hr tablet Take 1 tablet (50 mg total) by mouth daily. Take with or immediately following a meal.   omeprazole (PRILOSEC) 20 MG capsule Take 1 capsule (20 mg total) by mouth daily.   tamsulosin (FLOMAX) 0.4 MG CAPS capsule Take 1 capsule (0.4 mg total) by mouth daily.   temazepam (RESTORIL) 30 MG capsule TAKE 1 CAPSULE BY MOUTH EVERY DAY FOR SLEEP   traZODone (DESYREL) 50 MG tablet Take 1 tablet (50 mg total) by mouth at bedtime as needed for sleep.   venlafaxine XR (EFFEXOR-XR) 75 MG 24 hr capsule Take 3 capsules (225 mg total) by mouth every morning.   No facility-administered medications prior to visit.    Review of Systems  Constitutional:  Negative for appetite change, chills and fever.  Respiratory:  Negative for chest tightness, shortness of breath and wheezing.   Cardiovascular:  Negative for chest pain and palpitations.  Gastrointestinal:  Negative for abdominal pain, nausea and vomiting.       Objective    BP Marland Kitchen)  125/58 (BP Location: Left Arm, Patient Position: Sitting, Cuff Size: Large)   Pulse 80   Temp 97.8 F (36.6 C) (Temporal)   Resp 16   Ht 6\' 3"  (1.905 m)   Wt 223 lb (101.2 kg)   SpO2 99%   BMI 27.87 kg/m  BP Readings from Last 3 Encounters:  02/10/21 (!) 125/58  01/02/21 131/71  11/07/20 119/69   Wt Readings from Last 3 Encounters:  02/10/21 223 lb (101.2 kg)  01/02/21 230 lb (104.3 kg)  10/12/20 229 lb 0.9 oz (103.9 kg)      Physical Exam Vitals reviewed.  Constitutional:      General: He is not in acute distress.    Appearance: He is well-developed.  HENT:     Head: Normocephalic  and atraumatic.     Right Ear: Hearing normal.     Left Ear: Hearing normal.     Nose: Nose normal.  Eyes:     General: Lids are normal. No scleral icterus.       Right eye: No discharge.        Left eye: No discharge.     Conjunctiva/sclera: Conjunctivae normal.  Cardiovascular:     Rate and Rhythm: Normal rate and regular rhythm.     Heart sounds: Normal heart sounds.  Pulmonary:     Effort: Pulmonary effort is normal. No respiratory distress.  Skin:    General: Skin is warm and dry.     Findings: No lesion or rash.  Neurological:     General: No focal deficit present.     Mental Status: He is alert and oriented to person, place, and time.  Psychiatric:        Mood and Affect: Mood normal.        Speech: Speech normal.        Behavior: Behavior normal.        Thought Content: Thought content normal.        Judgment: Judgment normal.      No results found for any visits on 02/10/21.  Assessment & Plan     1. Pulmonary nodule Follow-up chest CT.  If this is stable he needs no further follow-up - CT Chest Wo Contrast; Future  2. Solitary pulmonary nodule  - CT Chest Wo Contrast; Future  3. Chronic atrial fibrillation (HCC) On Eliquis  4. OSA (obstructive sleep apnea) CPAP  5. Diffuse cerebral atrophy (HCC) Some cognitive impairment.  MMSE on next visit  6. Other insomnia Hesitant and seems to have helped  7. Recurrent major depressive disorder, in partial remission ([place]) Continue venlafaxine indefinitely   Return in about 6 months (around 08/10/2021).      I, Jeffrey Durie, MD, have reviewed all documentation for this visit. The documentation on 02/14/21 for the exam, diagnosis, procedures, and orders are all accurate and complete.    Denisha Hoel Cranford Mon, MD  [facility] [phone] (phone) [phone] (fax)  [place]

## 2021-02-19 ENCOUNTER — Other Ambulatory Visit: Payer: Self-pay

## 2021-02-19 ENCOUNTER — Ambulatory Visit (INDEPENDENT_AMBULATORY_CARE_PROVIDER_SITE_OTHER): Payer: Medicare Other | Admitting: Dermatology

## 2021-02-19 DIAGNOSIS — L578 Other skin changes due to chronic exposure to nonionizing radiation: Secondary | ICD-10-CM

## 2021-02-19 DIAGNOSIS — D229 Melanocytic nevi, unspecified: Secondary | ICD-10-CM | POA: Diagnosis not present

## 2021-02-19 DIAGNOSIS — L821 Other seborrheic keratosis: Secondary | ICD-10-CM

## 2021-02-19 DIAGNOSIS — L853 Xerosis cutis: Secondary | ICD-10-CM

## 2021-02-19 DIAGNOSIS — D1800 Hemangioma unspecified site: Secondary | ICD-10-CM

## 2021-02-19 DIAGNOSIS — L814 Other melanin hyperpigmentation: Secondary | ICD-10-CM | POA: Diagnosis not present

## 2021-02-19 DIAGNOSIS — Z872 Personal history of diseases of the skin and subcutaneous tissue: Secondary | ICD-10-CM

## 2021-02-19 DIAGNOSIS — L918 Other hypertrophic disorders of the skin: Secondary | ICD-10-CM | POA: Diagnosis not present

## 2021-02-19 DIAGNOSIS — Z1283 Encounter for screening for malignant neoplasm of skin: Secondary | ICD-10-CM

## 2021-02-19 NOTE — Patient Instructions (Signed)

## 2021-02-19 NOTE — Progress Notes (Signed)
   Follow-Up Visit   Subjective  Jeffrey French is a 70 y.o. male who presents for the following: Annual Exam (History of Aks - TBSE today). The patient has several areas to be evaluated today.  He has history of's precancers treated in the past. The patient presents for Total-Body Skin Exam (TBSE) for skin cancer screening and mole check.  The following portions of the chart were reviewed this encounter and updated as appropriate:   Tobacco  Allergies  Meds  Problems  Med Hx  Surg Hx  Fam Hx     Review of Systems:  No other skin or systemic complaints except as noted in HPI or Assessment and Plan.  Objective  Well appearing patient in no apparent distress; mood and affect are within normal limits.  A full examination was performed including scalp, head, eyes, ears, nose, lips, neck, chest, axillae, abdomen, back, buttocks, bilateral upper extremities, bilateral lower extremities, hands, feet, fingers, toes, fingernails, and toenails. All findings within normal limits unless otherwise noted below.  Legs Xerosis   Left upper abdomen Well healed biopsy site   Assessment & Plan   Acrochordons (Skin Tags) - Fleshy, skin-colored pedunculated papules - Benign appearing.  - Observe. - If desired, they can be removed with an in office procedure that is not covered by insurance. - Please call the clinic if you notice any new or changing lesions.  Xerosis cutis Legs  Recommend moisturizer daily  History of actinic keratoses Left upper abdomen  Biopsy proven hypertrophic AK - Clear. Observe for recurrence. Call clinic for new or changing lesions.  Recommend regular skin exams, daily broad-spectrum spf 30+ sunscreen use, and photoprotection.     Lentigines - Scattered tan macules - Due to sun exposure - Benign-appearing, observe - Recommend daily broad spectrum sunscreen SPF 30+ to sun-exposed areas, reapply every 2 hours as needed. - Call for any changes  Seborrheic  Keratoses - Stuck-on, waxy, tan-brown papules and/or plaques  - Benign-appearing - Discussed benign etiology and prognosis. - Observe - Call for any changes  Melanocytic Nevi - Tan-brown and/or pink-flesh-colored symmetric macules and papules - Benign appearing on exam today - Observation - Call clinic for new or changing moles - Recommend daily use of broad spectrum spf 30+ sunscreen to sun-exposed areas.   Hemangiomas - Red papules - Discussed benign nature - Observe - Call for any changes  Actinic Damage - Chronic condition, secondary to cumulative UV/sun exposure - diffuse scaly erythematous macules with underlying dyspigmentation - Recommend daily broad spectrum sunscreen SPF 30+ to sun-exposed areas, reapply every 2 hours as needed.  - Staying in the shade or wearing long sleeves, sun glasses (UVA+UVB protection) and wide brim hats (4-inch brim around the entire circumference of the hat) are also recommended for sun protection.  - Call for new or changing lesions.  Skin cancer screening performed today.  Return in about 1 year (around 02/19/2022) for TBSE.  I, Ashok Cordia, CMA, am acting as scribe for Sarina Ser, MD . Documentation: I have reviewed the above documentation for accuracy and completeness, and I agree with the above.  Sarina Ser, MD

## 2021-03-03 ENCOUNTER — Other Ambulatory Visit: Payer: Self-pay | Admitting: Family Medicine

## 2021-03-03 DIAGNOSIS — R0981 Nasal congestion: Secondary | ICD-10-CM | POA: Diagnosis not present

## 2021-03-03 DIAGNOSIS — J329 Chronic sinusitis, unspecified: Secondary | ICD-10-CM | POA: Diagnosis not present

## 2021-03-04 ENCOUNTER — Encounter: Payer: Self-pay | Admitting: Dermatology

## 2021-03-07 ENCOUNTER — Ambulatory Visit
Admission: RE | Admit: 2021-03-07 | Discharge: 2021-03-07 | Disposition: A | Discharge status: Home / Self Care | Payer: Medicare Other | Source: Ambulatory Visit | Attending: Family Medicine | Admitting: Family Medicine

## 2021-03-07 ENCOUNTER — Other Ambulatory Visit: Payer: Self-pay

## 2021-03-07 DIAGNOSIS — I700 Atherosclerosis of aorta: Secondary | ICD-10-CM | POA: Diagnosis not present

## 2021-03-07 DIAGNOSIS — J479 Bronchiectasis, uncomplicated: Secondary | ICD-10-CM | POA: Diagnosis not present

## 2021-03-07 DIAGNOSIS — R911 Solitary pulmonary nodule: Secondary | ICD-10-CM | POA: Insufficient documentation

## 2021-03-12 NOTE — Progress Notes (Signed)
Patient has gallstones; and stable lung nodule. Recommend use of statin for identified atherosclerotic calcification.  Please let us know if you are agreeable.  Please let us know if you have any questions.  Thank you,  Tally Joe, FNP

## 2021-03-13 NOTE — Progress Notes (Signed)
Pt would like to start statin medication.  KP

## 2021-03-14 ENCOUNTER — Other Ambulatory Visit: Payer: Self-pay | Admitting: Family Medicine

## 2021-03-14 MED ORDER — ATORVASTATIN CALCIUM 40 MG PO TABS: 40.0000 mg | ORAL_TABLET | Freq: Every day | ORAL | 3 refills | Status: DC

## 2021-03-17 ENCOUNTER — Telehealth: Payer: Self-pay

## 2021-03-17 DIAGNOSIS — I700 Atherosclerosis of aorta: Secondary | ICD-10-CM | POA: Insufficient documentation

## 2021-03-17 NOTE — Telephone Encounter (Signed)
He has some cholesterol plaques in the aorta and coronary arteries. The treatment for this is statins, such as atorvastatin. The cardiologist can look at the report, but doesn't typically require any follow up tests as long as he is tolerating statin.

## 2021-03-17 NOTE — Telephone Encounter (Signed)
Copied from [place] [phone]. Topic: General - Other >> Mar 17, 2021  9:11 AM Tessa Lerner A wrote: Reason for CRM: The patient has been notified of the results of their Chest CT scan  The patient was directed to come into the office and discuss their results further   Before the patient comes in, they would like to know if they'll be discussing their results with Dr. Caryn Section or another member of clinical staff  Please contact further to confirm

## 2021-03-17 NOTE — Telephone Encounter (Addendum)
I called patient to clarify message below. Patient would like Dr. Caryn Section to review his CT results and last office visit from 02/10/2021 with Dr. Rosanna Randy. Patient wants Dr. Maralyn Sago recommendations. He has an appointment with Cardiology tomorrow and wants to know if he needs to have Cardiology follow up on anything such as the identified atherosclerotic calcification that Tally Joe, PA-C mentioned. Please advise.

## 2021-03-18 DIAGNOSIS — E7800 Pure hypercholesterolemia, unspecified: Secondary | ICD-10-CM | POA: Diagnosis not present

## 2021-03-18 DIAGNOSIS — I5022 Chronic systolic (congestive) heart failure: Secondary | ICD-10-CM | POA: Diagnosis not present

## 2021-03-18 DIAGNOSIS — Z23 Encounter for immunization: Secondary | ICD-10-CM | POA: Diagnosis not present

## 2021-03-18 DIAGNOSIS — I700 Atherosclerosis of aorta: Secondary | ICD-10-CM | POA: Diagnosis not present

## 2021-03-18 DIAGNOSIS — I4819 Other persistent atrial fibrillation: Secondary | ICD-10-CM | POA: Diagnosis not present

## 2021-03-18 DIAGNOSIS — I4891 Unspecified atrial fibrillation: Secondary | ICD-10-CM | POA: Diagnosis not present

## 2021-03-18 DIAGNOSIS — I420 Dilated cardiomyopathy: Secondary | ICD-10-CM | POA: Diagnosis not present

## 2021-03-18 DIAGNOSIS — I10 Essential (primary) hypertension: Secondary | ICD-10-CM | POA: Diagnosis not present

## 2021-03-19 NOTE — Telephone Encounter (Signed)
I called and spoke with patient. Patient says that he was seen by the Cardiologist yesterday and everything was explained to him. Patient does not have any further questions at this time.

## 2021-03-23 IMAGING — CR DG CHEST 2V
4 series · 4 of 4 positions shown · non-contrast
Comparison: 01/05/2018.
COMPARISON: 01/05/2018.

Addendum:
CLINICAL DATA: Cough with pleuritic chest pain. COVID 19 positive

EXAM:
CHEST - 2 VIEW

[chest pa (1 of 2)]
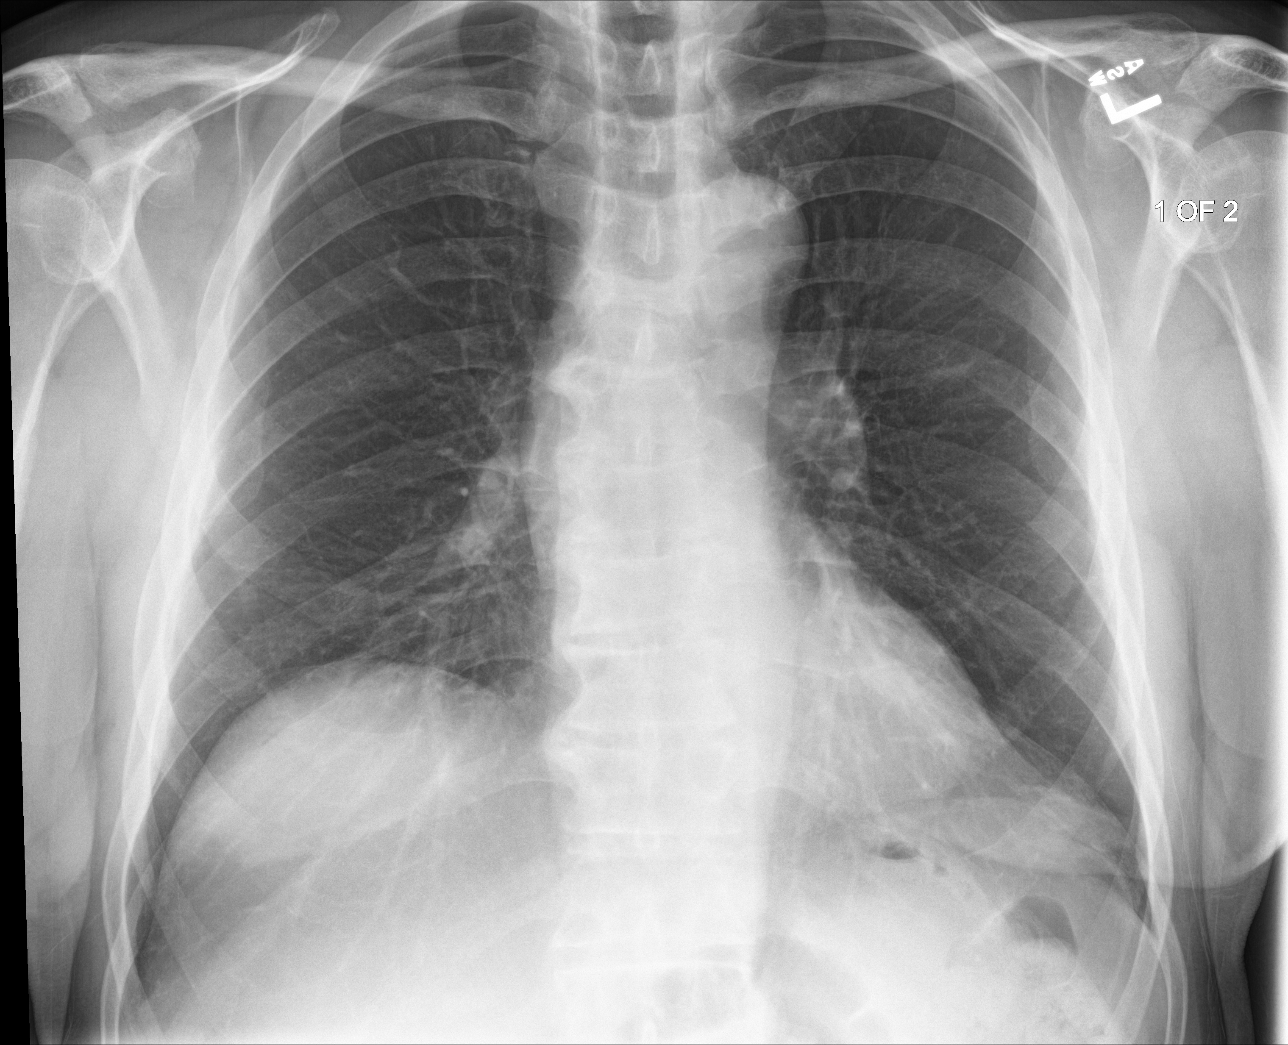

[chest lat (1 of 2)]
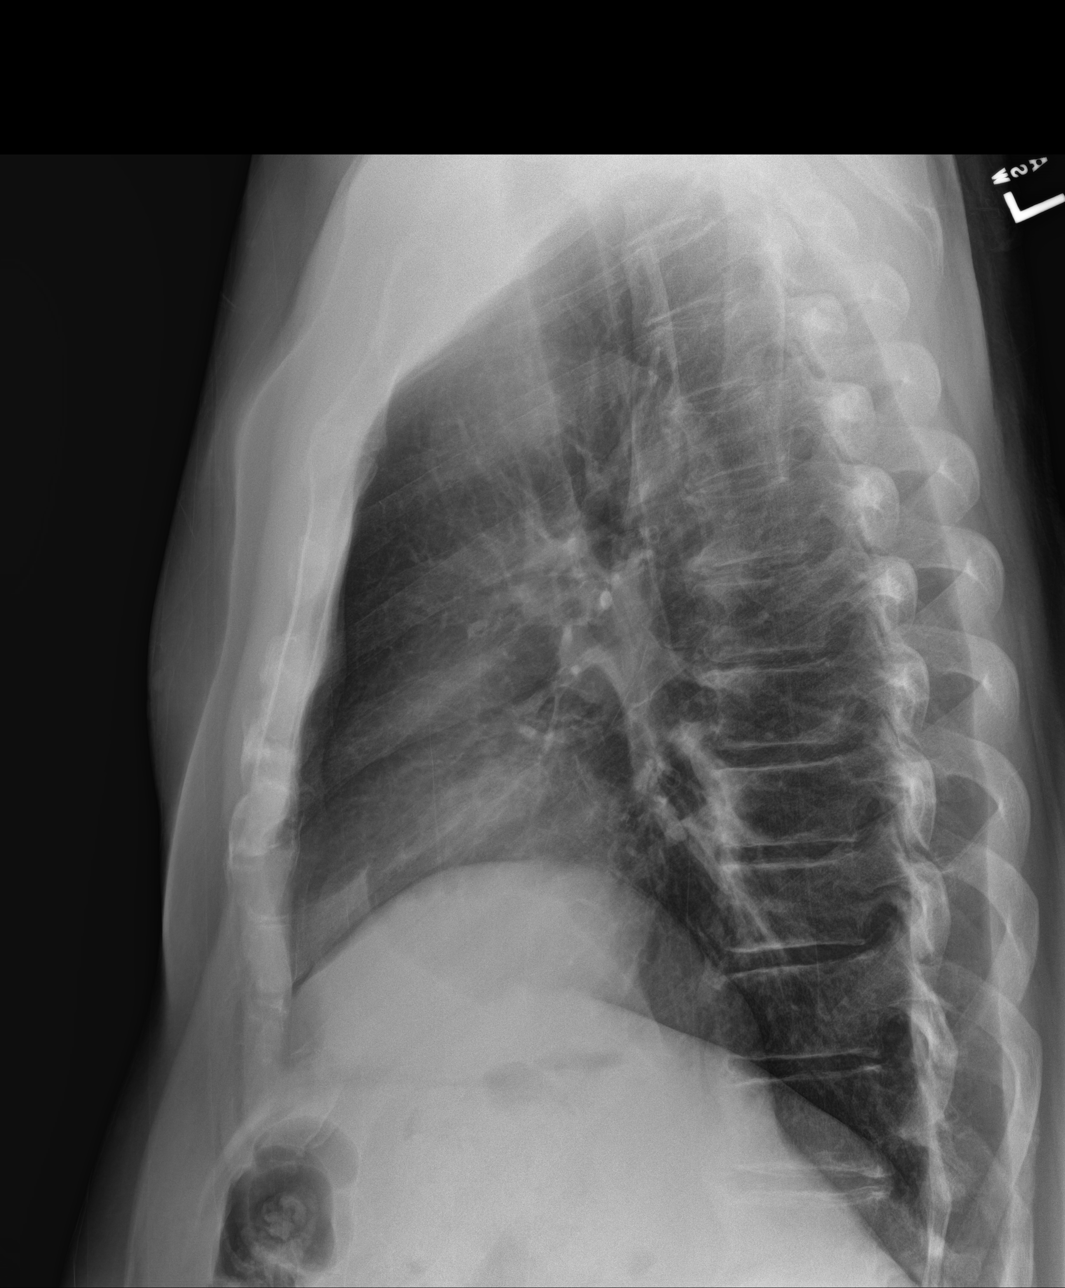

[chest pa (2 of 2)]
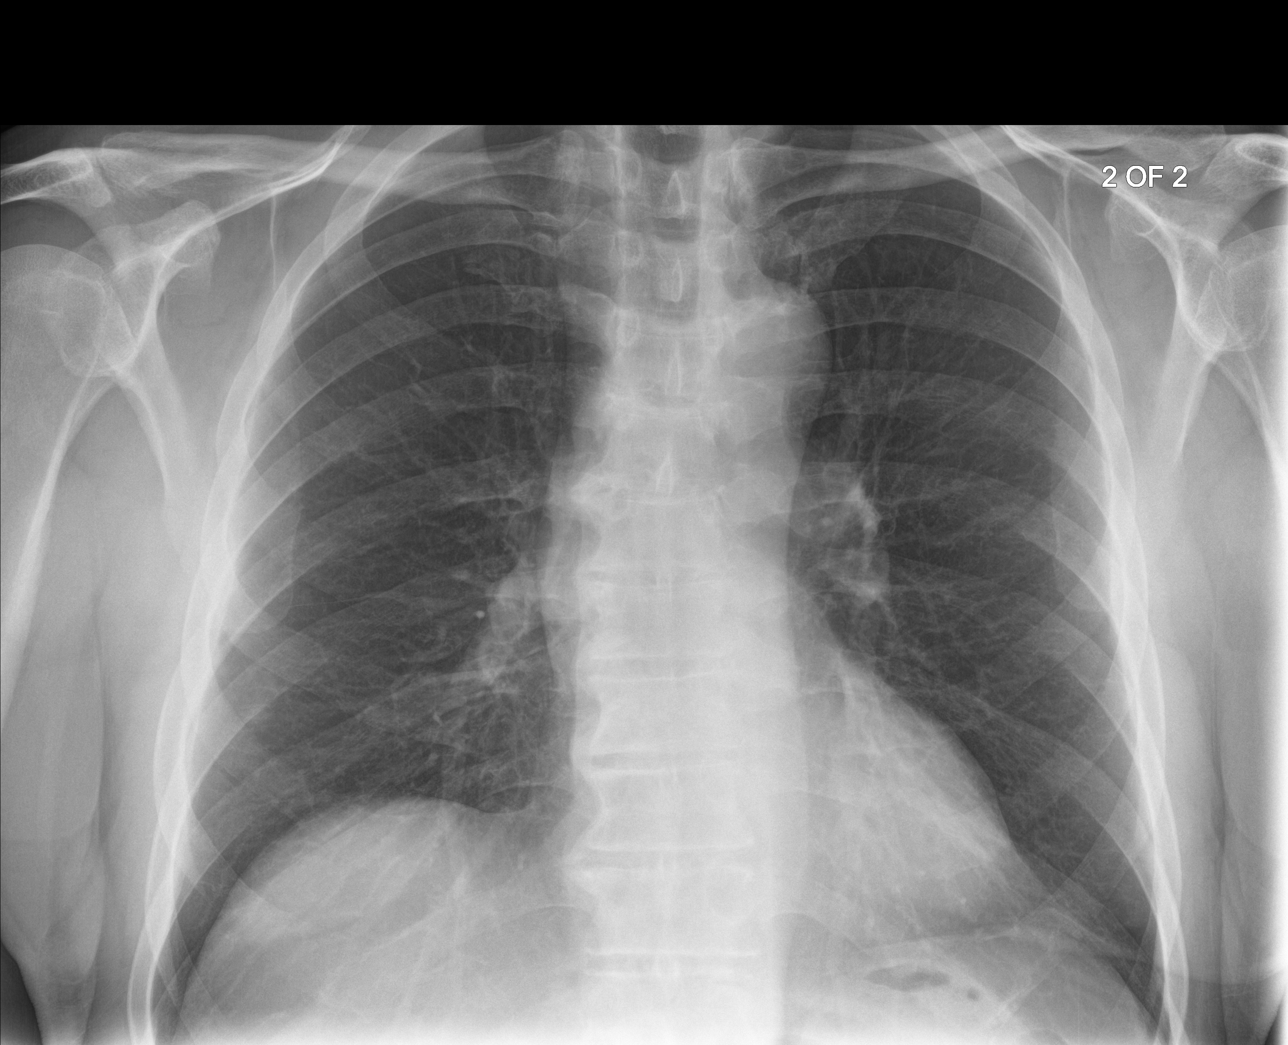

[chest lat (2 of 2)]
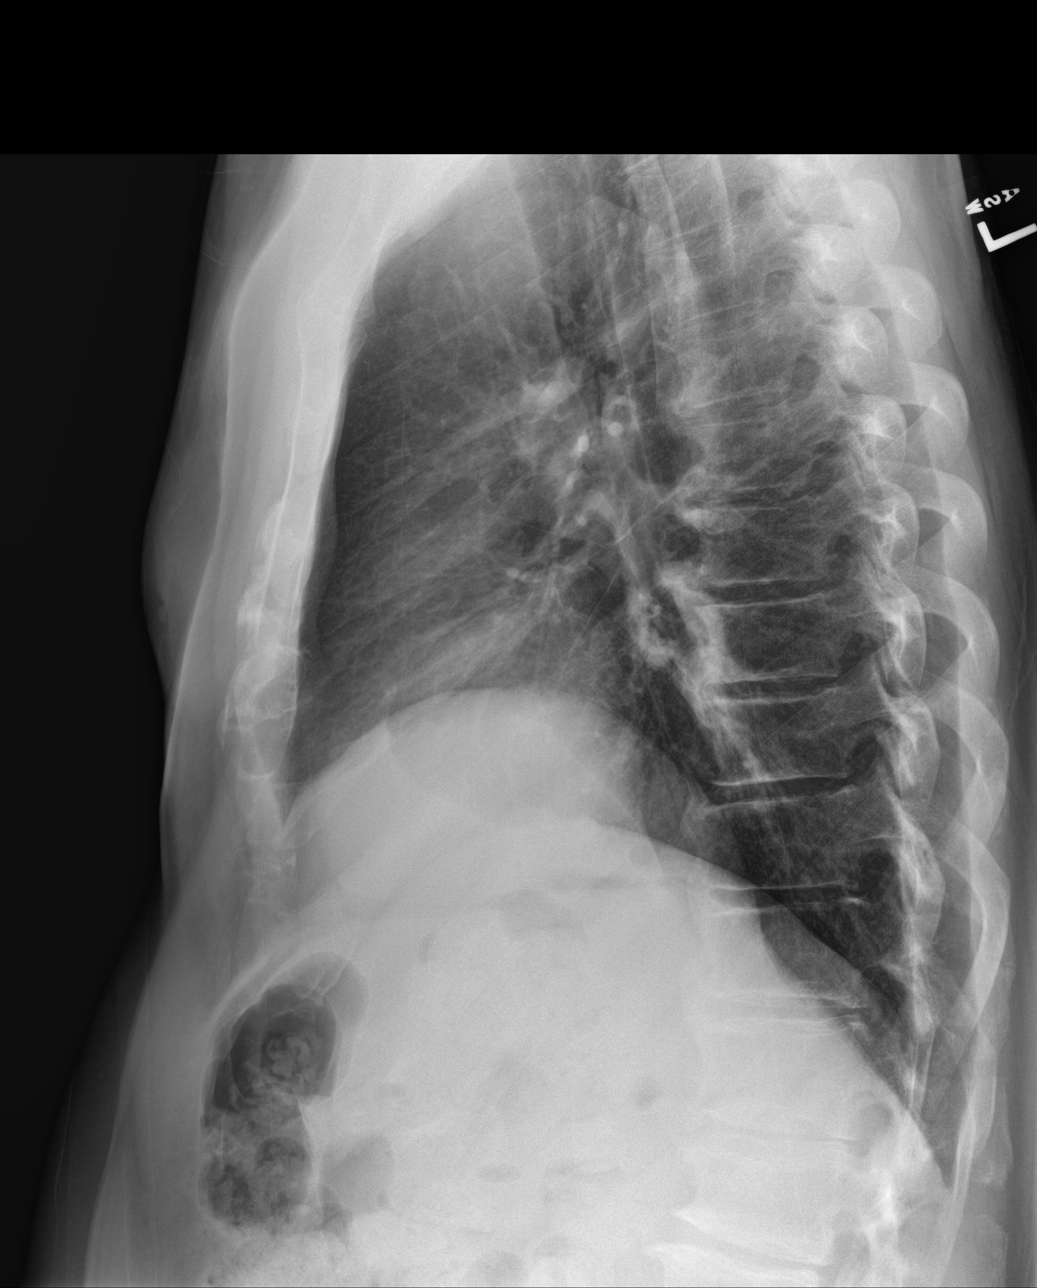

[4 of 4 positions shown; findings below may reference images not displayed]

FINDINGS: Lung apices have not been included on the PA film. Visualized lungs
show no focal consolidation small nodular opacity seen at the right
base, projecting between the anterior fifth and sixth ribs. The
cardiopericardial silhouette is within normal limits for size. The
visualized bony structures of the thorax are intact.
IMPRESSION: 1. Tiny nodular opacity at the right base. CT chest without contrast
recommended to further evaluate.
2. No focal consolidation or pulmonary edema.

ADDENDUM:
A second PA film has now been provided. Lung apices are included on
this additional film show no evidence for pneumothorax. Nodular
opacity at the right base again noted.

*** End of Addendum ***
FINDINGS: Lung apices have not been included on the PA film. Visualized lungs
show no focal consolidation small nodular opacity seen at the right
base, projecting between the anterior fifth and sixth ribs. The
cardiopericardial silhouette is within normal limits for size. The
visualized bony structures of the thorax are intact.
IMPRESSION: 1. Tiny nodular opacity at the right base. CT chest without contrast
recommended to further evaluate.
2. No focal consolidation or pulmonary edema.

## 2021-04-02 ENCOUNTER — Other Ambulatory Visit: Payer: Self-pay | Admitting: Family Medicine

## 2021-04-02 DIAGNOSIS — N400 Enlarged prostate without lower urinary tract symptoms: Secondary | ICD-10-CM

## 2021-04-03 DIAGNOSIS — S6992XA Unspecified injury of left wrist, hand and finger(s), initial encounter: Secondary | ICD-10-CM | POA: Diagnosis not present

## 2021-04-03 DIAGNOSIS — R609 Edema, unspecified: Secondary | ICD-10-CM | POA: Diagnosis not present

## 2021-04-03 DIAGNOSIS — S61111A Laceration without foreign body of right thumb with damage to nail, initial encounter: Secondary | ICD-10-CM | POA: Diagnosis not present

## 2021-04-03 DIAGNOSIS — K219 Gastro-esophageal reflux disease without esophagitis: Secondary | ICD-10-CM | POA: Diagnosis not present

## 2021-04-03 DIAGNOSIS — Z23 Encounter for immunization: Secondary | ICD-10-CM | POA: Diagnosis not present

## 2021-04-03 DIAGNOSIS — Z7901 Long term (current) use of anticoagulants: Secondary | ICD-10-CM | POA: Diagnosis not present

## 2021-04-03 DIAGNOSIS — I4891 Unspecified atrial fibrillation: Secondary | ICD-10-CM | POA: Diagnosis not present

## 2021-04-03 DIAGNOSIS — I10 Essential (primary) hypertension: Secondary | ICD-10-CM | POA: Diagnosis not present

## 2021-04-03 DIAGNOSIS — Z87891 Personal history of nicotine dependence: Secondary | ICD-10-CM | POA: Diagnosis not present

## 2021-04-03 DIAGNOSIS — S61012A Laceration without foreign body of left thumb without damage to nail, initial encounter: Secondary | ICD-10-CM | POA: Diagnosis not present

## 2021-04-03 DIAGNOSIS — W298XXA Contact with other powered powered hand tools and household machinery, initial encounter: Secondary | ICD-10-CM | POA: Diagnosis not present

## 2021-04-03 DIAGNOSIS — Y33XXXA Other specified events, undetermined intent, initial encounter: Secondary | ICD-10-CM | POA: Diagnosis not present

## 2021-04-08 DIAGNOSIS — W298XXA Contact with other powered powered hand tools and household machinery, initial encounter: Secondary | ICD-10-CM | POA: Diagnosis not present

## 2021-04-08 DIAGNOSIS — S61012A Laceration without foreign body of left thumb without damage to nail, initial encounter: Secondary | ICD-10-CM | POA: Diagnosis not present

## 2021-04-09 ENCOUNTER — Other Ambulatory Visit: Payer: Self-pay | Admitting: Family Medicine

## 2021-04-10 NOTE — Telephone Encounter (Signed)
Requested medications are due for refill today.  yes  Requested medications are on the active medications list.  yes  Last refill. 09/30/2017  Future visit scheduled.   yes  Notes to clinic.  Medication not delegated    Requested Prescriptions  Pending Prescriptions Disp Refills   alprazolam (XANAX) 2 MG tablet [Pharmacy Med Name: ALPRAZOLAM 2 MG TAB] 30 tablet     Sig: TAKE 1/2 TABLET BY MOUTH UP TO TWICE A DAY AS NEEDED. MAXIMUM 1 TABLET PER DAY.     Not Delegated - Psychiatry:  Anxiolytics/Hypnotics Failed - 04/09/2021  9:20 AM      Failed - This refill cannot be delegated      Failed - Urine Drug Screen completed in last 360 days      Passed - Valid encounter within last 6 months    Recent Outpatient Visits           1 month ago Pulmonary nodule   [facility] Jerrol Banana., MD   3 months ago Encounter for annual wellness visit (AWV) in Medicare patient   [facility] Jerrol Banana., MD   8 months ago Hyperglycemia   [facility] Jerrol Banana., MD   1 year ago Skin lesion   [facility] Virginia Crews, MD   1 year ago Benign prostatic hyperplasia, unspecified whether lower urinary tract symptoms present   [facility] Jerrol Banana., MD       Future Appointments             In 4 months Jerrol Banana., MD [facility] [place]   In 10 months Ralene Bathe, MD [place]

## 2021-04-10 NOTE — Telephone Encounter (Signed)
LOV: 02/10/2021  NOV: 08/11/2021   Thanks,   -Mickel Baas

## 2021-04-15 DIAGNOSIS — S61012D Laceration without foreign body of left thumb without damage to nail, subsequent encounter: Secondary | ICD-10-CM | POA: Diagnosis not present

## 2021-04-15 DIAGNOSIS — W298XXD Contact with other powered powered hand tools and household machinery, subsequent encounter: Secondary | ICD-10-CM | POA: Diagnosis not present

## 2021-04-15 DIAGNOSIS — Z8679 Personal history of other diseases of the circulatory system: Secondary | ICD-10-CM | POA: Diagnosis not present

## 2021-04-15 DIAGNOSIS — I420 Dilated cardiomyopathy: Secondary | ICD-10-CM | POA: Diagnosis not present

## 2021-04-15 DIAGNOSIS — I5022 Chronic systolic (congestive) heart failure: Secondary | ICD-10-CM | POA: Diagnosis not present

## 2021-04-15 DIAGNOSIS — Z7901 Long term (current) use of anticoagulants: Secondary | ICD-10-CM | POA: Diagnosis not present

## 2021-05-02 DIAGNOSIS — I420 Dilated cardiomyopathy: Secondary | ICD-10-CM | POA: Diagnosis not present

## 2021-05-02 DIAGNOSIS — Z7901 Long term (current) use of anticoagulants: Secondary | ICD-10-CM | POA: Diagnosis not present

## 2021-05-02 DIAGNOSIS — W311XXD Contact with metalworking machines, subsequent encounter: Secondary | ICD-10-CM | POA: Diagnosis not present

## 2021-05-02 DIAGNOSIS — I5022 Chronic systolic (congestive) heart failure: Secondary | ICD-10-CM | POA: Diagnosis not present

## 2021-05-02 DIAGNOSIS — S61012D Laceration without foreign body of left thumb without damage to nail, subsequent encounter: Secondary | ICD-10-CM | POA: Diagnosis not present

## 2021-05-02 DIAGNOSIS — Z8679 Personal history of other diseases of the circulatory system: Secondary | ICD-10-CM | POA: Diagnosis not present

## 2021-05-13 DIAGNOSIS — I420 Dilated cardiomyopathy: Secondary | ICD-10-CM | POA: Diagnosis not present

## 2021-05-13 DIAGNOSIS — Z4789 Encounter for other orthopedic aftercare: Secondary | ICD-10-CM | POA: Diagnosis not present

## 2021-05-13 DIAGNOSIS — Z8679 Personal history of other diseases of the circulatory system: Secondary | ICD-10-CM | POA: Diagnosis not present

## 2021-05-13 DIAGNOSIS — W298XXD Contact with other powered powered hand tools and household machinery, subsequent encounter: Secondary | ICD-10-CM | POA: Diagnosis not present

## 2021-05-13 DIAGNOSIS — Z7901 Long term (current) use of anticoagulants: Secondary | ICD-10-CM | POA: Diagnosis not present

## 2021-05-13 DIAGNOSIS — I5022 Chronic systolic (congestive) heart failure: Secondary | ICD-10-CM | POA: Diagnosis not present

## 2021-05-13 DIAGNOSIS — S61012D Laceration without foreign body of left thumb without damage to nail, subsequent encounter: Secondary | ICD-10-CM | POA: Diagnosis not present

## 2021-07-08 ENCOUNTER — Telehealth: Payer: Self-pay | Admitting: *Deleted

## 2021-07-08 NOTE — Chronic Care Management (AMB) (Signed)
?  Care Management  ? ?Note ? ?07/08/2021 ?Name: Jeffrey French MRN: [id number] DOB: 07/23/1950 ? ?[name] is a 71 y.o. year old male who is a primary care patient of Jerrol Banana., MD. I reached out to Linford Arnold by phone today offer care coordination services.  ? ?[name] was given information about care management services today including:  ?Care management services include personalized support from designated clinical staff supervised by his physician, including individualized plan of care and coordination with other care providers ?24/7 contact phone numbers for assistance for urgent and routine care needs. ?The patient may stop care management services at any time by phone call to the office staff. ? ?Patient did not agree to enrollment in care management services and does not wish to consider at this time. ? ?Follow up plan: ?Patient declines further follow up and engagement by the care management team. Appropriate care team members and provider have been notified via electronic communication.  ? ?Tristyn Demarest, CCMA ?Care Guide, Embedded Care Coordination ?[place]  Care Management  ?Direct Dial: [phone] ? ? ?

## 2021-07-08 NOTE — Chronic Care Management (AMB) (Signed)
?  Care Management  ? ?Outreach Note ? ?07/08/2021 ?Name: Jeffrey French MRN: [id number] DOB: 07-Aug-1950 ? ?Referred by: Jerrol Banana., MD ?Reason for referral : Care Coordination (Outreach to schedule initial with [facility] ) ? ? ?An unsuccessful telephone outreach was attempted today. The patient was referred to the case management team for assistance with care management and care coordination.  ? ?Follow Up Plan:  ?A HIPAA compliant phone message was left for the patient providing contact information and requesting a return call.  ? ?[place] Noori, CCMA ?Care Guide, Embedded Care Coordination ?[place]  Care Management  ?Direct Dial: [phone] ? ? ?

## 2021-07-18 ENCOUNTER — Other Ambulatory Visit: Payer: Self-pay | Admitting: Family Medicine

## 2021-07-18 DIAGNOSIS — F32A Depression, unspecified: Secondary | ICD-10-CM

## 2021-07-18 DIAGNOSIS — K279 Peptic ulcer, site unspecified, unspecified as acute or chronic, without hemorrhage or perforation: Secondary | ICD-10-CM

## 2021-07-18 DIAGNOSIS — N400 Enlarged prostate without lower urinary tract symptoms: Secondary | ICD-10-CM

## 2021-07-18 NOTE — Telephone Encounter (Signed)
Requested Prescriptions  ?Pending Prescriptions Disp Refills  ?? lisinopril (ZESTRIL) 5 MG tablet [Pharmacy Med Name: LISINOPRIL 5 MG TAB] 90 tablet 1  ?  Sig: TAKE 1 TABLET BY MOUTH DAILY  ?  ? Cardiovascular:  ACE Inhibitors Failed - 07/18/2021 10:41 AM  ?  ?  Failed - Cr in normal range and within 180 days  ?  Creatinine, Ser  ?Date Value Ref Range Status  ?08/02/2020 1.10 0.61 - 1.24 mg/dL Final  ?   ?  ?  Failed - K in normal range and within 180 days  ?  Potassium  ?Date Value Ref Range Status  ?08/02/2020 4.6 3.5 - 5.1 mmol/L Final  ?   ?  ?  Passed - Patient is not pregnant  ?  ?  Passed - Last BP in normal range  ?  BP Readings from Last 1 Encounters:  ?02/10/21 (!) 125/58  ?   ?  ?  Passed - Valid encounter within last 6 months  ?  Recent Outpatient Visits   ?      ? 5 months ago Pulmonary nodule  ? [facility] Jerrol Banana., MD  ? 6 months ago Encounter for annual wellness visit (AWV) in [place] patient  ? [facility] Jerrol Banana., MD  ? 11 months ago Hyperglycemia  ? [facility] Jerrol Banana., MD  ? 1 year ago Skin lesion  ? [facility] [place], Dionne Bucy, MD  ? 1 year ago Benign prostatic hyperplasia, unspecified whether lower urinary tract symptoms present  ? [facility] Jerrol Banana., MD  ?  ?  ?Future Appointments   ?        ? In 3 weeks Jerrol Banana., MD [facility]  ? In 7 months Ralene Bathe, MD [place]  ?  ? ?  ?  ?  ?? buPROPion (WELLBUTRIN XL) 300 MG 24 hr tablet [Pharmacy Med Name: BUPROPION HCL ER (XL) 300 MG TAB] 90 tablet 0  ?  Sig: TAKE 1 TABLET BY MOUTH DAILY  ?  ? Psychiatry: Antidepressants - bupropion Passed - 07/18/2021 10:41 AM  ?  ?  Passed - Cr in normal range and within 360 days  ?  Creatinine, Ser  ?Date Value Ref Range Status  ?08/02/2020 1.10 0.61 - 1.24 mg/dL Final  ?   ?  ?  Passed - AST in normal range and within 360  days  ?  AST  ?Date Value Ref Range Status  ?08/02/2020 22 15 - 41 U/L Final  ?   ?  ?  Passed - ALT in normal range and within 360 days  ?  ALT  ?Date Value Ref Range Status  ?08/02/2020 20 0 - 44 U/L Final  ?   ?  ?  Passed - Completed PHQ-2 or PHQ-9 in the last 360 days  ?  ?  Passed - Last BP in normal range  ?  BP Readings from Last 1 Encounters:  ?02/10/21 (!) 125/58  ?   ?  ?  Passed - Valid encounter within last 6 months  ?  Recent Outpatient Visits   ?      ? 5 months ago Pulmonary nodule  ? [facility] Jerrol Banana., MD  ? 6 months ago Encounter for annual wellness visit (AWV) in [place] patient  ? [place], [place],  MD  ? 11 months ago Hyperglycemia  ? [facility] Jerrol Banana., MD  ? 1 year ago Skin lesion  ? [facility] [place], Dionne Bucy, MD  ? 1 year ago Benign prostatic hyperplasia, unspecified whether lower urinary tract symptoms present  ? [facility] Jerrol Banana., MD  ?  ?  ?Future Appointments   ?        ? In 3 weeks Jerrol Banana., MD [facility]  ? In 7 months Ralene Bathe, MD [place]  ?  ? ?  ?  ?  ?? venlafaxine XR (EFFEXOR-XR) 75 MG 24 hr capsule [Pharmacy Med Name: VENLAFAXINE HCL ER 75 MG CAP] 270 capsule 1  ?  Sig: TAKE 3 CAPSULES BY MOUTH EVERY MORNING  ?  ? Psychiatry: Antidepressants - SNRI - desvenlafaxine & venlafaxine Failed - 07/18/2021 10:41 AM  ?  ?  Failed - Lipid Panel in normal range within the last 12 months  ?  Cholesterol  ?Date Value Ref Range Status  ?08/02/2020 221 (H) 0 - 200 mg/dL Final  ? ?LDL Cholesterol  ?Date Value Ref Range Status  ?08/02/2020 136 (H) 0 - 99 mg/dL Final  ?  Comment:  ?         ?Total Cholesterol/HDL:CHD Risk ?Coronary Heart Disease Risk Table ?                    Men   Women ? 1/2 Average Risk   3.4   3.3 ? Average Risk       5.0   4.4 ? 2 X Average Risk   9.6   7.1 ? 3 X Average  Risk  23.4   11.0 ?       ?Use the calculated Patient Ratio ?above and the CHD Risk Table ?to determine the patient's CHD Risk. ?       ?ATP III CLASSIFICATION (LDL): ? <100     mg/dL   Optimal ? 100-129  mg/dL   Near or Above ?                   Optimal ? 130-159  mg/dL   Borderline ? 160-189  mg/dL   High ? >190     mg/dL   Very High ?Performed at [facility], [address] [place], [place] [address] ?  ? ?HDL  ?Date Value Ref Range Status  ?08/02/2020 56 >40 mg/dL Final  ? ?Triglycerides  ?Date Value Ref Range Status  ?08/02/2020 144 <150 mg/dL Final  ? ?  ?  ?  Passed - Cr in normal range and within 360 days  ?  Creatinine, Ser  ?Date Value Ref Range Status  ?08/02/2020 1.10 0.61 - 1.24 mg/dL Final  ?   ?  ?  Passed - Completed PHQ-2 or PHQ-9 in the last 360 days  ?  ?  Passed - Last BP in normal range  ?  BP Readings from Last 1 Encounters:  ?02/10/21 (!) 125/58  ?   ?  ?  Passed - Valid encounter within last 6 months  ?  Recent Outpatient Visits   ?      ? 5 months ago Pulmonary nodule  ? [facility] Jerrol Banana., MD  ? 6 months ago Encounter for annual wellness visit (AWV) in [place] patient  ? [facility] Jerrol Banana., MD  ?  11 months ago Hyperglycemia  ? [facility] Jerrol Banana., MD  ? 1 year ago Skin lesion  ? [facility] [place], Dionne Bucy, MD  ? 1 year ago Benign prostatic hyperplasia, unspecified whether lower urinary tract symptoms present  ? [facility] Jerrol Banana., MD  ?  ?  ?Future Appointments   ?        ? In 3 weeks Jerrol Banana., MD [facility]  ? In 7 months Ralene Bathe, MD [place]  ?  ? ?  ?  ?  ?? traZODone (DESYREL) 50 MG tablet [Pharmacy Med Name: TRAZODONE HCL 50 MG TAB] 90 tablet 1  ?  Sig: TAKE 1 TABLET AT BEDTIME AS NEEDED FOR SLEEP  ?  ? Psychiatry: Antidepressants - Serotonin Modulator Passed - 07/18/2021  10:41 AM  ?  ?  Passed - Completed PHQ-2 or PHQ-9 in the last 360 days  ?  ?  Passed - Valid encounter within last 6 months  ?  Recent Outpatient Visits   ?      ? 5 months ago Pulmonary nodule  ? [facility] Jerrol Banana., MD  ? 6 months ago Encounter for annual wellness visit (AWV) in [place] patient  ? [facility] Jerrol Banana., MD  ? 11 months ago Hyperglycemia  ? [facility] Jerrol Banana., MD  ? 1 year ago Skin lesion  ? [facility] [place], Dionne Bucy, MD  ? 1 year ago Benign prostatic hyperplasia, unspecified whether lower urinary tract symptoms present  ? [facility] Jerrol Banana., MD  ?  ?  ?Future Appointments   ?        ? In 3 weeks Jerrol Banana., MD [facility]  ? In 7 months Ralene Bathe, MD [place]  ?  ? ?  ?  ?  ?? tamsulosin (FLOMAX) 0.4 MG CAPS capsule [Pharmacy Med Name: TAMSULOSIN HCL 0.4 MG CAP] 90 capsule 1  ?  Sig: TAKE 1 CAPSULE BY MOUTH DAILY  ?  ? Urology: Alpha-Adrenergic Blocker Failed - 07/18/2021 10:41 AM  ?  ?  Failed - PSA in normal range and within 360 days  ?  PSA  ?Date Value Ref Range Status  ?02/08/2014 8.0  Final  ?   ?  ?  Passed - Last BP in normal range  ?  BP Readings from Last 1 Encounters:  ?02/10/21 (!) 125/58  ?   ?  ?  Passed - Valid encounter within last 12 months  ?  Recent Outpatient Visits   ?      ? 5 months ago Pulmonary nodule  ? [facility] Jerrol Banana., MD  ? 6 months ago Encounter for annual wellness visit (AWV) in [place] patient  ? [facility] Jerrol Banana., MD  ? 11 months ago Hyperglycemia  ? [facility] Jerrol Banana., MD  ? 1 year ago Skin lesion  ? [facility] [place], Dionne Bucy, MD  ? 1 year ago Benign prostatic hyperplasia, unspecified whether lower urinary tract symptoms present  ? [facility] Jerrol Banana., MD  ?  ?  ?Future Appointments   ?        ? In 3 weeks Jerrol Banana., MD [facility]  ?  In 7 months Ralene Bathe, MD [place]  ?  ? ?

## 2021-07-18 NOTE — Telephone Encounter (Signed)
Requested medication (s) are due for refill today: yes ? ?Requested medication (s) are on the active medication list: yes ? ?Last refill:  lisinopril 01/02/21 #90/1, tamulosin 02/06/21 #90/1 ? ?Future visit scheduled: yes ? ?Notes to clinic:  Unable to refill per protocol due to failed labs, no updated results. ? ? ?  ?Requested Prescriptions  ?Pending Prescriptions Disp Refills  ? lisinopril (ZESTRIL) 5 MG tablet [Pharmacy Med Name: LISINOPRIL 5 MG TAB] 90 tablet 1  ?  Sig: TAKE 1 TABLET BY MOUTH DAILY  ?  ? Cardiovascular:  ACE Inhibitors Failed - 07/18/2021 10:41 AM  ?  ?  Failed - Cr in normal range and within 180 days  ?  Creatinine, Ser  ?Date Value Ref Range Status  ?08/02/2020 1.10 0.61 - 1.24 mg/dL Final  ?  ?  ?  ?  Failed - K in normal range and within 180 days  ?  Potassium  ?Date Value Ref Range Status  ?08/02/2020 4.6 3.5 - 5.1 mmol/L Final  ?  ?  ?  ?  Passed - Patient is not pregnant  ?  ?  Passed - Last BP in normal range  ?  BP Readings from Last 1 Encounters:  ?02/10/21 (!) 125/58  ?  ?  ?  ?  Passed - Valid encounter within last 6 months  ?  Recent Outpatient Visits   ? ?      ? 5 months ago Pulmonary nodule  ? [facility] Jer[facility]., MD  ? 6 months ago Encounter for annual wellness visit (AWV) in [place] patient  ? [facility] Jerrol Banana., MD  ? 11 months ago Hyperglycemia  ? [facility] Jerrol Banana., MD  ? 1 year ago Skin lesion  ? [facility] [place], Dionne Bucy, MD  ? 1 year ago Benign prostatic hyperplasia, unspecified whether lower urinary tract symptoms present  ? [facility] Jerrol Banana., MD  ? ?  ?  ?Future Appointments   ? ?        ? In 3 weeks Jerrol Banana., MD [facility]  ? In 7 months Ralene Bathe, MD [place]  ? ?  ? ? ?  ?  ?  ? tamsulosin (FLOMAX) 0.4 MG CAPS capsule [Pharmacy Med Name: TAMSULOSIN HCL 0.4 MG CAP] 90  capsule 1  ?  Sig: TAKE 1 CAPSULE BY MOUTH DAILY  ?  ? Urology: Alpha-Adrenergic Blocker Failed - 07/18/2021 10:41 AM  ?  ?  Failed - PSA in normal range and within 360 days  ?  PSA  ?Date Value Ref Range Status  ?02/08/2014 8.0  Final  ?  ?  ?  ?  Passed - Last BP in normal range  ?  BP Readings from Last 1 Encounters:  ?02/10/21 (!) 125/58  ?  ?  ?  ?  Passed - Valid encounter within last 12 months  ?  Recent Outpatient Visits   ? ?      ? 5 months ago Pulmonary nodule  ? [facility] Jerrol Banana., MD  ? 6 months ago Encounter for annual wellness visit (AWV) in [place] patient  ? [facility] Jerrol Banana., MD  ? 11 months ago Hyperglycemia  ? [facility] Jerrol [facility]., MD  ? 1 year ago Skin lesion  ? [place],  Dionne Bucy, MD  ? 1 year ago Benign prostatic hyperplasia, unspecified whether lower urinary tract symptoms present  ? [facility] Jerrol Banana., MD  ? ?  ?  ?Future Appointments   ? ?        ? In 3 weeks Jerrol Banana., MD [facility]  ? In 7 months Ralene Bathe, MD [place]  ? ?  ? ? ?  ?  ?  ?Signed Prescriptions Disp Refills  ? buPROPion (WELLBUTRIN XL) 300 MG 24 hr tablet 90 tablet 0  ?  Sig: TAKE 1 TABLET BY MOUTH DAILY  ?  ? Psychiatry: Antidepressants - bupropion Passed - 07/18/2021 10:41 AM  ?  ?  Passed - Cr in normal range and within 360 days  ?  Creatinine, Ser  ?Date Value Ref Range Status  ?08/02/2020 1.10 0.61 - 1.24 mg/dL Final  ?  ?  ?  ?  Passed - AST in normal range and within 360 days  ?  AST  ?Date Value Ref Range Status  ?08/02/2020 22 15 - 41 U/L Final  ?  ?  ?  ?  Passed - ALT in normal range and within 360 days  ?  ALT  ?Date Value Ref Range Status  ?08/02/2020 20 0 - 44 U/L Final  ?  ?  ?  ?  Passed - Completed PHQ-2 or PHQ-9 in the last 360 days  ?  ?  Passed - Last BP in normal range  ?  BP Readings from Last 1  Encounters:  ?02/10/21 (!) 125/58  ?  ?  ?  ?  Passed - Valid encounter within last 6 months  ?  Recent Outpatient Visits   ? ?      ? 5 months ago Pulmonary nodule  ? [facility]., MD  ? 6 months ago Encounter for annual wellness visit (AWV) in [place] patient  ? [facility] Jer[facility]., MD  ? 11 months ago Hyperglycemia  ? [facility] Jerrol Banana., MD  ? 1 year ago Skin lesion  ? [facility] [place], Dionne Bucy, MD  ? 1 year ago Benign prostatic hyperplasia, unspecified whether lower urinary tract symptoms present  ? [facility] Jerrol Banana., MD  ? ?  ?  ?Future Appointments   ? ?        ? In 3 weeks Jerrol Banana., MD [facility]  ? In 7 months Ralene Bathe, MD [place]  ? ?  ? ? ?  ?  ?  ? venlafaxine XR (EFFEXOR-XR) 75 MG 24 hr capsule 270 capsule 0  ?  Sig: TAKE 3 CAPSULES BY MOUTH EVERY MORNING  ?  ? Psychiatry: Antidepressants - SNRI - desvenlafaxine & venlafaxine Failed - 07/18/2021 10:41 AM  ?  ?  Failed - Lipid Panel in normal range within the last 12 months  ?  Cholesterol  ?Date Value Ref Range Status  ?08/02/2020 221 (H) 0 - 200 mg/dL Final  ? ?LDL Cholesterol  ?Date Value Ref Range Status  ?08/02/2020 136 (H) 0 - 99 mg/dL Final  ?  Comment:  ?         ?Total Cholesterol/HDL:CHD Risk ?Coronary Heart Disease Risk Table ?  Men   Women ? 1/2 Average Risk   3.4   3.3 ? Average Risk       5.0   4.4 ? 2 X Average Risk   9.6   7.1 ? 3 X Average Risk  23.4   11.0 ?       ?Use the calculated Patient Ratio ?above and the CHD Risk Table ?to determine the patient's CHD Risk. ?       ?ATP III CLASSIFICATION (LDL): ? <100     mg/dL   Optimal ? 100-129  mg/dL   Near or Above ?                   Optimal ? 130-159  mg/dL   Borderline ? 160-189  mg/dL   High ? >190     mg/dL   Very High ?Performed at [facility], [address] [place], [place] [address] ?  ? ?HDL  ?Date Value Ref Range Status  ?08/02/2020 56 >40 mg/dL Final  ? ?Triglycerides  ?Date Value Ref Range Status  ?08/02/2020 144 <150 mg/dL Final  ? ?  ?  ?  Passed - Cr in normal range and within 360 days  ?  Creatinine, Ser  ?Date Value Ref Range Status  ?08/02/2020 1.10 0.61 - 1.24 mg/dL Final  ?  ?  ?  ?  Passed - Completed PHQ-2 or PHQ-9 in the last 360 days  ?  ?  Passed - Last BP in normal range  ?  BP Readings from Last 1 Encounters:  ?02/10/21 (!) 125/58  ?  ?  ?  ?  Passed - Valid encounter within last 6 months  ?  Recent Outpatient Visits   ? ?      ? 5 months ago Pulmonary nodule  ? [facility] Jer[facility] Ban[facility]., MD  ? 6 months ago Encounter for annual wellness visit (AWV) in [place] patient  ? [facility] Jerrol Banana., MD  ? 11 months ago Hyperglycemia  ? [facility] Jerrol Banana., MD  ? 1 year ago Skin lesion  ? [facility] [place], Dionne Bucy, MD  ? 1 year ago Benign prostatic hyperplasia, unspecified whether lower urinary tract symptoms present  ? [facility] Jerrol Banana., MD  ? ?  ?  ?Future Appointments   ? ?        ? In 3 weeks Jerrol Banana., MD [facility]  ? In 7 months Ralene Bathe, MD [place]  ? ?  ? ? ?  ?  ?  ? traZODone (DESYREL) 50 MG tablet 90 tablet 0  ?  Sig: TAKE 1 TABLET AT BEDTIME AS NEEDED FOR SLEEP  ?  ? Psychiatry: Antidepressants - Serotonin Modulator Passed - 07/18/2021 10:41 AM  ?  ?  Passed - Completed PHQ-2 or PHQ-9 in the last 360 days  ?  ?  Passed - Valid encounter within last 6 months  ?  Recent Outpatient Visits   ? ?      ? 5 months ago Pulmonary nodule  ? [facility] Jerrol Banana., MD  ? 6 months ago Encounter for annual wellness visit (AWV) in [place] patient  ? [facility] Jerrol Banana., MD  ? 11 months ago  Hyperglycemia  ? [facility] Jerrol Banana., MD  ? 1 year ago  Skin lesion  ? [facility] [place], Dionne Bucy, MD  ? 1 year ago Benign prostatic hyperplasia, unspecified whether lower

## 2021-07-24 ENCOUNTER — Ambulatory Visit
Admission: EM | Admit: 2021-07-24 | Discharge: 2021-07-24 | Disposition: A | Discharge status: Home / Self Care | Payer: Medicare Other | Attending: Emergency Medicine | Admitting: Emergency Medicine

## 2021-07-24 DIAGNOSIS — L0882 Omphalitis not of newborn: Secondary | ICD-10-CM | POA: Diagnosis not present

## 2021-07-24 MED ORDER — CEPHALEXIN 500 MG PO CAPS: 500.0000 mg | ORAL_CAPSULE | Freq: Two times a day (BID) | ORAL | 0 refills | Status: AC

## 2021-07-24 NOTE — ED Provider Notes (Signed)
?[facility] ? ? ? ?CSN: [id number] ?Arrival date & time: 07/24/21  1003 ? ? ?  ? ?History   ?Chief Complaint ?Chief Complaint  ?Patient presents with  ? Abrasion  ? ? ?HPI ?Jeffrey French is a 71 y.o. male.  ? ?Patient presents with erythema and scabbing of the bellybutton for 1-1/2 weeks.  Symptoms started abruptly, unsure if he was scratched by an object, checked his clothing the day symptoms initially began and did not notice any has been cleansing daily with Betadine and alcohol but has seen no improvement in symptoms.  Endorses bloody drainage at some point but no purulent.  Denies fever, chills.  ? ?Past Medical History:  ?Diagnosis Date  ? A-fib ([place])   ? resolved with ablation  ? Actinic keratosis 04/16/2020  ? L upper abdomen - bx proven   ? BPH (benign prostatic hyperplasia)   ? GERD (gastroesophageal reflux disease)   ? Hypertension   ? Numbness of foot   ? Bilateral. since knee replacements  ? Sleep apnea   ? moderate.  unable to tolerate CPAP  ? ? ?Patient Active Problem List  ? Diagnosis Date Noted  ? Aortic atherosclerosis ([place]) 03/17/2021  ? OSA (obstructive sleep apnea) 05/03/2018  ? Acute on chronic systolic CHF (congestive heart failure) ([place]) 02/28/2018  ? On amiodarone therapy 02/27/2018  ? History of depression 02/26/2018  ? Status post ablation of atrial fibrillation 02/17/2018  ? Atrial fibrillation ([place]) 01/05/2018  ? Heart palpitations 10/21/2017  ? Diffuse cerebral atrophy ([place]) 03/20/2017  ? SOB (shortness of breath) on exertion 03/19/2017  ? New onset atrial flutter ([place]) 09/18/2015  ? Irregular heart rhythm 09/17/2015  ? Absolute anemia 02/18/2015  ? Benign fibroma of prostate 02/18/2015  ? Clinical depression 02/18/2015  ? Essential (primary) hypertension 02/18/2015  ? Anxiety, generalized 02/18/2015  ? Insomnia 02/18/2015  ? Arthritis, degenerative 02/18/2015  ? Peptic ulcer 02/18/2015  ? Elevated prostate specific antigen (PSA) 10/19/2013  ? ? ?Past Surgical History:   ?Procedure Laterality Date  ? ATRIAL FIBRILLATION ABLATION  02/26/2018  ? [facility]  ? CATARACT EXTRACTION W/PHACO Left 11/08/2018  ? Procedure: CATARACT EXTRACTION PHACO AND INTRAOCULAR LENS PLACEMENT ([place]) LEFT;  Surgeon: Birder Robson, MD;  Location: [place];  Service: Ophthalmology;  Laterality: Left;  sleep apnea ?not a [place] per [place]  ? CATARACT EXTRACTION W/PHACO Right 11/29/2018  ? Procedure: CATARACT EXTRACTION PHACO AND INTRAOCULAR LENS PLACEMENT ([facility]) RIGHT  00:50.9  17.4%  8.88;  Surgeon: Birder Robson, MD;  Location: [place];  Service: Ophthalmology;  Laterality: Right;  ? ELECTROPHYSIOLOGIC STUDY N/A 10/09/2015  ? Procedure: Cardioversion;  Surgeon: Isaias Cowman, MD;  Location: [facility];  Service: Cardiovascular;  Laterality: N/A;  ? NASAL RECONSTRUCTION WITH SEPTAL REPAIR    ? REPLACEMENT TOTAL KNEE Right   ? SHOULDER SURGERY Left   ? TONSILLECTOMY    ? ? ? ? ? ?Home Medications   ? ?Prior to Admission medications   ?Medication Sig Start Date End Date Taking? Authorizing Provider  ?alprazolam (XANAX) 2 MG tablet TAKE 1/2 TABLET BY MOUTH UP TO TWICE A DAY AS NEEDED. MAXIMUM 1 TABLET PER DAY. 04/10/21  Yes Birdie Sons, MD  ?amLODipine (NORVASC) 5 MG tablet Take 1 tablet (5 mg total) by mouth daily. 01/02/21  Yes Jerrol Banana., MD  ?apixaban (ELIQUIS) 5 MG TABS tablet Take 1 tablet (5 mg total) by mouth 2 (two) times daily. 01/02/21  Yes Jerrol Banana.,  MD  ?atorvastatin (LIPITOR) 40 MG tablet Take 1 tablet (40 mg total) by mouth daily. 03/14/21  Yes Tally Joe T, FNP  ?buPROPion (WELLBUTRIN XL) 300 MG 24 hr tablet TAKE 1 TABLET BY MOUTH DAILY 07/18/21  Yes Jerrol Banana., MD  ?cephALEXin (KEFLEX) 500 MG capsule Take 1 capsule (500 mg total) by mouth 2 (two) times daily for 5 days. 07/24/21 07/29/21 Yes Hans Eden, NP  ?Cholecalciferol (VITAMIN D) 2000 UNITS CAPS Take 1 capsule by mouth daily.  02/10/11  Yes [provider]   ?finasteride (PROSCAR) 5 MG tablet TAKE 1 TABLET BY MOUTH DAILY 04/02/21  Yes Jerrol Banana., MD  ?folic acid (FOLVITE) 619 MCG tablet Take 1 tablet (800 mcg total) by mouth daily. 12/27/19  Yes Jerrol Banana., MD  ?furosemide (LASIX) 20 MG tablet TAKE 1 TABLET (20 MG TOTAL) BY MOUTH ONCE DAILY. TAKE AN ADDITIONAL TABLET ASNEEDED FOR WEIGHT GAIN GREATER THAN 2 POUNDS A NIGHT. 04/04/18  Yes [provider]  ?lisinopril (ZESTRIL) 5 MG tablet TAKE 1 TABLET BY MOUTH DAILY 07/18/21  Yes Jerrol Banana., MD  ?Melatonin 1 MG TABS Take 2 mg by mouth at bedtime.    Yes [provider]  ?metoprolol succinate (TOPROL-XL) 50 MG 24 hr tablet Take 1 tablet (50 mg total) by mouth daily. Take with or immediately following a meal. 01/02/21  Yes Jerrol Banana., MD  ?omeprazole (PRILOSEC) 20 MG capsule TAKE 1 CAPSULE BY MOUTH DAILY 07/18/21  Yes Jerrol Banana., MD  ?tamsulosin (FLOMAX) 0.4 MG CAPS capsule TAKE 1 CAPSULE BY MOUTH DAILY 07/18/21  Yes Jerrol Banana., MD  ?venlafaxine XR (EFFEXOR-XR) 75 MG 24 hr capsule TAKE 3 CAPSULES BY MOUTH EVERY MORNING 07/18/21  Yes Jerrol Banana., MD  ?spironolactone (ALDACTONE) 25 MG tablet Take by mouth. 03/17/18 05/31/19  [provider]  ? ? ?Family History ?Family History  ?Problem Relation Age of Onset  ? Arthritis Mother   ? Hypertension Mother   ? Colon cancer Mother   ? Other Mother   ?     lymph node  ? Diabetes Father   ? Congestive Heart Failure Father   ? Arthritis Sister   ? ? ?Social History ?Social History  ? ?Tobacco Use  ? Smoking status: Former  ?  Types: Cigarettes  ?  Quit date: 37  ?  Years since quitting: 49.3  ? Smokeless tobacco: Never  ? Tobacco comments:  ?  1974  ?Vaping Use  ? Vaping Use: Never used  ?Substance Use Topics  ? Alcohol use: Yes  ?  Alcohol/week: 0.0 - 2.0 standard drinks  ? Drug use: No  ? ? ? ?Allergies   ?Sulfamethoxazole-trimethoprim ? ? ?Review of Systems ?Review of  Systems ?Defer to HPI  ? ? ?Physical Exam ?Triage Vital Signs ?ED Triage Vitals  ?Enc Vitals Group  ?   BP 07/24/21 1046 (!) 150/77  ?   Pulse Rate 07/24/21 1041 97  ?   Resp 07/24/21 1041 20  ?   Temp 07/24/21 1041 98.1 ?F (36.7 ?C)  ?   Temp src --   ?   SpO2 07/24/21 1041 99 %  ?   Weight --   ?   Height --   ?   Head Circumference --   ?   Peak Flow --   ?   Pain Score 07/24/21 1042 0  ?   Pain Loc --   ?  Pain Edu? --   ?   Excl. in [place]? --   ? ?No data found. ? ?Updated Vital Signs ?BP (!) 150/77 (BP Location: Left Arm)   Pulse 97   Temp 98.1 ?F (36.7 ?C)   Resp 20   SpO2 99%  ? ?Visual Acuity ?Right Eye Distance:   ?Left Eye Distance:   ?Bilateral Distance:   ? ?Right Eye Near:   ?Left Eye Near:    ?Bilateral Near:    ? ?Physical Exam ?Constitutional:   ?   Appearance: Normal appearance.  ?HENT:  ?   Head: Normocephalic.  ?Eyes:  ?   Extraocular Movements: Extraocular movements intact.  ?Skin: ?   Comments: Erythema and dryness noted to the umbilicus, nontender, nondraining  ?Neurological:  ?   Mental Status: He is alert and oriented to person, place, and time. Mental status is at baseline.  ?Psychiatric:     ?   Mood and Affect: Mood normal.     ?   Behavior: Behavior normal.  ? ? ? ?UC Treatments / Results  ?Labs ?(all labs ordered are listed, but only abnormal results are displayed) ?Labs Reviewed - No data to display ? ?EKG ? ? ?Radiology ?No results found. ? ?Procedures ?Procedures (including critical care time) ? ?Medications Ordered in UC ?Medications - No data to display ? ?Initial Impression / Assessment and Plan / UC Course  ?I have reviewed the triage vital signs and the nursing notes. ? ?Pertinent labs & imaging results that were available during my care of the patient were reviewed by me and considered in my medical decision making (see chart for details). ? ?Omphalitis in adult  ? ?Erythema noted to the umbilicus, will move forward with bacterial coverage, Keflex 5-day course prescribed,  recommended discontinuation of alcohol to prevent further irritation, may continue to cleanse with Betadine as needed, may follow-up with urgent care or PCP for persisting symptoms ?Final Clinical Impressions(s) / UC Diagnoses  ? ?Fi

## 2021-07-24 NOTE — Discharge Instructions (Signed)
Today you are being treated for an infection of your bellybutton ? ?Begin use of Keflex twice daily for 5 days, this medicine will help to clear any bacteria ? ?You may continue cleansing with Betadine as needed, please discontinue use of alcohol as this will slow the progression of healing ? ?You may follow-up with urgent care or with your primary care doctor if symptoms continue to worsen ?

## 2021-07-24 NOTE — ED Triage Notes (Signed)
Pt presents with scabbing of belly button x 1.5 weeks.  Has been cleaning it with betadine and alcohol but no improvement.  No draining but did bleed.  ?

## 2021-08-11 ENCOUNTER — Ambulatory Visit (INDEPENDENT_AMBULATORY_CARE_PROVIDER_SITE_OTHER): Payer: Medicare Other | Admitting: Family Medicine

## 2021-08-11 VITALS — BP 146/77 | HR 88 | Temp 98.4°F | Wt 222.0 lb

## 2021-08-11 DIAGNOSIS — G319 Degenerative disease of nervous system, unspecified: Secondary | ICD-10-CM | POA: Diagnosis not present

## 2021-08-11 DIAGNOSIS — G4733 Obstructive sleep apnea (adult) (pediatric): Secondary | ICD-10-CM

## 2021-08-11 DIAGNOSIS — K279 Peptic ulcer, site unspecified, unspecified as acute or chronic, without hemorrhage or perforation: Secondary | ICD-10-CM | POA: Diagnosis not present

## 2021-08-11 DIAGNOSIS — F3341 Major depressive disorder, recurrent, in partial remission: Secondary | ICD-10-CM

## 2021-08-11 DIAGNOSIS — I482 Chronic atrial fibrillation, unspecified: Secondary | ICD-10-CM

## 2021-08-11 DIAGNOSIS — E7800 Pure hypercholesterolemia, unspecified: Secondary | ICD-10-CM | POA: Diagnosis not present

## 2021-08-11 DIAGNOSIS — I10 Essential (primary) hypertension: Secondary | ICD-10-CM | POA: Diagnosis not present

## 2021-08-11 MED ORDER — ALPRAZOLAM 2 MG PO TABS: ORAL_TABLET | ORAL | 1 refills | Status: AC

## 2021-08-11 MED ORDER — LISINOPRIL 10 MG PO TABS: 10.0000 mg | ORAL_TABLET | Freq: Every day | ORAL | 3 refills | Status: AC

## 2021-08-11 NOTE — Progress Notes (Signed)
?  ? ? ?Established patient visit ? ? ?Patient: Jeffrey French   DOB: [birth year]   71 y.o. Male  MRN: [id number] ?Visit Date: 08/11/2021 ? ?Today's healthcare provider: [name], MD  ? ?No chief complaint on file. ? ?Subjective  ?  ?HPI  ?Patient comes in today for follow-up.  He feels well.  His depression and anxiety are doing fine.  He does need refill on his Xanax.  Does not take it frequently.  He needs labs done. ?He would prefer not to come off of any medications including antidepressants or anxiolytics.  He needs omeprazole for reflux control. ?Hypertension, follow-up ? ?BP Readings from Last 3 Encounters:  ?08/11/21 (!) 146/77  ?07/24/21 (!) 150/77  ?02/10/21 (!) 125/58  ? Wt Readings from Last 3 Encounters:  ?08/11/21 222 lb (100.7 kg)  ?02/10/21 223 lb (101.2 kg)  ?01/02/21 230 lb (104.3 kg)  ?  ? ?He was last seen for hypertension 6 months ago.  ?BP at that visit was as above . Management since that visit includes none. ? ? ?Symptoms: ?No chest pain No chest pressure  ?No palpitations No syncope  ?No dyspnea No orthopnea  ?No paroxysmal nocturnal dyspnea No lower extremity edema  ? ?Pertinent labs ?Lab Results  ?Component Value Date  ? CHOL 221 (H) 08/02/2020  ? HDL 56 08/02/2020  ? LDLCALC 136 (H) 08/02/2020  ? TRIG 144 08/02/2020  ? CHOLHDL 3.9 08/02/2020  ? Lab Results  ?Component Value Date  ? NA 141 08/02/2020  ? K 4.6 08/02/2020  ? CREATININE 1.10 08/02/2020  ? GFRNONAA >60 08/02/2020  ? GLUCOSE 104 (H) 08/02/2020  ? TSH 1.668 08/02/2020  ?  ? ?The 10-year ASCVD risk score (Arnett DK, et al., 2019) is: 29% ? ?--------------------------------------------------------------------------------------------------- ?Depression, Follow-up ? ?He  was last seen for this 6 months ago. ?Changes made at last visit include none. ? ? ? ?Patient would also like to get his Xanax refilled and get referral to dermatology for a lesion on his left shoulder ?  ? ? ?  08/11/2021  ? 10:50 AM 01/02/2021  ? 10:04 AM  07/31/2020  ? 10:15 AM  ?Depression screen PHQ 2/9  ?Decreased Interest 0 1 0  ?Down, Depressed, Hopeless 0 1 0  ?PHQ - 2 Score 0 2 0  ?Altered sleeping 2 2 0  ?Tired, decreased energy '1 1 1  '$ ?Change in appetite 0 0 0  ?Feeling bad or failure about yourself  0 0 0  ?Trouble concentrating 0 0 0  ?Moving slowly or fidgety/restless 0 0 0  ?Suicidal thoughts 0 0 0  ?PHQ-9 Score '3 5 1  '$ ?Difficult doing work/chores Not difficult at all Somewhat difficult Not difficult at all  ?  ?----------------------------------------------------------------------------------------- ? ? ?Medications: ?Outpatient Medications Prior to Visit  ?Medication Sig  ? alprazolam (XANAX) 2 MG tablet TAKE 1/2 TABLET BY MOUTH UP TO TWICE A DAY AS NEEDED. MAXIMUM 1 TABLET PER DAY.  ? amLODipine (NORVASC) 5 MG tablet Take 1 tablet (5 mg total) by mouth daily.  ? apixaban (ELIQUIS) 5 MG TABS tablet Take 1 tablet (5 mg total) by mouth 2 (two) times daily.  ? atorvastatin (LIPITOR) 40 MG tablet Take 1 tablet (40 mg total) by mouth daily.  ? buPROPion (WELLBUTRIN XL) 300 MG 24 hr tablet TAKE 1 TABLET BY MOUTH DAILY  ? Cholecalciferol (VITAMIN D) 2000 UNITS CAPS Take 1 capsule by mouth daily.   ? finasteride (PROSCAR) 5 MG tablet TAKE 1 TABLET  BY MOUTH DAILY  ? folic acid (FOLVITE) 827 MCG tablet Take 1 tablet (800 mcg total) by mouth daily.  ? furosemide (LASIX) 20 MG tablet TAKE 1 TABLET (20 MG TOTAL) BY MOUTH ONCE DAILY. TAKE AN ADDITIONAL TABLET ASNEEDED FOR WEIGHT GAIN GREATER THAN 2 POUNDS A NIGHT.  ? lisinopril (ZESTRIL) 5 MG tablet TAKE 1 TABLET BY MOUTH DAILY  ? Melatonin 1 MG TABS Take 2 mg by mouth at bedtime.   ? metoprolol succinate (TOPROL-XL) 50 MG 24 hr tablet Take 1 tablet (50 mg total) by mouth daily. Take with or immediately following a meal.  ? omeprazole (PRILOSEC) 20 MG capsule TAKE 1 CAPSULE BY MOUTH DAILY  ? tamsulosin (FLOMAX) 0.4 MG CAPS capsule TAKE 1 CAPSULE BY MOUTH DAILY  ? venlafaxine XR (EFFEXOR-XR) 75 MG 24 hr capsule TAKE 3  CAPSULES BY MOUTH EVERY MORNING  ? ?No facility-administered medications prior to visit.  ? ? ?Review of Systems ? ?Last hemoglobin A1c ?Lab Results  ?Component Value Date  ? HGBA1C 5.9 (H) 08/02/2020  ? ?  ?  Objective  ?  ?BP (!) 146/77 (BP Location: Right Arm, Patient Position: Sitting, Cuff Size: Normal)   Pulse 88   Temp 98.4 ?F (36.9 ?C) (Oral)   Wt 222 lb (100.7 kg)   SpO2 96%   BMI 27.75 kg/m?  ?BP Readings from Last 3 Encounters:  ?08/11/21 (!) 146/77  ?07/24/21 (!) 150/77  ?02/10/21 (!) 125/58  ? ?Wt Readings from Last 3 Encounters:  ?08/11/21 222 lb (100.7 kg)  ?02/10/21 223 lb (101.2 kg)  ?01/02/21 230 lb (104.3 kg)  ? ?  ? ?Physical Exam ?Vitals reviewed.  ?Constitutional:   ?   Appearance: He is well-developed.  ?HENT:  ?   Head: Normocephalic and atraumatic.  ?   Right Ear: External ear normal.  ?   Left Ear: External ear normal.  ?Eyes:  ?   General: No scleral icterus. ?   Conjunctiva/sclera: Conjunctivae normal.  ?Neck:  ?   Thyroid: No thyromegaly.  ?   Vascular: No carotid bruit.  ?Cardiovascular:  ?   Rate and Rhythm: Normal rate and regular rhythm.  ?   Heart sounds: Normal heart sounds.  ?Pulmonary:  ?   Effort: Pulmonary effort is normal.  ?   Breath sounds: Normal breath sounds.  ?Abdominal:  ?   Palpations: Abdomen is soft.  ?Lymphadenopathy:  ?   Cervical: No cervical adenopathy.  ?Skin: ?   General: Skin is warm and dry.  ?   Comments: Fair skin. Senile purpura.  ?Neurological:  ?   Mental Status: He is alert and oriented to person, place, and time. Mental status is at baseline.  ?Psychiatric:     ?   Mood and Affect: Mood normal.     ?   Behavior: Behavior normal.     ?   Thought Content: Thought content normal.     ?   Judgment: Judgment normal.  ?  ?\ ? ?No results found for any visits on 08/11/21. ? Assessment & Plan  ?  ? ?1. Essential (primary) hypertension ?We will increase lisinopril from 5 to 10 mg daily.  Follow-up in about 3 months ?- Comprehensive metabolic panel ?- CBC  with Differential/Platelet ?- TSH ?- lisinopril (ZESTRIL) 10 MG tablet; Take 1 tablet (10 mg total) by mouth daily.  Dispense: 90 tablet; Refill: 3 ? ?2. Recurrent major depressive disorder, in partial remission ([place]) ?Lifelong treatment.  Continue bupropion 300 mg daily and  Effexor 225 mg daily ? ?3. Pure hypercholesterolemia ?On atorvastatin 40 mg daily ?- Lipid Panel With LDL/HDL Ratio ? ?4. OSA (obstructive sleep apnea) ?Uses CPAP ? ?5. Chronic atrial fibrillation (HCC) ?On Eliquis ? ?6. Peptic ulcer ?On omeprazole ? ?7. Diffuse cerebral atrophy (HCC) ?MCI-MMSE on next visit ? ? ?No follow-ups on file.  ?   ? ?I, [name], MD, have reviewed all documentation for this visit. The documentation on 08/15/21 for the exam, diagnosis, procedures, and orders are all accurate and complete. ? ? ? ?Rosibel Giacobbe Cranford Mon, MD  ?[facility] ?[phone] (phone) ?[phone] (fax) ? ?[facility] ?

## 2021-08-20 DIAGNOSIS — I10 Essential (primary) hypertension: Secondary | ICD-10-CM | POA: Diagnosis not present

## 2021-08-20 DIAGNOSIS — E7800 Pure hypercholesterolemia, unspecified: Secondary | ICD-10-CM | POA: Diagnosis not present

## 2021-08-21 LAB — COMPREHENSIVE METABOLIC PANEL
ALT: 23 IU/L (ref 0–44)
AST: 33 IU/L (ref 0–40)
Albumin/Globulin Ratio: 1.6 (ref 1.2–2.2)
Albumin: 4.2 g/dL (ref 3.7–4.7)
Alkaline Phosphatase: 100 IU/L (ref 44–121)
BUN/Creatinine Ratio: 18 (ref 10–24)
BUN: 23 mg/dL (ref 8–27)
Bilirubin Total: 1.2 mg/dL (ref 0.0–1.2)
CO2: 24 mmol/L (ref 20–29)
Calcium: 9.5 mg/dL (ref 8.6–10.2)
Chloride: 105 mmol/L (ref 96–106)
Creatinine, Ser: 1.27 mg/dL (ref 0.76–1.27)
Globulin, Total: 2.6 g/dL (ref 1.5–4.5)
Glucose: 101 mg/dL — ABNORMAL HIGH (ref 70–99)
Potassium: 4.7 mmol/L (ref 3.5–5.2)
Sodium: 144 mmol/L (ref 134–144)
Total Protein: 6.8 g/dL (ref 6.0–8.5)
eGFR: 60 mL/min/{1.73_m2} (ref >59)

## 2021-08-21 LAB — CBC WITH DIFFERENTIAL/PLATELET
Basophils Absolute: 0.1 10*3/uL (ref 0.0–0.2)
Basos: 1 %
EOS (ABSOLUTE): 0.5 10*3/uL — ABNORMAL HIGH (ref 0.0–0.4)
Eos: 8 %
Hematocrit: 41.5 % (ref 37.5–51.0)
Hemoglobin: 13.8 g/dL (ref 13.0–17.7)
Immature Grans (Abs): 0 10*3/uL (ref 0.0–0.1)
Immature Granulocytes: 0 %
Lymphocytes Absolute: 1.9 10*3/uL (ref 0.7–3.1)
Lymphs: 29 %
MCH: 29.2 pg (ref 26.6–33.0)
MCHC: 33.3 g/dL (ref 31.5–35.7)
MCV: 88 fL (ref 79–97)
Monocytes Absolute: 0.6 10*3/uL (ref 0.1–0.9)
Monocytes: 9 %
Neutrophils Absolute: 3.4 10*3/uL (ref 1.4–7.0)
Neutrophils: 53 %
Platelets: 322 10*3/uL (ref 150–450)
RBC: 4.73 x10E6/uL (ref 4.14–5.80)
RDW: 13.3 % (ref 11.6–15.4)
WBC: 6.6 10*3/uL (ref 3.4–10.8)

## 2021-08-21 LAB — LIPID PANEL WITH LDL/HDL RATIO
Cholesterol, Total: 163 mg/dL (ref 100–199)
HDL: 56 mg/dL (ref >39)
LDL Chol Calc (NIH): 92 mg/dL (ref 0–99)
LDL/HDL Ratio: 1.6 ratio (ref 0.0–3.6)
Triglycerides: 81 mg/dL (ref 0–149)
VLDL Cholesterol Cal: 15 mg/dL (ref 5–40)

## 2021-08-21 LAB — TSH: TSH: 1.62 u[IU]/mL (ref 0.450–4.500)

## 2021-08-27 DIAGNOSIS — I420 Dilated cardiomyopathy: Secondary | ICD-10-CM | POA: Diagnosis not present

## 2021-08-27 DIAGNOSIS — Z9889 Other specified postprocedural states: Secondary | ICD-10-CM | POA: Diagnosis not present

## 2021-08-27 DIAGNOSIS — I5022 Chronic systolic (congestive) heart failure: Secondary | ICD-10-CM | POA: Diagnosis not present

## 2021-08-27 DIAGNOSIS — I4891 Unspecified atrial fibrillation: Secondary | ICD-10-CM | POA: Diagnosis not present

## 2021-08-27 DIAGNOSIS — I10 Essential (primary) hypertension: Secondary | ICD-10-CM | POA: Diagnosis not present

## 2021-08-27 DIAGNOSIS — I700 Atherosclerosis of aorta: Secondary | ICD-10-CM | POA: Diagnosis not present

## 2021-08-27 DIAGNOSIS — Z8679 Personal history of other diseases of the circulatory system: Secondary | ICD-10-CM | POA: Diagnosis not present

## 2021-09-19 DIAGNOSIS — I5022 Chronic systolic (congestive) heart failure: Secondary | ICD-10-CM | POA: Diagnosis not present

## 2021-09-19 DIAGNOSIS — I700 Atherosclerosis of aorta: Secondary | ICD-10-CM | POA: Diagnosis not present

## 2021-09-19 DIAGNOSIS — I420 Dilated cardiomyopathy: Secondary | ICD-10-CM | POA: Diagnosis not present

## 2021-09-19 DIAGNOSIS — I4891 Unspecified atrial fibrillation: Secondary | ICD-10-CM | POA: Diagnosis not present

## 2021-09-25 DIAGNOSIS — I77810 Thoracic aortic ectasia: Secondary | ICD-10-CM | POA: Diagnosis not present

## 2021-09-25 DIAGNOSIS — I700 Atherosclerosis of aorta: Secondary | ICD-10-CM | POA: Diagnosis not present

## 2021-09-25 DIAGNOSIS — I5022 Chronic systolic (congestive) heart failure: Secondary | ICD-10-CM | POA: Diagnosis not present

## 2021-09-25 DIAGNOSIS — I4819 Other persistent atrial fibrillation: Secondary | ICD-10-CM | POA: Diagnosis not present

## 2021-09-25 DIAGNOSIS — R0602 Shortness of breath: Secondary | ICD-10-CM | POA: Diagnosis not present

## 2021-09-25 DIAGNOSIS — E7800 Pure hypercholesterolemia, unspecified: Secondary | ICD-10-CM | POA: Diagnosis not present

## 2021-10-15 DIAGNOSIS — H903 Sensorineural hearing loss, bilateral: Secondary | ICD-10-CM | POA: Diagnosis not present

## 2021-11-07 DIAGNOSIS — T1501XA Foreign body in cornea, right eye, initial encounter: Secondary | ICD-10-CM | POA: Diagnosis not present

## 2021-11-07 DIAGNOSIS — H209 Unspecified iridocyclitis: Secondary | ICD-10-CM | POA: Diagnosis not present

## 2021-11-10 DIAGNOSIS — T1501XD Foreign body in cornea, right eye, subsequent encounter: Secondary | ICD-10-CM | POA: Diagnosis not present

## 2021-12-18 ENCOUNTER — Encounter: Payer: Self-pay | Admitting: Family Medicine

## 2021-12-18 ENCOUNTER — Ambulatory Visit (INDEPENDENT_AMBULATORY_CARE_PROVIDER_SITE_OTHER): Payer: Medicare Other | Admitting: Family Medicine

## 2021-12-18 VITALS — BP 128/68 | HR 74 | Resp 16 | Wt 214.0 lb

## 2021-12-18 DIAGNOSIS — I700 Atherosclerosis of aorta: Secondary | ICD-10-CM | POA: Diagnosis not present

## 2021-12-18 DIAGNOSIS — I10 Essential (primary) hypertension: Secondary | ICD-10-CM | POA: Diagnosis not present

## 2021-12-18 DIAGNOSIS — G319 Degenerative disease of nervous system, unspecified: Secondary | ICD-10-CM | POA: Diagnosis not present

## 2021-12-18 DIAGNOSIS — F3341 Major depressive disorder, recurrent, in partial remission: Secondary | ICD-10-CM

## 2021-12-18 DIAGNOSIS — I482 Chronic atrial fibrillation, unspecified: Secondary | ICD-10-CM

## 2021-12-18 DIAGNOSIS — G3184 Mild cognitive impairment, so stated: Secondary | ICD-10-CM | POA: Diagnosis not present

## 2021-12-18 NOTE — Progress Notes (Unsigned)
Established patient visit  I,[name],acting as a scribe for [name], MD.,have documented all relevant documentation on the behalf of [name], MD,as directed by  [name], MD while in the presence of [name], MD.   Patient: Jeffrey French   DOB: 26-Jan-1951   71 y.o. Male  MRN: [id number] Visit Date: 12/18/2021  Today's healthcare provider: [name], MD   No chief complaint on file.  Subjective    HPI  Patient feels well and has no complaints.  He states he is doing well with his medications.  Refuses to take MMSE.  He continues to drive and has had no problems and he does not cook much.  Hypertension, follow-up  BP Readings from Last 3 Encounters:  08/11/21 (!) 146/77  07/24/21 (!) 150/77  02/10/21 (!) 125/58   Wt Readings from Last 3 Encounters:  08/11/21 222 lb (100.7 kg)  02/10/21 223 lb (101.2 kg)  01/02/21 230 lb (104.3 kg)     He was last seen for hypertension 4 months ago.  Management since that visit includes; increased lisinopril from 5 to 10 mg daily.  Follow-up in about 3 months.  Outside blood pressures are 143/80.  -------------------------------------------------------------------------------------------------- Follow up for Diffuse cerebral atrophy ([place]):  The patient was last seen for this 4 months ago. Changes made at last visit include; MCI-MMSE on next visit.   Refused.  -----------------------------------------------------------------------------------------   Medications: Outpatient Medications Prior to Visit  Medication Sig   alprazolam (XANAX) 2 MG tablet TAKE 1/2 TABLET BY MOUTH UP TO TWICE A DAY AS NEEDED. MAXIMUM 1 TABLET PER DAY.   amLODipine (NORVASC) 5 MG tablet Take 1 tablet (5 mg total) by mouth daily.   apixaban (ELIQUIS) 5 MG TABS tablet Take 1 tablet (5 mg total) by mouth 2 (two) times daily.   atorvastatin (LIPITOR) 40 MG tablet Take 1 tablet (40 mg total) by mouth daily.    buPROPion (WELLBUTRIN XL) 300 MG 24 hr tablet TAKE 1 TABLET BY MOUTH DAILY   Cholecalciferol (VITAMIN D) 2000 UNITS CAPS Take 1 capsule by mouth daily.    finasteride (PROSCAR) 5 MG tablet TAKE 1 TABLET BY MOUTH DAILY   folic acid (FOLVITE) 751 MCG tablet Take 1 tablet (800 mcg total) by mouth daily.   furosemide (LASIX) 20 MG tablet TAKE 1 TABLET (20 MG TOTAL) BY MOUTH ONCE DAILY. TAKE AN ADDITIONAL TABLET ASNEEDED FOR WEIGHT GAIN GREATER THAN 2 POUNDS A NIGHT.   lisinopril (ZESTRIL) 10 MG tablet Take 1 tablet (10 mg total) by mouth daily.   Melatonin 1 MG TABS Take 2 mg by mouth at bedtime.    metoprolol succinate (TOPROL-XL) 50 MG 24 hr tablet Take 1 tablet (50 mg total) by mouth daily. Take with or immediately following a meal.   omeprazole (PRILOSEC) 20 MG capsule TAKE 1 CAPSULE BY MOUTH DAILY   tamsulosin (FLOMAX) 0.4 MG CAPS capsule TAKE 1 CAPSULE BY MOUTH DAILY   venlafaxine XR (EFFEXOR-XR) 75 MG 24 hr capsule TAKE 3 CAPSULES BY MOUTH EVERY MORNING   No facility-administered medications prior to visit.    Review of Systems  Constitutional:  Negative for appetite change, chills and fever.  Respiratory:  Negative for chest tightness, shortness of breath and wheezing.   Cardiovascular:  Negative for chest pain and palpitations.  Gastrointestinal:  Negative for abdominal pain, nausea and vomiting.        Objective    There were no vitals  taken for this visit. BP Readings from Last 3 Encounters:  12/18/21 128/68  08/11/21 (!) 146/77  07/24/21 (!) 150/77   Wt Readings from Last 3 Encounters:  12/18/21 214 lb (97.1 kg)  08/11/21 222 lb (100.7 kg)  02/10/21 223 lb (101.2 kg)      Physical Exam Vitals reviewed.  Constitutional:      Appearance: He is well-developed.  HENT:     Head: Normocephalic and atraumatic.     Right Ear: External ear normal.     Left Ear: External ear normal.  Eyes:     General: No scleral icterus.    Conjunctiva/sclera: Conjunctivae normal.   Neck:     Thyroid: No thyromegaly.     Vascular: No carotid bruit.  Cardiovascular:     Rate and Rhythm: Normal rate and regular rhythm.     Heart sounds: Normal heart sounds.  Pulmonary:     Effort: Pulmonary effort is normal.     Breath sounds: Normal breath sounds.  Abdominal:     Palpations: Abdomen is soft.  Lymphadenopathy:     Cervical: No cervical adenopathy.  Skin:    General: Skin is warm and dry.     Comments: Fair skin. Senile purpura.  Neurological:     Mental Status: He is alert and oriented to person, place, and time. Mental status is at baseline.  Psychiatric:        Mood and Affect: Mood normal.        Behavior: Behavior normal.        Thought Content: Thought content normal.       No results found for any visits on 12/18/21.  Assessment & Plan     1. Essential (primary) hypertension Blood pressure control good.  2. Diffuse cerebral atrophy ([place]) Followed by neurology  3. MCI (mild cognitive impairment) Consider donepezil going forward.  4. Recurrent major depressive disorder, in partial remission (HCC) On venlafaxine for many years along with bupropion.  May need this for life.  5. Chronic atrial fibrillation (HCC) On Eliquis  6. Aortic atherosclerosis (HCC) All risk factors treated, he is on atorvastatin 40   No follow-ups on file.      I, [name], MD, have reviewed all documentation for this visit. The documentation on 12/21/21 for the exam, diagnosis, procedures, and orders are all accurate and complete.    Tamarcus Condie Cranford Mon, MD  [facility] [phone] (phone) [phone] (fax)  [place]

## 2021-12-23 ENCOUNTER — Other Ambulatory Visit: Payer: Self-pay | Admitting: Family Medicine

## 2021-12-23 DIAGNOSIS — K279 Peptic ulcer, site unspecified, unspecified as acute or chronic, without hemorrhage or perforation: Secondary | ICD-10-CM

## 2021-12-24 NOTE — Telephone Encounter (Signed)
Requested Prescriptions  Pending Prescriptions Disp Refills  . metoprolol succinate (TOPROL-XL) 50 MG 24 hr tablet [Pharmacy Med Name: METOPROLOL SUCCINATE ER 50 MG TAB] 90 tablet 1    Sig: TAKE 1 TABLET BY MOUTH DAILY WITH OR IMMEDIATELY FOLLOWING A MEAL     Cardiovascular:  Beta Blockers Passed - 12/23/2021  1:30 PM      Passed - Last BP in normal range    BP Readings from Last 1 Encounters:  12/18/21 128/68         Passed - Last Heart Rate in normal range    Pulse Readings from Last 1 Encounters:  12/18/21 74         Passed - Valid encounter within last 6 months    Recent Outpatient Visits          6 days ago Essential (primary) hypertension   [facility] Jerrol Banana., MD   4 months ago Essential (primary) hypertension   [facility] Jerrol Banana., MD   10 months ago Pulmonary nodule   [facility] Jerrol Banana., MD   11 months ago Encounter for annual wellness visit (AWV) in Medicare patient   [facility] Jerrol Banana., MD   1 year ago Hyperglycemia   [facility] Jerrol Banana., MD      Future Appointments            In 1 month [name], MD [place]           . omeprazole (PRILOSEC) 20 MG capsule [Pharmacy Med Name: OMEPRAZOLE DR 20 MG CAP] 90 capsule 0    Sig: TAKE 1 CAPSULE BY MOUTH DAILY     Gastroenterology: Proton Pump Inhibitors Passed - 12/23/2021  1:30 PM      Passed - Valid encounter within last 12 months    Recent Outpatient Visits          6 days ago Essential (primary) hypertension   [facility] Jerrol Banana., MD   4 months ago Essential (primary) hypertension   [facility] Jerrol Banana., MD   10 months ago Pulmonary nodule   [facility] Jerrol Banana., MD   11 months ago Encounter for annual wellness visit (AWV) in Medicare patient    [facility] Jerrol Banana., MD   1 year ago Hyperglycemia   [facility] Jerrol Banana., MD      Future Appointments            In 1 month [name], MD [place]

## 2022-02-19 ENCOUNTER — Ambulatory Visit: Payer: Medicare Other | Admitting: Dermatology

## 2022-03-02 ENCOUNTER — Ambulatory Visit
Admission: EM | Admit: 2022-03-02 | Discharge: 2022-03-02 | Disposition: A | Discharge status: Home / Self Care | Payer: Medicare Other | Attending: Urgent Care | Admitting: Urgent Care

## 2022-03-02 DIAGNOSIS — L03313 Cellulitis of chest wall: Secondary | ICD-10-CM | POA: Diagnosis not present

## 2022-03-02 DIAGNOSIS — H43813 Vitreous degeneration, bilateral: Secondary | ICD-10-CM | POA: Diagnosis not present

## 2022-03-02 MED ORDER — CEPHALEXIN 500 MG PO CAPS: 500.0000 mg | ORAL_CAPSULE | Freq: Four times a day (QID) | ORAL | 0 refills | Status: AC

## 2022-03-02 MED ORDER — MUPIROCIN 2 % EX OINT: 1.0000 | TOPICAL_OINTMENT | Freq: Three times a day (TID) | CUTANEOUS | 0 refills | Status: AC

## 2022-03-02 NOTE — ED Triage Notes (Addendum)
Pt reports he has a boil like bump under his left breast since 3-4 days. Took no meds or used warm compresses. Painful to touch.   Small pinkish red boil with white on it.

## 2022-03-02 NOTE — Discharge Instructions (Addendum)
You have cellulitis of the chest wall.  Please take the antibiotic four times daily until gone. Apply moist heat to the area three times daily, followed with topical mupirocin. We sent out a wound culture. We will only call if the results indicate a need to change your treatment. Monitor for regression of the redness - if it extends outside the line drawn today, return for a recheck.

## 2022-03-02 NOTE — ED Provider Notes (Signed)
[facility]    CSN: [id number] Arrival date & time: 03/02/22  1236      History   Chief Complaint No chief complaint on file.   HPI Jeffrey French is a 71 y.o. male.   Pleasant 71 year old male presents today due to concern of a painful red spot on his left lateral chest just under his left breast.  States that has been there 3 to 4 days.  It has become increasingly tender, but it has not grown.  Noticed a white spot in the center of it just this morning.  Unknown cause, denies any prior skin issues such as this.  No trauma to the area.  No recent bites.  Has not tried any topical or oral medications at home.  Denies fever or any systemic symptoms.    Past Medical History:  Diagnosis Date  . A-fib ([place])    resolved with ablation  . Actinic keratosis 04/16/2020   L upper abdomen - bx proven   . BPH (benign prostatic hyperplasia)   . GERD (gastroesophageal reflux disease)   . Hypertension   . Numbness of foot    Bilateral. since knee replacements  . Sleep apnea    moderate.  unable to tolerate CPAP    Patient Active Problem List   Diagnosis Date Noted  . Aortic atherosclerosis ([place]) 03/17/2021  . OSA (obstructive sleep apnea) 05/03/2018  . Acute on chronic systolic CHF (congestive heart failure) ([place]) 02/28/2018  . History of depression 02/26/2018  . Status post ablation of atrial fibrillation 02/17/2018  . Atrial fibrillation ([place]) 01/05/2018  . Heart palpitations 10/21/2017  . Diffuse cerebral atrophy ([place]) 03/20/2017  . New onset atrial flutter ([place]) 09/18/2015  . Irregular heart rhythm 09/17/2015  . Absolute anemia 02/18/2015  . Benign fibroma of prostate 02/18/2015  . Clinical depression 02/18/2015  . Essential (primary) hypertension 02/18/2015  . Anxiety, generalized 02/18/2015  . Insomnia 02/18/2015  . Arthritis, degenerative 02/18/2015  . Peptic ulcer 02/18/2015    Past Surgical History:  Procedure Laterality Date  . ATRIAL FIBRILLATION  ABLATION  02/26/2018   [facility]  . CATARACT EXTRACTION W/PHACO Left 11/08/2018   Procedure: CATARACT EXTRACTION PHACO AND INTRAOCULAR LENS PLACEMENT ([place]) LEFT;  Surgeon: Birder Robson, MD;  Location: [place];  Service: Ophthalmology;  Laterality: Left;  sleep apnea not a [place] per [place]  . CATARACT EXTRACTION W/PHACO Right 11/29/2018   Procedure: CATARACT EXTRACTION PHACO AND INTRAOCULAR LENS PLACEMENT ([facility]) RIGHT  00:50.9  17.4%  8.88;  Surgeon: Birder Robson, MD;  Location: [place];  Service: Ophthalmology;  Laterality: Right;  . ELECTROPHYSIOLOGIC STUDY N/A 10/09/2015   Procedure: Cardioversion;  Surgeon: Isaias Cowman, MD;  Location: [facility];  Service: Cardiovascular;  Laterality: N/A;  . NASAL RECONSTRUCTION WITH SEPTAL REPAIR    . REPLACEMENT TOTAL KNEE Right   . SHOULDER SURGERY Left   . TONSILLECTOMY         Home Medications    Prior to Admission medications   Medication Sig Start Date End Date Taking? Authorizing Provider  alprazolam (XANAX) 2 MG tablet TAKE 1/2 TABLET BY MOUTH UP TO TWICE A DAY AS NEEDED. MAXIMUM 1 TABLET PER DAY. 08/11/21   Jerrol Banana., MD  amLODipine (NORVASC) 5 MG tablet Take 1 tablet (5 mg total) by mouth daily. 01/02/21   Jerrol Banana., MD  apixaban (ELIQUIS) 5 MG TABS tablet Take 1 tablet (5 mg total) by mouth 2 (two) times daily. 01/02/21  Jerrol Banana., MD  buPROPion (WELLBUTRIN XL) 300 MG 24 hr tablet TAKE 1 TABLET BY MOUTH DAILY 07/18/21   Jerrol Banana., MD  Cholecalciferol (VITAMIN D) 2000 UNITS CAPS Take 1 capsule by mouth daily.  02/10/11   [provider]  finasteride (PROSCAR) 5 MG tablet TAKE 1 TABLET BY MOUTH DAILY 04/02/21   Jerrol Banana., MD  folic acid (FOLVITE) 657 MCG tablet Take 1 tablet (800 mcg total) by mouth daily. 12/27/19   Jerrol Banana., MD  furosemide (LASIX) 20 MG tablet TAKE 1 TABLET (20 MG TOTAL) BY MOUTH ONCE DAILY. TAKE AN ADDITIONAL  TABLET ASNEEDED FOR WEIGHT GAIN GREATER THAN 2 POUNDS A NIGHT. 04/04/18   [provider]  lisinopril (ZESTRIL) 10 MG tablet Take 1 tablet (10 mg total) by mouth daily. 08/11/21   Jerrol Banana., MD  Melatonin 1 MG TABS Take 2 mg by mouth at bedtime.     [provider]  metoprolol succinate (TOPROL-XL) 50 MG 24 hr tablet TAKE 1 TABLET BY MOUTH DAILY WITH OR IMMEDIATELY FOLLOWING A MEAL 12/24/21   Jerrol Banana., MD  omeprazole (PRILOSEC) 20 MG capsule TAKE 1 CAPSULE BY MOUTH DAILY 12/24/21   Jerrol Banana., MD  tamsulosin (FLOMAX) 0.4 MG CAPS capsule TAKE 1 CAPSULE BY MOUTH DAILY 07/18/21   Jerrol Banana., MD  venlafaxine XR (EFFEXOR-XR) 75 MG 24 hr capsule TAKE 3 CAPSULES BY MOUTH EVERY MORNING 07/18/21   Jerrol Banana., MD  spironolactone (ALDACTONE) 25 MG tablet Take by mouth. 03/17/18 05/31/19  [provider]    Family History Family History  Problem Relation Age of Onset  . Arthritis Mother   . Hypertension Mother   . Colon cancer Mother   . Other Mother        lymph node  . Diabetes Father   . Congestive Heart Failure Father   . Arthritis Sister     Social History Social History   Tobacco Use  . Smoking status: Former    Types: Cigarettes    Quit date: 1974    Years since quitting: 49.9  . Smokeless tobacco: Never  . Tobacco comments:    1974  Vaping Use  . Vaping Use: Never used  Substance Use Topics  . Alcohol use: Yes    Alcohol/week: 0.0 - 2.0 standard drinks of alcohol  . Drug use: No     Allergies   Sulfamethoxazole-trimethoprim   Review of Systems Review of Systems   Physical Exam Triage Vital Signs ED Triage Vitals  Enc Vitals Group     BP 03/02/22 1305 (!) 122/94     Pulse Rate 03/02/22 1305 69     Resp 03/02/22 1305 20     Temp 03/02/22 1305 98.6 F (37 C)     Temp Source 03/02/22 1305 Oral     SpO2 03/02/22 1305 99 %     Weight --      Height --      Head Circumference --       Peak Flow --      Pain Score 03/02/22 1311 5     Pain Loc --      Pain Edu? --      Excl. in [place]? --    No data found.  Updated Vital Signs BP (!) 122/94 (BP Location: Left Arm)   Pulse 69   Temp 98.6 F (37 C) (Oral)  Resp 20   SpO2 99%   Visual Acuity Right Eye Distance:   Left Eye Distance:   Bilateral Distance:    Right Eye Near:   Left Eye Near:    Bilateral Near:     Physical Exam   UC Treatments / Results  Labs (all labs ordered are listed, but only abnormal results are displayed) Labs Reviewed - No data to display  EKG   Radiology No results found.  Procedures Procedures (including critical care time)  Medications Ordered in UC Medications - No data to display  Initial Impression / Assessment and Plan / UC Course  I have reviewed the triage vital signs and the nursing notes.  Pertinent labs & imaging results that were available during my care of the patient were reviewed by me and considered in my medical decision making (see chart for details).     *** Final Clinical Impressions(s) / UC Diagnoses   Final diagnoses:  None   Discharge Instructions   None    ED Prescriptions   None    PDMP not reviewed this encounter.

## 2022-03-03 ENCOUNTER — Telehealth: Payer: Self-pay | Admitting: Family Medicine

## 2022-03-03 DIAGNOSIS — N400 Enlarged prostate without lower urinary tract symptoms: Secondary | ICD-10-CM

## 2022-03-03 MED ORDER — TAMSULOSIN HCL 0.4 MG PO CAPS: 0.4000 mg | ORAL_CAPSULE | Freq: Every day | ORAL | 0 refills | Status: DC

## 2022-03-03 MED ORDER — FINASTERIDE 5 MG PO TABS: 5.0000 mg | ORAL_TABLET | Freq: Every day | ORAL | 0 refills | Status: AC

## 2022-03-03 NOTE — Telephone Encounter (Signed)
[place] faxed refill request for the following medications:   finasteride (PROSCAR) 5 MG tablet    tamsulosin (FLOMAX) 0.4 MG CAPS capsule   Please advise

## 2022-03-03 NOTE — Telephone Encounter (Signed)
Refill for    venlafaxine XR (EFFEXOR-XR) 75 MG 24 hr capsule [[id number]]    And finasteride (PROSCAR) 5 MG tablet     And  Burpropion HCL ER 300

## 2022-03-03 NOTE — Telephone Encounter (Signed)
Finasteride 5 MG 90 day supply

## 2022-03-03 NOTE — Telephone Encounter (Signed)
Refill for   venlafaxine XR (EFFEXOR-XR) 75 MG 24 hr capsule [[id number]]   And finasteride (PROSCAR) 5 MG tablet

## 2022-03-05 ENCOUNTER — Other Ambulatory Visit: Payer: Self-pay | Admitting: Physician Assistant

## 2022-03-05 DIAGNOSIS — F32A Depression, unspecified: Secondary | ICD-10-CM

## 2022-03-05 MED ORDER — BUPROPION HCL ER (XL) 300 MG PO TB24: 300.0000 mg | ORAL_TABLET | Freq: Every day | ORAL | 0 refills | Status: AC

## 2022-03-05 MED ORDER — VENLAFAXINE HCL ER 75 MG PO CP24: 225.0000 mg | ORAL_CAPSULE | Freq: Every morning | ORAL | 0 refills | Status: AC

## 2022-03-05 NOTE — Telephone Encounter (Signed)
Pt called and requested a refill on all the following states he is taking and didn't want to make an appt at this time as hw was here in September to see Dr. Rosanna Randy.

## 2022-03-05 NOTE — Telephone Encounter (Signed)
Filled wellbutrin and venlafaxine. Finasteride was filled 03/03/2022

## 2022-03-08 LAB — AEROBIC/ANAEROBIC CULTURE W GRAM STAIN (SURGICAL/DEEP WOUND)

## 2022-03-23 ENCOUNTER — Telehealth: Payer: Self-pay | Admitting: Family Medicine

## 2022-03-23 DIAGNOSIS — N400 Enlarged prostate without lower urinary tract symptoms: Secondary | ICD-10-CM

## 2022-03-23 NOTE — Telephone Encounter (Signed)
Patient requested rx to soon  [facility] 03/03/22 #[phone]

## 2022-03-23 NOTE — Telephone Encounter (Signed)
[place] faxed refill request for the following medications:   tamsulosin (FLOMAX) 0.4 MG CAPS capsule [    Please advise

## 2022-03-24 ENCOUNTER — Telehealth: Payer: Self-pay | Admitting: Family Medicine

## 2022-03-24 DIAGNOSIS — N400 Enlarged prostate without lower urinary tract symptoms: Secondary | ICD-10-CM

## 2022-03-24 MED ORDER — TAMSULOSIN HCL 0.4 MG PO CAPS: 0.4000 mg | ORAL_CAPSULE | Freq: Every day | ORAL | 0 refills | Status: AC

## 2022-03-24 NOTE — Telephone Encounter (Signed)
[facility] faxed refill request for the following medications:   tamsulosin (FLOMAX) 0.4 MG CAPS capsule    Please advise

## 2022-03-26 DIAGNOSIS — I10 Essential (primary) hypertension: Secondary | ICD-10-CM | POA: Diagnosis not present

## 2022-03-26 DIAGNOSIS — Z23 Encounter for immunization: Secondary | ICD-10-CM | POA: Diagnosis not present

## 2022-03-26 DIAGNOSIS — I420 Dilated cardiomyopathy: Secondary | ICD-10-CM | POA: Diagnosis not present

## 2022-03-26 DIAGNOSIS — I5022 Chronic systolic (congestive) heart failure: Secondary | ICD-10-CM | POA: Diagnosis not present

## 2022-03-26 DIAGNOSIS — R0602 Shortness of breath: Secondary | ICD-10-CM | POA: Diagnosis not present

## 2022-03-26 DIAGNOSIS — I4891 Unspecified atrial fibrillation: Secondary | ICD-10-CM | POA: Diagnosis not present

## 2022-03-26 DIAGNOSIS — I77810 Thoracic aortic ectasia: Secondary | ICD-10-CM | POA: Diagnosis not present

## 2022-03-26 DIAGNOSIS — E7800 Pure hypercholesterolemia, unspecified: Secondary | ICD-10-CM | POA: Diagnosis not present

## 2022-04-10 ENCOUNTER — Encounter: Payer: Self-pay | Admitting: Physician Assistant

## 2022-04-10 ENCOUNTER — Ambulatory Visit (INDEPENDENT_AMBULATORY_CARE_PROVIDER_SITE_OTHER): Payer: Medicare Other | Admitting: Physician Assistant

## 2022-04-10 VITALS — BP 127/72 | HR 86 | Temp 98.6°F | Resp 16 | Wt 219.0 lb

## 2022-04-10 DIAGNOSIS — J0110 Acute frontal sinusitis, unspecified: Secondary | ICD-10-CM

## 2022-04-10 DIAGNOSIS — R051 Acute cough: Secondary | ICD-10-CM | POA: Diagnosis not present

## 2022-04-10 LAB — POCT INFLUENZA A/B
Influenza A, POC: NEGATIVE
Influenza B, POC: NEGATIVE

## 2022-04-10 LAB — POC COVID19 BINAXNOW: SARS Coronavirus 2 Ag: NEGATIVE

## 2022-04-10 MED ORDER — AZELASTINE HCL 0.1 % NA SOLN: 1.0000 | Freq: Two times a day (BID) | NASAL | 1 refills | Status: AC

## 2022-04-10 MED ORDER — AMOXICILLIN 875 MG PO TABS: 875.0000 mg | ORAL_TABLET | Freq: Two times a day (BID) | ORAL | 0 refills | Status: AC

## 2022-04-10 NOTE — Progress Notes (Signed)
I,Sulibeya S Dimas,acting as a Education administrator for [employer], PA-C.,have documented all relevant documentation on the behalf of Mikey Kirschner, PA-C,as directed by  Mikey Kirschner, PA-C while in the presence of Mikey Kirschner, PA-C.     Established patient visit   Patient: Jeffrey French   DOB: 06/21/1950   72 y.o. Male  MRN: [id number] Visit Date: 04/10/2022  Today's healthcare provider: Mikey Kirschner, PA-C   Chief Complaint  Patient presents with   URI   Subjective    HPI  Upper respiratory symptoms He complains of achiness, congestion, nasal congestion, post nasal drip, productive cough with  yellow colored sputum, purulent nasal discharge, and sinus pressure.with no fever, chills, night sweats or weight loss. Onset of symptoms was  5 days ago and gradually worsening.He is drinking plenty of fluids.  Past history is significant for no history of pneumonia or bronchitis. Patient is non-smoker. Patient reports he tested negative for COVID 2 days ago.  ---------------------------------------------------------------------------------------------------   Medications: Outpatient Medications Prior to Visit  Medication Sig   alprazolam (XANAX) 2 MG tablet TAKE 1/2 TABLET BY MOUTH UP TO TWICE A DAY AS NEEDED. MAXIMUM 1 TABLET PER DAY.   amLODipine (NORVASC) 5 MG tablet Take 1 tablet (5 mg total) by mouth daily.   apixaban (ELIQUIS) 5 MG TABS tablet Take 1 tablet (5 mg total) by mouth 2 (two) times daily.   buPROPion (WELLBUTRIN XL) 300 MG 24 hr tablet Take 1 tablet (300 mg total) by mouth daily.   Cholecalciferol (VITAMIN D) 2000 UNITS CAPS Take 1 capsule by mouth daily.    finasteride (PROSCAR) 5 MG tablet Take 1 tablet (5 mg total) by mouth daily.   folic acid (FOLVITE) 734 MCG tablet Take 1 tablet (800 mcg total) by mouth daily.   furosemide (LASIX) 20 MG tablet TAKE 1 TABLET (20 MG TOTAL) BY MOUTH ONCE DAILY. TAKE AN ADDITIONAL TABLET ASNEEDED FOR WEIGHT GAIN GREATER THAN 2 POUNDS A  NIGHT.   lisinopril (ZESTRIL) 10 MG tablet Take 1 tablet (10 mg total) by mouth daily.   Melatonin 1 MG TABS Take 2 mg by mouth at bedtime.    metoprolol succinate (TOPROL-XL) 50 MG 24 hr tablet TAKE 1 TABLET BY MOUTH DAILY WITH OR IMMEDIATELY FOLLOWING A MEAL   mupirocin ointment (BACTROBAN) 2 % Apply 1 Application topically 3 (three) times daily.   omeprazole (PRILOSEC) 20 MG capsule TAKE 1 CAPSULE BY MOUTH DAILY   tamsulosin (FLOMAX) 0.4 MG CAPS capsule Take 1 capsule (0.4 mg total) by mouth daily.   venlafaxine XR (EFFEXOR-XR) 75 MG 24 hr capsule Take 3 capsules (225 mg total) by mouth every morning.   No facility-administered medications prior to visit.    Review of Systems  Constitutional:  Negative for chills and fatigue.  HENT:  Positive for congestion, postnasal drip, rhinorrhea, sinus pressure, sinus pain, sneezing and sore throat. Negative for ear pain.   Respiratory:  Positive for cough. Negative for shortness of breath and wheezing.   Gastrointestinal:  Negative for abdominal pain, nausea and vomiting.  Musculoskeletal:  Positive for myalgias.  Neurological:  Positive for headaches.     Objective    BP 127/72 (BP Location: Left Arm, Patient Position: Sitting, Cuff Size: Large)   Pulse 86   Temp 98.6 F (37 C) (Oral)   Resp 16   Wt 219 lb (99.3 kg)   SpO2 99%   BMI 27.37 kg/m  BP Readings from Last 3 Encounters:  04/10/22 127/72  03/02/22 (!) 122/94  12/18/21 128/68   Wt Readings from Last 3 Encounters:  04/10/22 219 lb (99.3 kg)  12/18/21 214 lb (97.1 kg)  08/11/21 222 lb (100.7 kg)    Physical Exam Constitutional:      General: He is awake.     Appearance: He is well-developed.  HENT:     Head: Normocephalic.     Nose: Congestion present.     Mouth/Throat:     Pharynx: Posterior oropharyngeal erythema present. No oropharyngeal exudate.  Eyes:     Conjunctiva/sclera: Conjunctivae normal.  Cardiovascular:     Rate and Rhythm: Normal rate and regular  rhythm.     Heart sounds: Normal heart sounds.  Pulmonary:     Effort: Pulmonary effort is normal.     Breath sounds: Normal breath sounds. No wheezing, rhonchi or rales.  Skin:    General: Skin is warm.  Neurological:     Mental Status: He is alert and oriented to person, place, and time.  Psychiatric:        Attention and Perception: Attention normal.        Mood and Affect: Mood normal.        Speech: Speech normal.        Behavior: Behavior is cooperative.      Results for orders placed or performed in visit on 04/10/22  POC COVID-19  Result Value Ref Range   SARS Coronavirus 2 Ag Negative Negative  POCT Influenza A/B  Result Value Ref Range   Influenza A, POC Negative Negative   Influenza B, POC Negative Negative    Assessment & Plan     Acute sinusitis Poc flu a/b negative poc covid negative Advised fluids, rest, mucinex Rx azelastine nasal spray Advised he could improve within the next 1-2 days, but if not rx amoxicillin 875 mg bid x 7 days   Return if symptoms worsen or fail to improve.      I, Mikey Kirschner, PA-C have reviewed all documentation for this visit. The documentation on  04/10/22 for the exam, diagnosis, procedures, and orders are all accurate and complete.  Mikey Kirschner, PA-C [facility] [address] [place], [place], [address] Office: [phone] Fax: [place]

## 2022-04-29 ENCOUNTER — Other Ambulatory Visit: Payer: Self-pay

## 2022-04-29 ENCOUNTER — Telehealth: Payer: Self-pay | Admitting: Family Medicine

## 2022-04-29 DIAGNOSIS — K279 Peptic ulcer, site unspecified, unspecified as acute or chronic, without hemorrhage or perforation: Secondary | ICD-10-CM

## 2022-04-29 MED ORDER — OMEPRAZOLE 20 MG PO CPDR: 20.0000 mg | DELAYED_RELEASE_CAPSULE | Freq: Every day | ORAL | 0 refills | Status: AC

## 2022-04-29 NOTE — Telephone Encounter (Signed)
[facility] faxed refill request for the following medications:  omeprazole (PRILOSEC) 20 MG capsule    Please advise.

## 2022-04-29 NOTE — Telephone Encounter (Signed)
Refill sent.  KP

## 2022-04-30 ENCOUNTER — Telehealth: Payer: Self-pay | Admitting: Family Medicine

## 2022-04-30 NOTE — Telephone Encounter (Signed)
[facility] faxed refill request for the following medications:  apixaban (ELIQUIS) 5 MG TABS tablet     Please advise.

## 2022-05-04 NOTE — Telephone Encounter (Signed)
Patient made aware.

## 2022-06-02 DIAGNOSIS — I110 Hypertensive heart disease with heart failure: Secondary | ICD-10-CM | POA: Diagnosis not present

## 2022-06-02 DIAGNOSIS — I5022 Chronic systolic (congestive) heart failure: Secondary | ICD-10-CM | POA: Diagnosis not present

## 2022-06-02 DIAGNOSIS — I4891 Unspecified atrial fibrillation: Secondary | ICD-10-CM | POA: Diagnosis not present

## 2022-06-02 DIAGNOSIS — R053 Chronic cough: Secondary | ICD-10-CM | POA: Diagnosis not present

## 2022-06-02 DIAGNOSIS — I10 Essential (primary) hypertension: Secondary | ICD-10-CM | POA: Diagnosis not present

## 2022-06-02 DIAGNOSIS — I509 Heart failure, unspecified: Secondary | ICD-10-CM | POA: Diagnosis not present

## 2022-06-02 DIAGNOSIS — F0280 Dementia in other diseases classified elsewhere without behavioral disturbance: Secondary | ICD-10-CM | POA: Diagnosis not present

## 2022-06-02 DIAGNOSIS — E7800 Pure hypercholesterolemia, unspecified: Secondary | ICD-10-CM | POA: Diagnosis not present

## 2022-06-02 DIAGNOSIS — G319 Degenerative disease of nervous system, unspecified: Secondary | ICD-10-CM | POA: Diagnosis not present

## 2022-06-02 DIAGNOSIS — Z8659 Personal history of other mental and behavioral disorders: Secondary | ICD-10-CM | POA: Diagnosis not present

## 2022-06-02 DIAGNOSIS — G309 Alzheimer's disease, unspecified: Secondary | ICD-10-CM | POA: Diagnosis not present

## 2022-06-02 DIAGNOSIS — G4733 Obstructive sleep apnea (adult) (pediatric): Secondary | ICD-10-CM | POA: Diagnosis not present

## 2022-06-30 DIAGNOSIS — F0280 Dementia in other diseases classified elsewhere without behavioral disturbance: Secondary | ICD-10-CM | POA: Diagnosis not present

## 2022-06-30 DIAGNOSIS — I4819 Other persistent atrial fibrillation: Secondary | ICD-10-CM | POA: Diagnosis not present

## 2022-06-30 DIAGNOSIS — I420 Dilated cardiomyopathy: Secondary | ICD-10-CM | POA: Diagnosis not present

## 2022-06-30 DIAGNOSIS — M79602 Pain in left arm: Secondary | ICD-10-CM | POA: Diagnosis not present

## 2022-06-30 DIAGNOSIS — G309 Alzheimer's disease, unspecified: Secondary | ICD-10-CM | POA: Diagnosis not present

## 2022-06-30 DIAGNOSIS — G319 Degenerative disease of nervous system, unspecified: Secondary | ICD-10-CM | POA: Diagnosis not present

## 2022-06-30 DIAGNOSIS — Z23 Encounter for immunization: Secondary | ICD-10-CM | POA: Diagnosis not present

## 2022-06-30 DIAGNOSIS — I10 Essential (primary) hypertension: Secondary | ICD-10-CM | POA: Diagnosis not present

## 2022-06-30 DIAGNOSIS — F3341 Major depressive disorder, recurrent, in partial remission: Secondary | ICD-10-CM | POA: Diagnosis not present

## 2022-06-30 DIAGNOSIS — M5033 Other cervical disc degeneration, cervicothoracic region: Secondary | ICD-10-CM | POA: Diagnosis not present

## 2022-08-13 DIAGNOSIS — G309 Alzheimer's disease, unspecified: Secondary | ICD-10-CM | POA: Diagnosis not present

## 2022-08-13 DIAGNOSIS — M79602 Pain in left arm: Secondary | ICD-10-CM | POA: Diagnosis not present

## 2022-08-13 DIAGNOSIS — F0283 Dementia in other diseases classified elsewhere, unspecified severity, with mood disturbance: Secondary | ICD-10-CM | POA: Diagnosis not present

## 2022-08-13 DIAGNOSIS — F3341 Major depressive disorder, recurrent, in partial remission: Secondary | ICD-10-CM | POA: Diagnosis not present

## 2022-08-13 DIAGNOSIS — M5412 Radiculopathy, cervical region: Secondary | ICD-10-CM | POA: Diagnosis not present

## 2022-08-20 ENCOUNTER — Other Ambulatory Visit: Payer: Self-pay | Admitting: Family Medicine

## 2022-08-20 DIAGNOSIS — M542 Cervicalgia: Secondary | ICD-10-CM

## 2022-08-21 ENCOUNTER — Ambulatory Visit
Admission: RE | Admit: 2022-08-21 | Discharge: 2022-08-21 | Disposition: A | Discharge status: Home / Self Care | Payer: Medicare Other | Source: Ambulatory Visit | Attending: Family Medicine | Admitting: Family Medicine

## 2022-08-21 DIAGNOSIS — R200 Anesthesia of skin: Secondary | ICD-10-CM | POA: Diagnosis not present

## 2022-08-21 DIAGNOSIS — M542 Cervicalgia: Secondary | ICD-10-CM | POA: Diagnosis not present

## 2022-09-01 DIAGNOSIS — J340 Abscess, furuncle and carbuncle of nose: Secondary | ICD-10-CM | POA: Diagnosis not present

## 2022-09-17 DIAGNOSIS — J340 Abscess, furuncle and carbuncle of nose: Secondary | ICD-10-CM | POA: Diagnosis not present

## 2022-09-22 DIAGNOSIS — I790 Aneurysm of aorta in diseases classified elsewhere: Secondary | ICD-10-CM | POA: Diagnosis not present

## 2022-09-22 DIAGNOSIS — I779 Disorder of arteries and arterioles, unspecified: Secondary | ICD-10-CM | POA: Diagnosis not present

## 2022-09-22 DIAGNOSIS — I77810 Thoracic aortic ectasia: Secondary | ICD-10-CM | POA: Diagnosis not present

## 2022-10-06 DIAGNOSIS — I779 Disorder of arteries and arterioles, unspecified: Secondary | ICD-10-CM | POA: Diagnosis not present

## 2022-10-06 DIAGNOSIS — I77810 Thoracic aortic ectasia: Secondary | ICD-10-CM | POA: Diagnosis not present

## 2022-10-06 DIAGNOSIS — I6523 Occlusion and stenosis of bilateral carotid arteries: Secondary | ICD-10-CM | POA: Diagnosis not present

## 2022-10-06 DIAGNOSIS — I790 Aneurysm of aorta in diseases classified elsewhere: Secondary | ICD-10-CM | POA: Diagnosis not present

## 2022-10-20 ENCOUNTER — Other Ambulatory Visit: Payer: Self-pay | Admitting: Family Medicine

## 2022-10-20 DIAGNOSIS — N400 Enlarged prostate without lower urinary tract symptoms: Secondary | ICD-10-CM

## 2022-10-20 DIAGNOSIS — M25512 Pain in left shoulder: Secondary | ICD-10-CM | POA: Diagnosis not present

## 2022-10-20 DIAGNOSIS — G8929 Other chronic pain: Secondary | ICD-10-CM | POA: Diagnosis not present

## 2022-10-20 DIAGNOSIS — F3341 Major depressive disorder, recurrent, in partial remission: Secondary | ICD-10-CM | POA: Diagnosis not present

## 2022-10-20 DIAGNOSIS — I4819 Other persistent atrial fibrillation: Secondary | ICD-10-CM | POA: Diagnosis not present

## 2022-10-20 DIAGNOSIS — F0283 Dementia in other diseases classified elsewhere, unspecified severity, with mood disturbance: Secondary | ICD-10-CM | POA: Diagnosis not present

## 2022-10-20 DIAGNOSIS — M5412 Radiculopathy, cervical region: Secondary | ICD-10-CM | POA: Diagnosis not present

## 2022-10-20 DIAGNOSIS — G309 Alzheimer's disease, unspecified: Secondary | ICD-10-CM | POA: Diagnosis not present

## 2022-10-20 DIAGNOSIS — I420 Dilated cardiomyopathy: Secondary | ICD-10-CM | POA: Diagnosis not present

## 2022-10-26 DIAGNOSIS — M67912 Unspecified disorder of synovium and tendon, left shoulder: Secondary | ICD-10-CM | POA: Diagnosis not present

## 2022-10-26 DIAGNOSIS — M4802 Spinal stenosis, cervical region: Secondary | ICD-10-CM | POA: Diagnosis not present

## 2022-10-26 DIAGNOSIS — M5412 Radiculopathy, cervical region: Secondary | ICD-10-CM | POA: Diagnosis not present

## 2022-11-03 DIAGNOSIS — M25512 Pain in left shoulder: Secondary | ICD-10-CM | POA: Diagnosis not present

## 2022-11-03 DIAGNOSIS — M5412 Radiculopathy, cervical region: Secondary | ICD-10-CM | POA: Diagnosis not present

## 2022-11-20 DIAGNOSIS — M25512 Pain in left shoulder: Secondary | ICD-10-CM | POA: Diagnosis not present

## 2022-11-20 DIAGNOSIS — M5412 Radiculopathy, cervical region: Secondary | ICD-10-CM | POA: Diagnosis not present

## 2022-11-26 DIAGNOSIS — S46012D Strain of muscle(s) and tendon(s) of the rotator cuff of left shoulder, subsequent encounter: Secondary | ICD-10-CM | POA: Diagnosis not present

## 2022-12-29 IMAGING — CT CT CHEST W/O CM
2 of 4 series · 15 of 36 positions shown, 18 images · non-contrast
Comparison: 05/31/2019.

CLINICAL DATA: Abnormal chest radiograph, pulmonary nodule.

EXAM:
CT CHEST WITHOUT CONTRAST
TECHNIQUE: Multidetector CT imaging of the chest was performed following the
standard protocol without IV contrast.

[Series 2: chest 2.00 · axial · 0.71mm/px · z∈[-1427,-1115]mm · 12 of 186 slices shown, 15 images]
[im 15/186  mediastinal]
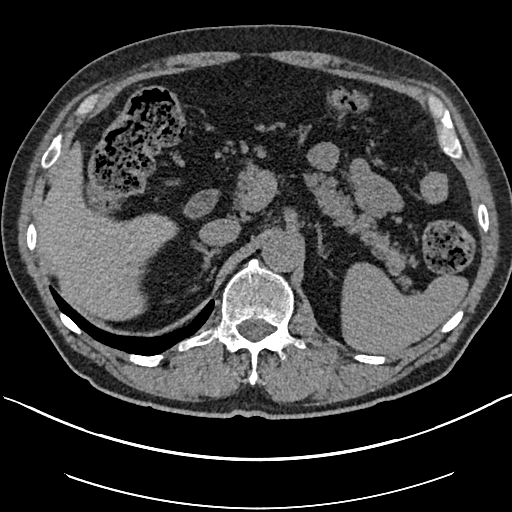
[im 15/186  lung]
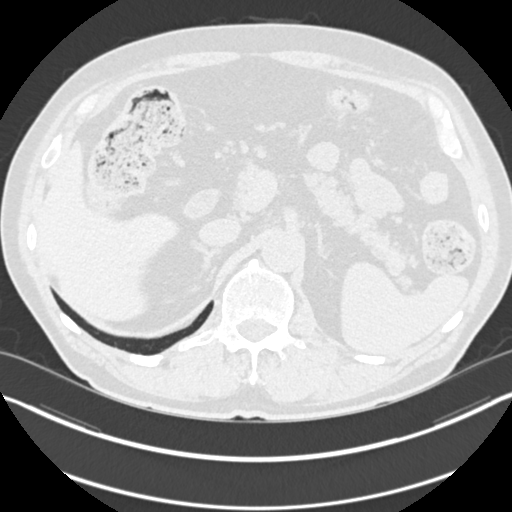
[im 29/186  lung]
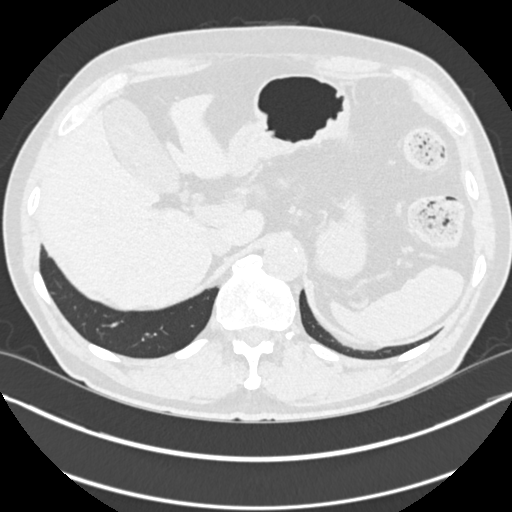
[im 43/186  lung]
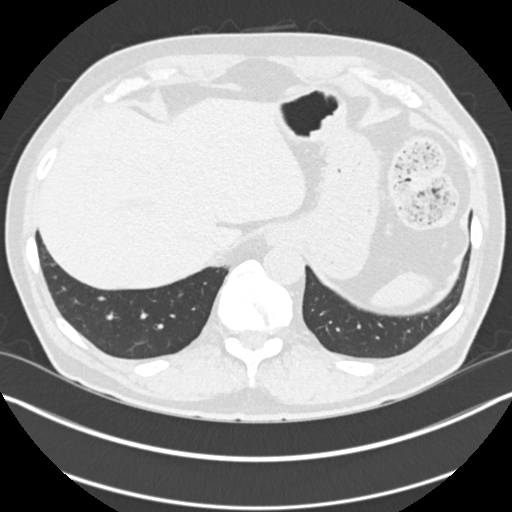
[im 57/186  lung]
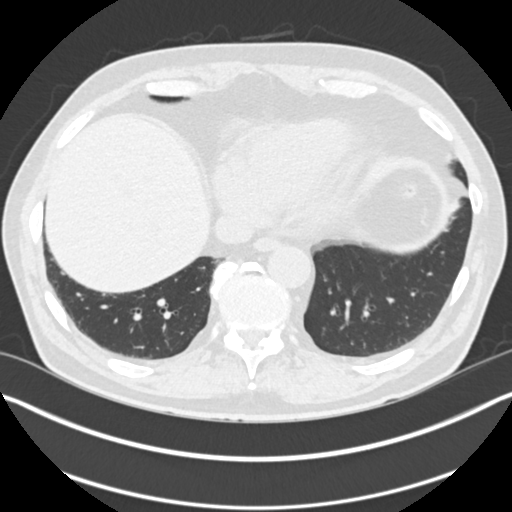
[im 72/186  mediastinal]
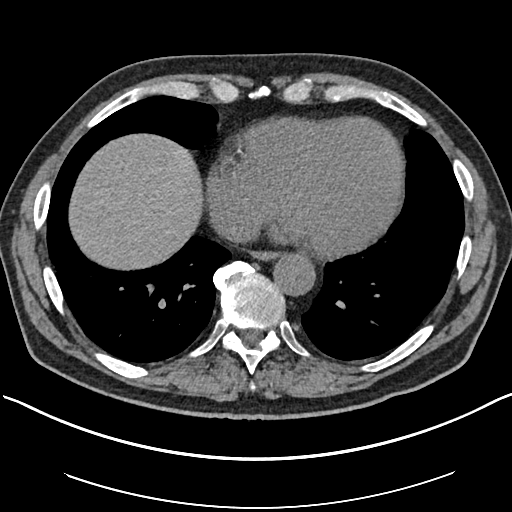
[im 72/186  lung]
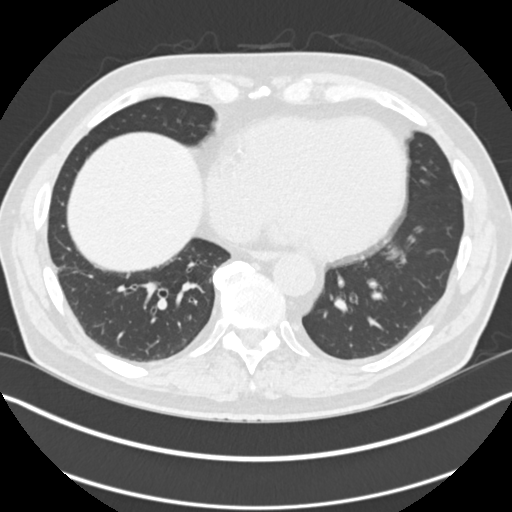
[im 86/186  lung]
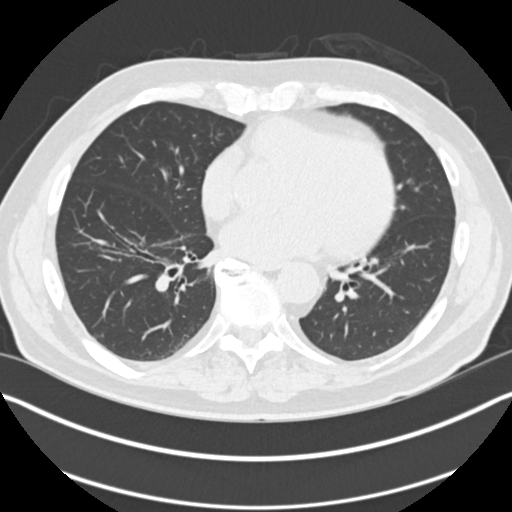
[im 100/186  lung]
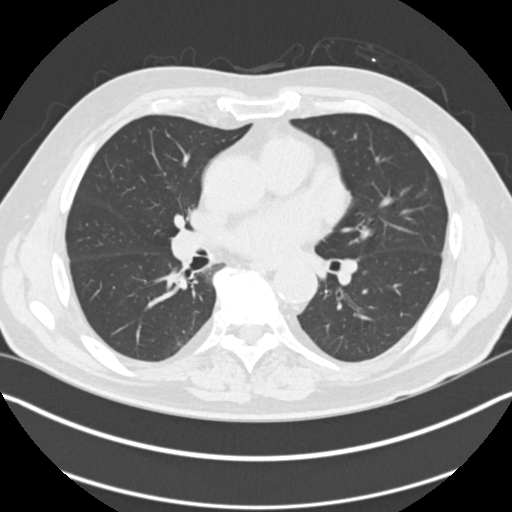
[im 114/186  lung]
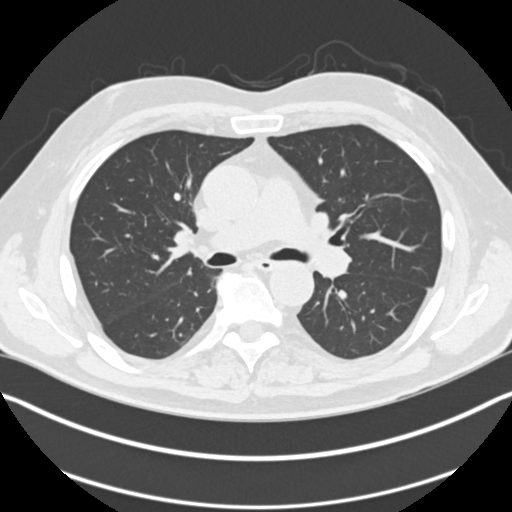
[im 129/186  mediastinal]
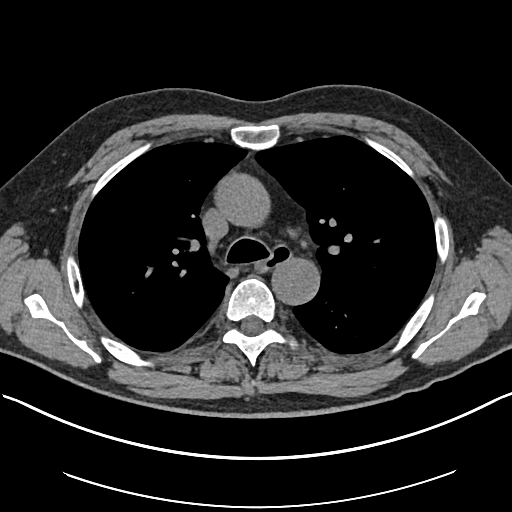
[im 129/186  lung]
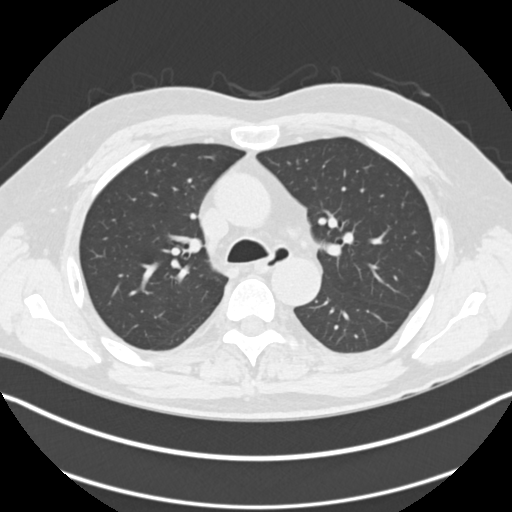
[im 143/186  lung]
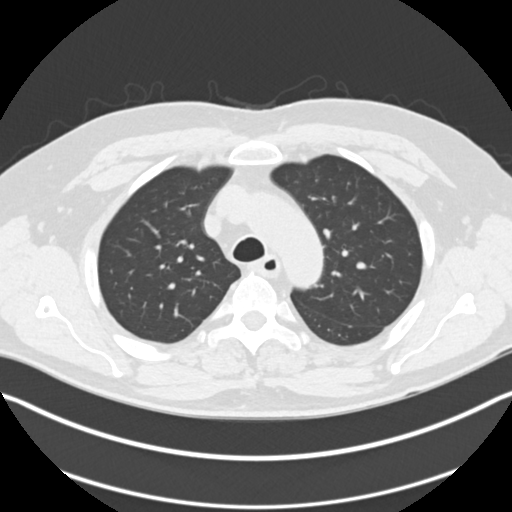
[im 157/186  lung]
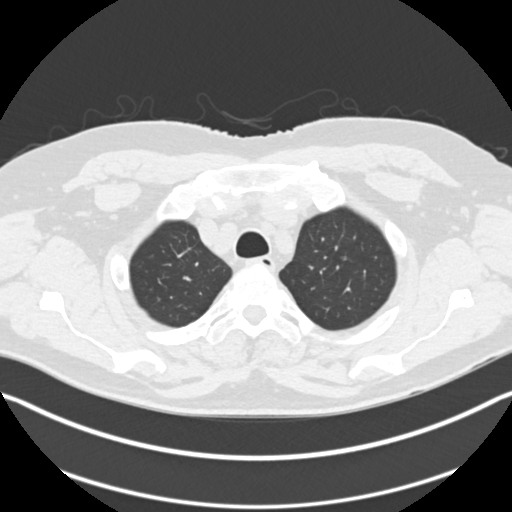
[im 171/186  lung]
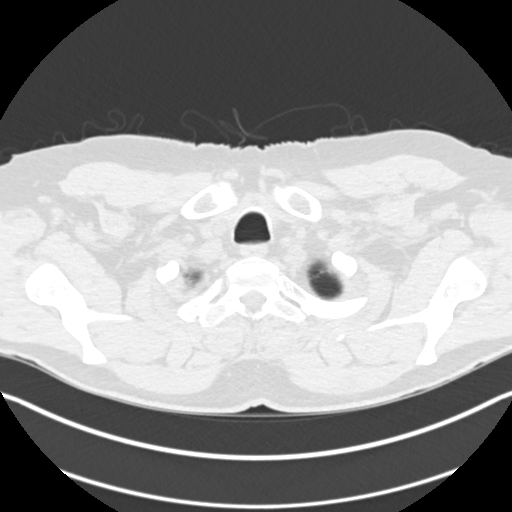

[Series 5: coronals chest 2.00 cor · coronal · 0.71mm/px · 3 of 139 slices shown]
[im 28/139  lung]
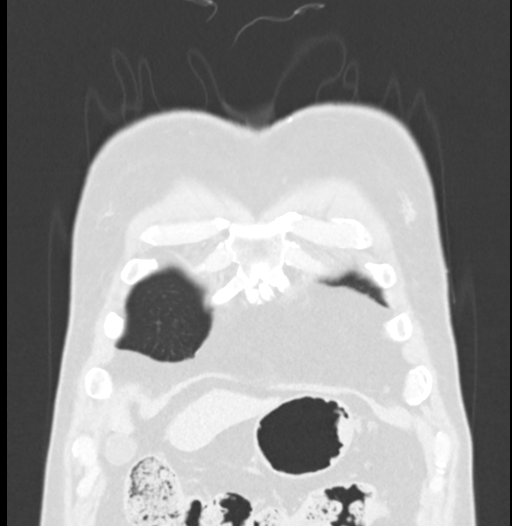
[im 56/139  lung]
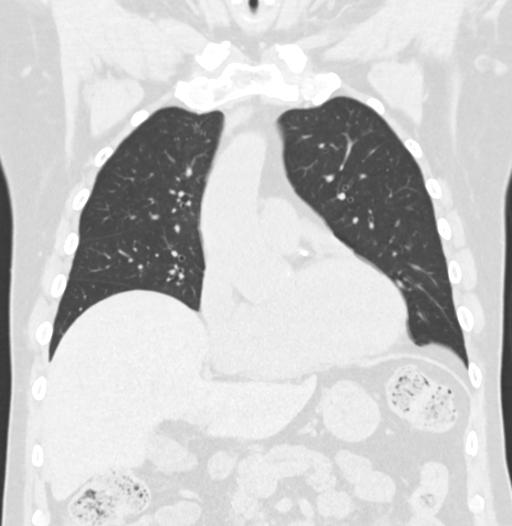
[im 83/139  lung]
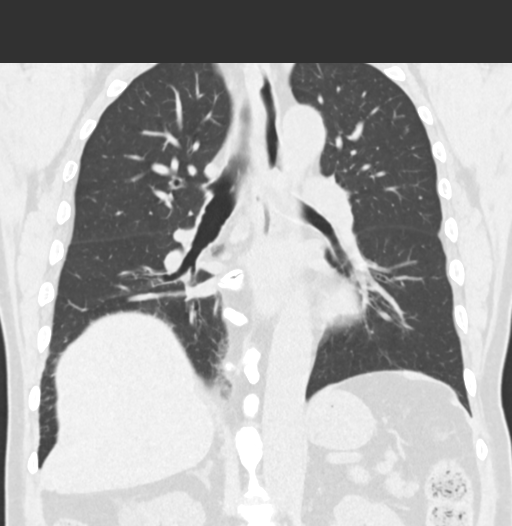

[15 of 36 positions shown; findings below may reference images not displayed]

FINDINGS: Cardiovascular: Atherosclerotic calcification of the aorta, aortic
valve and coronary arteries. Heart is at the upper limits of normal
in size. No pericardial effusion.

Mediastinum/Nodes: No pathologically enlarged mediastinal or
axillary lymph nodes. Hilar regions are difficult to evaluate
without IV contrast. Esophagus is grossly unremarkable.

Lungs/Pleura: 6 mm subpleural right lower lobe nodule (3/104),
unchanged and benign. Scarring and bronchiectasis in the right lower
lobe, possibly postinfectious in etiology. Lungs are otherwise
clear. No pleural fluid. Airway is unremarkable.

Upper Abdomen: Liver is unremarkable. Stones in the gallbladder.
Adrenal glands are unremarkable. Low-attenuation lesions in the
right kidney measure up to 1.8 cm and are difficult to further
characterize due to incomplete visualization or size. Visualized
portions of the left kidney, spleen, pancreas, stomach and bowel are
otherwise unremarkable.

Musculoskeletal: Degenerative changes in the spine. No worrisome
lytic or sclerotic lesions.
IMPRESSION: 1. 6 mm subpleural right lower lobe nodule is unchanged and
considered benign.
2. Cholelithiasis.
3. Aortic atherosclerosis ([id number]). Coronary artery
calcification.

## 2023-01-05 DIAGNOSIS — G4733 Obstructive sleep apnea (adult) (pediatric): Secondary | ICD-10-CM | POA: Diagnosis not present

## 2023-01-05 DIAGNOSIS — I10 Essential (primary) hypertension: Secondary | ICD-10-CM | POA: Diagnosis not present

## 2023-01-05 DIAGNOSIS — M25512 Pain in left shoulder: Secondary | ICD-10-CM | POA: Diagnosis not present

## 2023-01-05 DIAGNOSIS — G8929 Other chronic pain: Secondary | ICD-10-CM | POA: Diagnosis not present

## 2023-01-05 DIAGNOSIS — M5412 Radiculopathy, cervical region: Secondary | ICD-10-CM | POA: Diagnosis not present

## 2023-01-05 DIAGNOSIS — F3341 Major depressive disorder, recurrent, in partial remission: Secondary | ICD-10-CM | POA: Diagnosis not present

## 2023-01-05 DIAGNOSIS — I420 Dilated cardiomyopathy: Secondary | ICD-10-CM | POA: Diagnosis not present

## 2023-01-05 DIAGNOSIS — G309 Alzheimer's disease, unspecified: Secondary | ICD-10-CM | POA: Diagnosis not present

## 2023-01-05 DIAGNOSIS — F0283 Dementia in other diseases classified elsewhere, unspecified severity, with mood disturbance: Secondary | ICD-10-CM | POA: Diagnosis not present

## 2023-01-05 DIAGNOSIS — I4819 Other persistent atrial fibrillation: Secondary | ICD-10-CM | POA: Diagnosis not present

## 2023-01-26 ENCOUNTER — Other Ambulatory Visit: Payer: Self-pay | Admitting: Physician Assistant

## 2023-01-26 DIAGNOSIS — I10 Essential (primary) hypertension: Secondary | ICD-10-CM | POA: Diagnosis not present

## 2023-01-26 DIAGNOSIS — I77810 Thoracic aortic ectasia: Secondary | ICD-10-CM | POA: Diagnosis not present

## 2023-01-26 DIAGNOSIS — I720 Aneurysm of carotid artery: Secondary | ICD-10-CM | POA: Diagnosis not present

## 2023-01-26 DIAGNOSIS — I420 Dilated cardiomyopathy: Secondary | ICD-10-CM | POA: Diagnosis not present

## 2023-01-26 DIAGNOSIS — I4891 Unspecified atrial fibrillation: Secondary | ICD-10-CM | POA: Diagnosis not present

## 2023-02-11 DIAGNOSIS — M48061 Spinal stenosis, lumbar region without neurogenic claudication: Secondary | ICD-10-CM | POA: Diagnosis not present

## 2023-02-16 ENCOUNTER — Ambulatory Visit
Admission: RE | Admit: 2023-02-16 | Discharge: 2023-02-16 | Disposition: A | Discharge status: Home / Self Care | Payer: Medicare Other | Source: Ambulatory Visit | Attending: Physician Assistant | Admitting: Physician Assistant

## 2023-02-16 DIAGNOSIS — I720 Aneurysm of carotid artery: Secondary | ICD-10-CM | POA: Insufficient documentation

## 2023-02-16 DIAGNOSIS — R42 Dizziness and giddiness: Secondary | ICD-10-CM | POA: Diagnosis not present

## 2023-02-16 DIAGNOSIS — I700 Atherosclerosis of aorta: Secondary | ICD-10-CM | POA: Diagnosis not present

## 2023-02-16 DIAGNOSIS — R2681 Unsteadiness on feet: Secondary | ICD-10-CM | POA: Diagnosis not present

## 2023-02-16 DIAGNOSIS — I6522 Occlusion and stenosis of left carotid artery: Secondary | ICD-10-CM | POA: Diagnosis not present

## 2023-02-16 MED ORDER — IOHEXOL 350 MG/ML SOLN
75.0000 mL | Freq: Once | INTRAVENOUS | Status: AC | PRN
  Administered 2023-02-16: 75 mL via INTRAVENOUS

## 2023-03-02 DIAGNOSIS — M48061 Spinal stenosis, lumbar region without neurogenic claudication: Secondary | ICD-10-CM | POA: Diagnosis not present

## 2023-04-29 ENCOUNTER — Other Ambulatory Visit
Admission: RE | Admit: 2023-04-29 | Discharge: 2023-04-29 | Disposition: A | Discharge status: Home / Self Care | Payer: Medicare Other | Source: Ambulatory Visit | Attending: Physician Assistant | Admitting: Physician Assistant

## 2023-04-29 DIAGNOSIS — R0602 Shortness of breath: Secondary | ICD-10-CM | POA: Diagnosis present

## 2023-04-29 LAB — BRAIN NATRIURETIC PEPTIDE: B Natriuretic Peptide: 164.9 pg/mL — ABNORMAL HIGH (ref 0.0–100.0)

## 2023-05-06 ENCOUNTER — Emergency Department (HOSPITAL_COMMUNITY): Payer: Medicare Other

## 2023-05-06 ENCOUNTER — Emergency Department (HOSPITAL_COMMUNITY)
Admission: EM | Admit: 2023-05-06 | Discharge: 2023-05-06 | Disposition: A | Discharge status: Home / Self Care | Payer: Medicare Other | Attending: Emergency Medicine | Admitting: Emergency Medicine

## 2023-05-06 ENCOUNTER — Encounter (HOSPITAL_COMMUNITY): Payer: Self-pay

## 2023-05-06 ENCOUNTER — Other Ambulatory Visit: Payer: Self-pay

## 2023-05-06 DIAGNOSIS — S0990XA Unspecified injury of head, initial encounter: Secondary | ICD-10-CM

## 2023-05-06 DIAGNOSIS — M25531 Pain in right wrist: Secondary | ICD-10-CM | POA: Diagnosis present

## 2023-05-06 DIAGNOSIS — W01198A Fall on same level from slipping, tripping and stumbling with subsequent striking against other object, initial encounter: Secondary | ICD-10-CM | POA: Diagnosis not present

## 2023-05-06 DIAGNOSIS — I6782 Cerebral ischemia: Secondary | ICD-10-CM | POA: Diagnosis not present

## 2023-05-06 DIAGNOSIS — Y92480 Sidewalk as the place of occurrence of the external cause: Secondary | ICD-10-CM | POA: Insufficient documentation

## 2023-05-06 DIAGNOSIS — J324 Chronic pansinusitis: Secondary | ICD-10-CM | POA: Diagnosis not present

## 2023-05-06 DIAGNOSIS — Y9301 Activity, walking, marching and hiking: Secondary | ICD-10-CM | POA: Insufficient documentation

## 2023-05-06 DIAGNOSIS — S63501A Unspecified sprain of right wrist, initial encounter: Secondary | ICD-10-CM | POA: Insufficient documentation

## 2023-05-06 DIAGNOSIS — Z7901 Long term (current) use of anticoagulants: Secondary | ICD-10-CM | POA: Diagnosis not present

## 2023-05-06 DIAGNOSIS — W19XXXA Unspecified fall, initial encounter: Secondary | ICD-10-CM

## 2023-05-06 DIAGNOSIS — S0081XA Abrasion of other part of head, initial encounter: Secondary | ICD-10-CM | POA: Diagnosis not present

## 2023-05-06 LAB — CBC WITH DIFFERENTIAL/PLATELET
Abs Immature Granulocytes: 0.02 10*3/uL (ref 0.00–0.07)
Basophils Absolute: 0.1 10*3/uL (ref 0.0–0.1)
Basophils Relative: 2 %
Eosinophils Absolute: 0.5 10*3/uL (ref 0.0–0.5)
Eosinophils Relative: 7 %
HCT: 40.3 % (ref 39.0–52.0)
Hemoglobin: 13.2 g/dL (ref 13.0–17.0)
Immature Granulocytes: 0 %
Lymphocytes Relative: 32 %
Lymphs Abs: 2.3 10*3/uL (ref 0.7–4.0)
MCH: 27.7 pg (ref 26.0–34.0)
MCHC: 32.8 g/dL (ref 30.0–36.0)
MCV: 84.5 fL (ref 80.0–100.0)
Monocytes Absolute: 0.8 10*3/uL (ref 0.1–1.0)
Monocytes Relative: 11 %
Neutro Abs: 3.5 10*3/uL (ref 1.7–7.7)
Neutrophils Relative %: 48 %
Platelets: 354 10*3/uL (ref 150–400)
RBC: 4.77 MIL/uL (ref 4.22–5.81)
RDW: 14.6 % (ref 11.5–15.5)
WBC: 7.3 10*3/uL (ref 4.0–10.5)
nRBC: 0 % (ref 0.0–0.2)

## 2023-05-06 LAB — BASIC METABOLIC PANEL
Anion gap: 12 (ref 5–15)
BUN: 13 mg/dL (ref 8–23)
CO2: 22 mmol/L (ref 22–32)
Calcium: 8.6 mg/dL — ABNORMAL LOW (ref 8.9–10.3)
Chloride: 104 mmol/L (ref 98–111)
Creatinine, Ser: 1.39 mg/dL — ABNORMAL HIGH (ref 0.61–1.24)
GFR, Estimated: 53 mL/min — ABNORMAL LOW (ref >60)
Glucose, Bld: 117 mg/dL — ABNORMAL HIGH (ref 70–99)
Potassium: 4.2 mmol/L (ref 3.5–5.1)
Sodium: 138 mmol/L (ref 135–145)

## 2023-05-06 NOTE — ED Provider Notes (Signed)
[place] EMERGENCY DEPARTMENT AT [facility] Provider Note   CSN: [id number] Arrival date & time: 05/06/23  1340     History  Chief Complaint  Patient presents with   Jeffrey French    NERI SAMEK is a 73 y.o. male.  Patient here following mechanical fall.  Drove here after he tripped and fell landed on his right wrist but also the right side of his head.  He is on blood thinner.  Eliquis.  He has a history of A-fib.  Nothing makes it worse or better.  Did not lose consciousness.  He tripped over his feet.  He denies any neck pain other extremity pain including back pain abdominal pain.  Denies any fever or chills.  The history is provided by the patient.       Home Medications Prior to Admission medications   Medication Sig Start Date End Date Taking? Authorizing Provider  alprazolam (XANAX) 2 MG tablet TAKE 1/2 TABLET BY MOUTH UP TO TWICE A DAY AS NEEDED. MAXIMUM 1 TABLET PER DAY. 08/11/21   Bosie Clos, MD  amLODipine (NORVASC) 5 MG tablet Take 1 tablet (5 mg total) by mouth daily. 01/02/21   Bosie Clos, MD  apixaban (ELIQUIS) 5 MG TABS tablet Take 1 tablet (5 mg total) by mouth 2 (two) times daily. 01/02/21   Bosie Clos, MD  azelastine (ASTELIN) 0.1 % nasal spray Place 1 spray into both nostrils 2 (two) times daily. Use in each nostril as directed 04/10/22   Drubel, Lillia Abed, PA-C  buPROPion (WELLBUTRIN XL) 300 MG 24 hr tablet Take 1 tablet (300 mg total) by mouth daily. 03/05/22   Alfredia Ferguson, PA-C  Cholecalciferol (VITAMIN D) 2000 UNITS CAPS Take 1 capsule by mouth daily.  02/10/11   [provider]  finasteride (PROSCAR) 5 MG tablet Take 1 tablet (5 mg total) by mouth daily. 03/03/22   Caro Laroche, DO  folic acid (FOLVITE) 800 MCG tablet Take 1 tablet (800 mcg total) by mouth daily. 12/27/19   Bosie Clos, MD  furosemide (LASIX) 20 MG tablet TAKE 1 TABLET (20 MG TOTAL) BY MOUTH ONCE DAILY. TAKE AN ADDITIONAL TABLET ASNEEDED FOR  WEIGHT GAIN GREATER THAN 2 POUNDS A NIGHT. 04/04/18   [provider]  lisinopril (ZESTRIL) 10 MG tablet Take 1 tablet (10 mg total) by mouth daily. 08/11/21   Bosie Clos, MD  Melatonin 1 MG TABS Take 2 mg by mouth at bedtime.     [provider]  metoprolol succinate (TOPROL-XL) 50 MG 24 hr tablet TAKE 1 TABLET BY MOUTH DAILY WITH OR IMMEDIATELY FOLLOWING A MEAL 12/24/21   Bosie Clos, MD  mupirocin ointment (BACTROBAN) 2 % Apply 1 Application topically 3 (three) times daily. 03/02/22   Crain, Whitney L, PA  omeprazole (PRILOSEC) 20 MG capsule Take 1 capsule (20 mg total) by mouth daily. 04/29/22   Alfredia Ferguson, PA-C  tamsulosin (FLOMAX) 0.4 MG CAPS capsule Take 1 capsule (0.4 mg total) by mouth daily. 03/24/22   Caro Laroche, DO  venlafaxine XR (EFFEXOR-XR) 75 MG 24 hr capsule Take 3 capsules (225 mg total) by mouth every morning. 03/05/22   Alfredia Ferguson, PA-C  spironolactone (ALDACTONE) 25 MG tablet Take by mouth. 03/17/18 05/31/19  [provider]      Allergies    Sulfamethoxazole-trimethoprim    Review of Systems   Review of Systems  Physical Exam Updated Vital Signs BP 133/69   Pulse 71  Temp 98.5 F (36.9 C) (Oral)   Resp 20   Ht 6\' 3"  (1.905 m)   Wt 101.6 kg   SpO2 95%   BMI 28.00 kg/m  Physical Exam Vitals and nursing note reviewed.  Constitutional:      General: He is not in acute distress.    Appearance: He is well-developed.  HENT:     Head:     Comments: Abrasion over the right side of his forehead    Nose: Nose normal.     Mouth/Throat:     Mouth: Mucous membranes are moist.  Eyes:     Extraocular Movements: Extraocular movements intact.     Conjunctiva/sclera: Conjunctivae normal.     Pupils: Pupils are equal, round, and reactive to light.  Cardiovascular:     Rate and Rhythm: Normal rate and regular rhythm.     Heart sounds: No murmur heard. Pulmonary:     Effort: Pulmonary effort is normal. No  respiratory distress.     Breath sounds: Normal breath sounds.  Abdominal:     Palpations: Abdomen is soft.     Tenderness: There is no abdominal tenderness.  Musculoskeletal:        General: Tenderness present. No swelling. Normal range of motion.     Cervical back: Neck supple.     Comments: Tenderness to the right wrist  Skin:    General: Skin is warm and dry.     Capillary Refill: Capillary refill takes less than 2 seconds.  Neurological:     General: No focal deficit present.     Mental Status: He is alert and oriented to person, place, and time.     Cranial Nerves: No cranial nerve deficit.     Sensory: No sensory deficit.     Motor: No weakness.     Coordination: Coordination normal.  Psychiatric:        Mood and Affect: Mood normal.     ED Results / Procedures / Treatments   Labs (all labs ordered are listed, but only abnormal results are displayed) Labs Reviewed  BASIC METABOLIC PANEL - Abnormal; Notable for the following components:      Result Value   Glucose, Bld 117 (*)    Creatinine, Ser 1.39 (*)    Calcium 8.6 (*)    GFR, Estimated 53 (*)    All other components within normal limits  CBC WITH DIFFERENTIAL/PLATELET    EKG None  Radiology CT Head Wo Contrast Result Date: 05/06/2023 CLINICAL DATA:  Polytrauma, blunt EXAM: CT HEAD WITHOUT CONTRAST TECHNIQUE: Contiguous axial images were obtained from the base of the skull through the vertex without intravenous contrast. RADIATION DOSE REDUCTION: This exam was performed according to the departmental dose-optimization program which includes automated exposure control, adjustment of the mA and/or kV according to patient size and/or use of iterative reconstruction technique. COMPARISON:  03/11/2011 FINDINGS: Brain: No evidence of acute infarction, hemorrhage, hydrocephalus, extra-axial collection or mass lesion/mass effect. Scattered low-density changes within the periventricular and subcortical white matter most  compatible with chronic microvascular ischemic change. Mild diffuse cerebral volume loss. Vascular: No hyperdense vessel or unexpected calcification. Skull: Normal. Negative for fracture or focal lesion. Sinuses/Orbits: Near complete opacification of the paranasal sinuses bilaterally with thickening and sclerosis of the sinus wall suggesting chronic sinusitis. Other: Negative for scalp hematoma. IMPRESSION: 1. No acute intracranial findings. 2. Chronic microvascular ischemic change and cerebral volume loss. 3. Chronic pansinusitis. Electronically Signed   By: Duanne Guess D.O.   On:  05/06/2023 15:39   DG Chest Portable 1 View Result Date: 05/06/2023 CLINICAL DATA:  Trip and fall. EXAM: PORTABLE CHEST 1 VIEW COMPARISON:  Radiograph 09/05/2019 FINDINGS: Lung volumes are low. The heart is normal for technique. Chronic elevation of right hemidiaphragm. Bibasilar atelectasis without confluent airspace disease. No pneumothorax or pleural effusion. Right arm appears to be raised during imaging. No displaced rib fracture. IMPRESSION: 1. Low lung volumes with bibasilar atelectasis. 2. Chronic elevation of right hemidiaphragm. Electronically Signed   By: Narda Rutherford M.D.   On: 05/06/2023 14:56   DG Wrist Complete Right Result Date: 05/06/2023 CLINICAL DATA:  Trip and fall, pain. EXAM: RIGHT WRIST - COMPLETE 3+ VIEW; RIGHT HAND - COMPLETE 3+ VIEW COMPARISON:  10/12/2020 FINDINGS: Hand: No acute fracture or dislocation of the hand. Deformity of the third digit distal tuft has a chronic appearance but is new from 2022. Degenerative change of the thumb carpal metacarpal and third metacarpal phalangeal joint. There is a chronic thin linear foreign body in the thumb adjacent to the distal phalanx. Wrist: No acute fracture or dislocation. Mild radiocarpal degenerative joint space narrowing. No erosive change. IMPRESSION: No acute fracture or dislocation of the right hand or wrist. Electronically Signed   By: Narda Rutherford M.D.   On: 05/06/2023 14:55   DG Hand Complete Right Result Date: 05/06/2023 CLINICAL DATA:  Trip and fall, pain. EXAM: RIGHT WRIST - COMPLETE 3+ VIEW; RIGHT HAND - COMPLETE 3+ VIEW COMPARISON:  10/12/2020 FINDINGS: Hand: No acute fracture or dislocation of the hand. Deformity of the third digit distal tuft has a chronic appearance but is new from 2022. Degenerative change of the thumb carpal metacarpal and third metacarpal phalangeal joint. There is a chronic thin linear foreign body in the thumb adjacent to the distal phalanx. Wrist: No acute fracture or dislocation. Mild radiocarpal degenerative joint space narrowing. No erosive change. IMPRESSION: No acute fracture or dislocation of the right hand or wrist. Electronically Signed   By: Narda Rutherford M.D.   On: 05/06/2023 14:55   DG Pelvis Portable Result Date: 05/06/2023 CLINICAL DATA:  Trip on sidewalk leading to fall. EXAM: PORTABLE PELVIS 1-2 VIEWS COMPARISON:  None Available. FINDINGS: The cortical margins of the bony pelvis are intact. No fracture. Pubic symphysis and sacroiliac joints are congruent. Both femoral heads are well-seated in the respective acetabula. Mild bilateral hip degenerative change. IMPRESSION: No pelvic fracture. Electronically Signed   By: Narda Rutherford M.D.   On: 05/06/2023 14:53    Procedures Procedures    Medications Ordered in ED Medications - No data to display  ED Course/ Medical Decision Making/ A&P                                 Medical Decision Making Amount and/or Complexity of Data Reviewed Labs: ordered. Radiology: ordered.   GALAN GHEE is here after mechanical fall.  Level 2 trauma as patient is on Eliquis for A-fib.  He is having pain in his right wrist abrasion to his right head.  He did not lose consciousness.  He denies any pain elsewhere.  No neck pain.  Will get CT scan of the head and neck and get x-rays of the wrist and hand on the right.  Bedside chest x-ray and pelvic  x-ray unremarkable.  I reviewed interpreted x-rays.  X-ray of the right wrist does not show any fracture or dislocation.  I do suspect contusion  or sprain.  Will put him in a removable wrist splint.  Recommend Tylenol ice and rest.  Head and neck CT per radiology report unremarkable.  Patient feeling well.  He states his tetanus shot is up-to-date.  Discharged him to home with PCP follow-up.  Blood work was unremarkable per my review interpretation.  This chart was dictated using voice recognition software.  Despite best efforts to proofread,  errors can occur which can change the documentation meaning.         Final Clinical Impression(s) / ED Diagnoses Final diagnoses:  Fall, initial encounter  Injury of head, initial encounter  Sprain of right wrist, initial encounter    Rx / DC Orders ED Discharge Orders     None         Virgina Norfolk, DO 05/06/23 1548

## 2023-05-06 NOTE — ED Triage Notes (Signed)
Pt tripped while walking on sidewalk. Pt hit his head on the concrete. Pt has abrasion on forehead. Pt takes eliquis. No LOC

## 2023-05-06 NOTE — Discharge Instructions (Addendum)
Recommend Tylenol for any discomfort and ice.  Use splint for comfort for your wrist.  I do think you might have sprained your wrist.  Follow-up with your primary care doctor if pain is persistent.  Head CT was unremarkable today.  No head injury.

## 2023-05-06 NOTE — Progress Notes (Signed)
Orthopedic Tech Progress Note Patient Details:  Jeffrey French September 07, 1950 [id number] Level 2 Trauma. Not needed Patient ID: CORDARRYL MONRREAL, male   DOB: Nov 20, 1950, 73 y.o.   MRN: [id number]  Lovett Calender 05/06/2023, 2:38 PM

## 2023-05-06 NOTE — Progress Notes (Signed)
While taking walk pt.fell hit head.Lunette Stands provided emotional and Spiritual support, Avaialable as needed.  Venida Jarvis, [place], [facility], Pager [phone]

## 2023-05-07 ENCOUNTER — Telehealth: Payer: Self-pay

## 2023-05-07 NOTE — Transitions of Care (Post Inpatient/ED Visit) (Unsigned)
   05/07/2023  Name: Jeffrey French MRN: [id number] DOB: 07-Aug-1950  Today's TOC FU Call Status: Today's TOC FU Call Status:: Unsuccessful Call (1st Attempt) Unsuccessful Call (1st Attempt) Date: 05/07/23  Attempted to reach the patient regarding the most recent Inpatient/ED visit.  Follow Up Plan: Additional outreach attempts will be made to reach the patient to complete the Transitions of Care (Post Inpatient/ED visit) call.   Signature Karena Addison, LPN [facility] Nurse Health Advisor Direct Dial [phone]

## 2023-05-11 NOTE — Transitions of Care (Post Inpatient/ED Visit) (Signed)
   05/11/2023  Name: Jeffrey French MRN: [id number] DOB: April 22, 1950 Patient transferred to [facility] Today's TOC FU Call Status: Today's TOC FU Call Status:: Successful TOC FU Call Completed Unsuccessful Call (1st Attempt) Date: 05/07/23 [facility] FU Call Complete Date: 05/11/23  Attempted to reach the patient regarding the most recent Inpatient/ED visit.  Follow Up Plan: No further outreach attempts will be made at this time. We have been unable to contact the patient.  Signature Julian Lemmings, LPN [facility] Nurse Health Advisor Direct Dial [phone]

## 2023-08-20 ENCOUNTER — Encounter: Payer: Self-pay | Admitting: Emergency Medicine

## 2023-08-20 ENCOUNTER — Ambulatory Visit (INDEPENDENT_AMBULATORY_CARE_PROVIDER_SITE_OTHER)

## 2023-08-20 ENCOUNTER — Ambulatory Visit: Payer: Self-pay | Admitting: Family Medicine

## 2023-08-20 ENCOUNTER — Ambulatory Visit
Admission: EM | Admit: 2023-08-20 | Discharge: 2023-08-20 | Disposition: A | Discharge status: Home / Self Care | Attending: Family Medicine | Admitting: Family Medicine

## 2023-08-20 DIAGNOSIS — J069 Acute upper respiratory infection, unspecified: Secondary | ICD-10-CM

## 2023-08-20 DIAGNOSIS — J209 Acute bronchitis, unspecified: Secondary | ICD-10-CM | POA: Diagnosis not present

## 2023-08-20 MED ORDER — HYDROCOD POLI-CHLORPHE POLI ER 10-8 MG/5ML PO SUER: 5.0000 mL | Freq: Two times a day (BID) | ORAL | 0 refills | Status: DC | PRN

## 2023-08-20 MED ORDER — AZITHROMYCIN 250 MG PO TABS: ORAL_TABLET | ORAL | 0 refills | Status: DC

## 2023-08-20 MED ORDER — BENZONATATE 100 MG PO CAPS: 100.0000 mg | ORAL_CAPSULE | Freq: Three times a day (TID) | ORAL | 0 refills | Status: DC

## 2023-08-20 MED ORDER — PREDNISONE 10 MG (21) PO TBPK: ORAL_TABLET | Freq: Every day | ORAL | 0 refills | Status: DC

## 2023-08-20 NOTE — ED Triage Notes (Signed)
 Pt c/o nasal congestion, chest congestion, and cough. Started about 3 weeks ago. Denies fever.

## 2023-08-20 NOTE — ED Provider Notes (Signed)
 [facility]    CSN: [id number] Arrival date & time: 08/20/23  1503      History   Chief Complaint Chief Complaint  Patient presents with   Nasal Congestion   Cough    HPI Jeffrey French is a 73 y.o. male.   HPI  History obtained from the patient. Jeffrey French presents for nasal congestion, chest congestion that started 3 weeks ago. Cough is "so violent." Cough has been present for 3-4 weeks.  Has a tickle in his throat and then he feels liek he is going to cough his lungs,  Taking Deslym and Robitussin.    Denies fever, vomiting, diarrhea, body ahces, headache, dizziness, ear or facial pain.  No one else has similar sx.   No history of asthma. Denies vaping and smoking.    Past Medical History:  Diagnosis Date   A-fib [facility])    resolved with ablation   Actinic keratosis 04/16/2020   L upper abdomen - bx proven    BPH (benign prostatic hyperplasia)    GERD (gastroesophageal reflux disease)    Hypertension    Numbness of foot    Bilateral. since knee replacements   Sleep apnea    moderate.  unable to tolerate CPAP    Patient Active Problem List   Diagnosis Date Noted   Aortic atherosclerosis (HCC) 03/17/2021   OSA (obstructive sleep apnea) 05/03/2018   Acute on chronic systolic CHF (congestive heart failure) (HCC) 02/28/2018   History of depression 02/26/2018   Status post ablation of atrial fibrillation 02/17/2018   Atrial fibrillation (HCC) 01/05/2018   Heart palpitations 10/21/2017   Diffuse cerebral atrophy (HCC) 03/20/2017   New onset atrial flutter (HCC) 09/18/2015   Irregular heart rhythm 09/17/2015   Absolute anemia 02/18/2015   Benign fibroma of prostate 02/18/2015   Clinical depression 02/18/2015   Essential (primary) hypertension 02/18/2015   Anxiety, generalized 02/18/2015   Insomnia 02/18/2015   Arthritis, degenerative 02/18/2015   Peptic ulcer 02/18/2015    Past Surgical History:  Procedure Laterality Date   ATRIAL FIBRILLATION  ABLATION  02/26/2018   [facility]   CATARACT EXTRACTION W/PHACO Left 11/08/2018   Procedure: CATARACT EXTRACTION PHACO AND INTRAOCULAR LENS PLACEMENT (IOC) LEFT;  Surgeon: Clair Crews, MD;  Location: [facility];  Service: Ophthalmology;  Laterality: Left;  sleep apnea not a Toric per Cindi   CATARACT EXTRACTION W/PHACO Right 11/29/2018   Procedure: CATARACT EXTRACTION PHACO AND INTRAOCULAR LENS PLACEMENT (IOC) RIGHT  00:50.9  17.4%  8.88;  Surgeon: Clair Crews, MD;  Location: [facility];  Service: Ophthalmology;  Laterality: Right;   ELECTROPHYSIOLOGIC STUDY N/A 10/09/2015   Procedure: Cardioversion;  Surgeon: Percival Brace, MD;  Location: [facility];  Service: Cardiovascular;  Laterality: N/A;   NASAL RECONSTRUCTION WITH SEPTAL REPAIR     REPLACEMENT TOTAL KNEE Right    SHOULDER SURGERY Left    TONSILLECTOMY         Home Medications    Prior to Admission medications   Medication Sig Start Date End Date Taking? Authorizing Provider  azithromycin  (ZITHROMAX  Z-PAK) 250 MG tablet Take 2 tablets on day 1 then 1 tablet daily 08/20/23  Yes Otillia Cordone, DO  benzonatate  (TESSALON ) 100 MG capsule Take 1 capsule (100 mg total) by mouth every 8 (eight) hours. 08/20/23  Yes Camil Hausmann, DO  chlorpheniramine-HYDROcodone  (TUSSIONEX) 10-8 MG/5ML Take 5 mLs by mouth every 12 (twelve) hours as needed. 08/20/23  Yes Shelli Portilla, DO  DULoxetine (CYMBALTA) 30 MG capsule Take 90  mg by mouth. 06/15/23 06/14/24 Yes [provider]  gabapentin (NEURONTIN) 300 MG capsule Take 1 capsule by mouth 3 (three) times daily. 07/15/23  Yes [provider]  mirtazapine (REMERON) 15 MG tablet Take 15 mg by mouth. 07/13/23 04/08/24 Yes [provider]  predniSONE  (STERAPRED UNI-PAK 21 TAB) 10 MG (21) TBPK tablet Take by mouth daily. Take 6 tabs by mouth daily for 1, then 5 tabs for 1 day, then 4 tabs for 1 day, then 3 tabs for 1 day, then 2 tabs for 1 day, then 1 tab for 1  day. 08/20/23  Yes Clytie Shetley, DO  alprazolam  (XANAX ) 2 MG tablet TAKE 1/2 TABLET BY MOUTH UP TO TWICE A DAY AS NEEDED. MAXIMUM 1 TABLET PER DAY. 08/11/21   Nikki Barters, MD  amLODipine  (NORVASC ) 5 MG tablet Take 1 tablet (5 mg total) by mouth daily. 01/02/21   Nikki Barters, MD  apixaban  (ELIQUIS ) 5 MG TABS tablet Take 1 tablet (5 mg total) by mouth 2 (two) times daily. 01/02/21   Nikki Barters, MD  azelastine  (ASTELIN ) 0.1 % nasal spray Place 1 spray into both nostrils 2 (two) times daily. Use in each nostril as directed 04/10/22   Drubel, Heidi Llamas, PA-C  buPROPion  (WELLBUTRIN  XL) 300 MG 24 hr tablet Take 1 tablet (300 mg total) by mouth daily. 03/05/22   Trenton Frock, PA-C  Cholecalciferol (VITAMIN D) 2000 UNITS CAPS Take 1 capsule by mouth daily.  02/10/11   [provider]  finasteride  (PROSCAR ) 5 MG tablet Take 1 tablet (5 mg total) by mouth daily. 03/03/22   Rumball, Alison M, DO  folic acid  (FOLVITE ) 800 MCG tablet Take 1 tablet (800 mcg total) by mouth daily. 12/27/19   Nikki Barters, MD  furosemide  (LASIX ) 20 MG tablet TAKE 1 TABLET (20 MG TOTAL) BY MOUTH ONCE DAILY. TAKE AN ADDITIONAL TABLET ASNEEDED FOR WEIGHT GAIN GREATER THAN 2 POUNDS A NIGHT. 04/04/18   [provider]  lisinopril  (ZESTRIL ) 10 MG tablet Take 1 tablet (10 mg total) by mouth daily. 08/11/21   Nikki Barters, MD  Melatonin 1 MG TABS Take 2 mg by mouth at bedtime.     [provider]  metoprolol  succinate (TOPROL -XL) 50 MG 24 hr tablet TAKE 1 TABLET BY MOUTH DAILY WITH OR IMMEDIATELY FOLLOWING A MEAL 12/24/21   Nikki Barters, MD  mupirocin  ointment (BACTROBAN ) 2 % Apply 1 Application topically 3 (three) times daily. 03/02/22   Crain, Whitney L, PA  omeprazole  (PRILOSEC) 20 MG capsule Take 1 capsule (20 mg total) by mouth daily. 04/29/22   Trenton Frock, PA-C  tamsulosin  (FLOMAX ) 0.4 MG CAPS capsule Take 1 capsule (0.4 mg total) by mouth daily. 03/24/22   Rumball, Alison  M, DO  venlafaxine  XR (EFFEXOR -XR) 75 MG 24 hr capsule Take 3 capsules (225 mg total) by mouth every morning. 03/05/22   Trenton Frock, PA-C  spironolactone (ALDACTONE) 25 MG tablet Take by mouth. 03/17/18 05/31/19  [provider]    Family History Family History  Problem Relation Age of Onset   Arthritis Mother    Hypertension Mother    Colon cancer Mother    Other Mother        lymph node   Diabetes Father    Congestive Heart Failure Father    Arthritis Sister     Social History Social History   Tobacco Use   Smoking status: Former    Current packs/day: 0.00    Types: Cigarettes  Quit date: 22    Years since quitting: 51.4   Smokeless tobacco: Never   Tobacco comments:    1974  Vaping Use   Vaping status: Never Used  Substance Use Topics   Alcohol use: Yes    Alcohol/week: 0.0 - 2.0 standard drinks of alcohol   Drug use: No     Allergies   Sulfamethoxazole-trimethoprim   Review of Systems Review of Systems: negative unless otherwise stated in HPI.      Physical Exam Triage Vital Signs ED Triage Vitals  Encounter Vitals Group     BP 08/20/23 1534 (!) 105/91     Systolic BP Percentile --      Diastolic BP Percentile --      Pulse Rate 08/20/23 1534 85     Resp 08/20/23 1534 18     Temp 08/20/23 1534 98.4 F (36.9 C)     Temp Source 08/20/23 1534 Oral     SpO2 08/20/23 1534 97 %     Weight --      Height --      Head Circumference --      Peak Flow --      Pain Score 08/20/23 1531 4     Pain Loc --      Pain Education --      Exclude from Growth Chart --    No data found.  Updated Vital Signs BP (!) 105/91 (BP Location: Left Arm)   Pulse 85   Temp 98.4 F (36.9 C) (Oral)   Resp 18   SpO2 97%   Visual Acuity Right Eye Distance:   Left Eye Distance:   Bilateral Distance:    Right Eye Near:   Left Eye Near:    Bilateral Near:     Physical Exam GEN:     alert, non-toxic appearing male in no distress    HENT:  mucus  membranes moist, clear nasal discharge EYES:   no scleral injection or discharge RESP:  no increased work of breathing, coarse breathe sounds bilaterally CVS:   regular rate and rhythm Skin:   warm and dry    UC Treatments / Results  Labs (all labs ordered are listed, but only abnormal results are displayed) Labs Reviewed - No data to display  EKG   Radiology DG Chest 2 View Result Date: 08/20/2023 CLINICAL DATA:  Cough for months. EXAM: CHEST - 2 VIEW COMPARISON:  None Available. FINDINGS: Normal-sized heart. Clear lungs with normal vascularity. Minimal peribronchial thickening. Marked thoracic spine degenerative changes. IMPRESSION: Minimal bronchitic changes. Electronically Signed   By: Catherin Closs M.D.   On: 08/20/2023 16:37    Procedures Procedures (including critical care time)  Medications Ordered in UC Medications - No data to display  Initial Impression / Assessment and Plan / UC Course  I have reviewed the triage vital signs and the nursing notes.  Pertinent labs & imaging results that were available during my care of the patient were reviewed by me and considered in my medical decision making (see chart for details).       Pt is a 73 y.o. male who presents for 4 weeks of cough that is not improving.  Robertt is  afebrile here without recent antipyretics. Satting wellon room air. Overall pt is  non-toxic appearing, well hydrated, without respiratory distress. Pulmonary exam is remarkable for coarse breath sounds bilaterally and frequent cough.  After shared decision making, we will pursue chest x-ray.  COVID  and influenza testing deferred  due to length of symptoms.  Chest xray personally reviewed by me concerning for possible bronchopneumonia on the right. No pleural effusion, cardiomegaly or pneumothorax seen. Patient aware the radiologist has not read his xray and is comfortable with the preliminary read by me. Will review radiologist read when available and call  patient if a change in plan is warranted.  Pt agreeable to this plan prior to discharge.    Treat acute bronchitis with steroids and antibiotics as below.  Tussionex cough syrup and Tessalon  Perles given for cough and allow patient to rest.  Typical duration of symptoms discussed. Return and ED precautions given and patient voiced understanding.   Discussed MDM, treatment plan and plan for follow-up with patient who agrees with plan.    Radiologist impression reviewed.  No change in plan warranted. Final Clinical Impressions(s) / UC Diagnoses   Final diagnoses:  Acute bronchitis, unspecified organism  URI with cough and congestion     Discharge Instructions      Your chest xray did show an area concerning for pneumonia though the radiologist has not yet read it. If they agree, I will call you.    Stop by the pharmacy to pick up your prescriptions.  Follow up with your primary care provider or return to the urgent care, if not improving.     ED Prescriptions     Medication Sig Dispense Auth. Provider   chlorpheniramine-HYDROcodone  (TUSSIONEX) 10-8 MG/5ML Take 5 mLs by mouth every 12 (twelve) hours as needed. 115 mL Shanti Agresti, DO   benzonatate  (TESSALON ) 100 MG capsule Take 1 capsule (100 mg total) by mouth every 8 (eight) hours. 21 capsule Trai Ells, DO   azithromycin  (ZITHROMAX  Z-PAK) 250 MG tablet Take 2 tablets on day 1 then 1 tablet daily 6 tablet Humza Tallerico, DO   predniSONE  (STERAPRED UNI-PAK 21 TAB) 10 MG (21) TBPK tablet Take by mouth daily. Take 6 tabs by mouth daily for 1, then 5 tabs for 1 day, then 4 tabs for 1 day, then 3 tabs for 1 day, then 2 tabs for 1 day, then 1 tab for 1 day. 21 tablet Winnell Bento, DO      I have reviewed the PDMP during this encounter.   Ingrid Shifrin, DO 08/20/23 1734

## 2023-08-20 NOTE — Discharge Instructions (Signed)
 Your chest xray did show an area concerning for pneumonia though the radiologist has not yet read it. If they agree, I will call you.    Stop by the pharmacy to pick up your prescriptions.  Follow up with your primary care provider or return to the urgent care, if not improving.

## 2023-09-23 ENCOUNTER — Ambulatory Visit: Payer: Self-pay | Admitting: Family Medicine

## 2023-09-23 ENCOUNTER — Ambulatory Visit (INDEPENDENT_AMBULATORY_CARE_PROVIDER_SITE_OTHER)

## 2023-09-23 ENCOUNTER — Ambulatory Visit
Admission: EM | Admit: 2023-09-23 | Discharge: 2023-09-23 | Disposition: A | Discharge status: Home / Self Care | Attending: Family Medicine | Admitting: Family Medicine

## 2023-09-23 DIAGNOSIS — J40 Bronchitis, not specified as acute or chronic: Secondary | ICD-10-CM

## 2023-09-23 DIAGNOSIS — R051 Acute cough: Secondary | ICD-10-CM | POA: Diagnosis not present

## 2023-09-23 DIAGNOSIS — Z87891 Personal history of nicotine dependence: Secondary | ICD-10-CM | POA: Diagnosis not present

## 2023-09-23 MED ORDER — PREDNISONE 10 MG (21) PO TBPK: ORAL_TABLET | Freq: Every day | ORAL | 0 refills | Status: AC

## 2023-09-23 MED ORDER — CEFDINIR 300 MG PO CAPS: 300.0000 mg | ORAL_CAPSULE | Freq: Two times a day (BID) | ORAL | 0 refills | Status: AC

## 2023-09-23 MED ORDER — PROMETHAZINE-DM 6.25-15 MG/5ML PO SYRP: 5.0000 mL | ORAL_SOLUTION | Freq: Four times a day (QID) | ORAL | 0 refills | Status: AC | PRN

## 2023-09-23 NOTE — Discharge Instructions (Signed)
 Your chest xray did not show evidence of pneumonia but did show evidence of bronchitis.  The radiologist has not yet read your chest films yet.  If they find something that I didn't, I will call you. You will see your results in Mychart.   Stop by the pharmacy to pick up your prescriptions (cough medicine, steroids and antibiotics).  Follow up with your primary care provider or return to the urgent care, if not improving.

## 2023-09-23 NOTE — ED Provider Notes (Incomplete)
 [facility]    CSN: [id number] Arrival date & time: 09/23/23  1514      History   Chief Complaint Chief Complaint  Patient presents with   Cough    HPI Jeffrey French is a 73 y.o. male.   HPI  History obtained from the patient. Jeffrey French presents for ***.   Fever : no  Chills: no Sore throat: no   Cough: no Sputum: no Chest tightness: no Shortness of breath: no Wheezing: no  Nasal congestion : no  Rhinorrhea: no Myalgias: no Appetite: normal  Hydration: normal  Abdominal pain: no Nausea: no Vomiting: no Diarrhea: No Rash: No Sleep disturbance: no Headache: no      Past Medical History:  Diagnosis Date   A-fib (HCC)    resolved with ablation   Actinic keratosis 04/16/2020   L upper abdomen - bx proven    BPH (benign prostatic hyperplasia)    GERD (gastroesophageal reflux disease)    Hypertension    Numbness of foot    Bilateral. since knee replacements   Sleep apnea    moderate.  unable to tolerate CPAP    Patient Active Problem List   Diagnosis Date Noted   Aortic atherosclerosis (HCC) 03/17/2021   OSA (obstructive sleep apnea) 05/03/2018   Acute on chronic systolic CHF (congestive heart failure) (HCC) 02/28/2018   History of depression 02/26/2018   Status post ablation of atrial fibrillation 02/17/2018   Atrial fibrillation (HCC) 01/05/2018   Heart palpitations 10/21/2017   Diffuse cerebral atrophy (HCC) 03/20/2017   New onset atrial flutter (HCC) 09/18/2015   Irregular heart rhythm 09/17/2015   Absolute anemia 02/18/2015   Benign fibroma of prostate 02/18/2015   Clinical depression 02/18/2015   Essential (primary) hypertension 02/18/2015   Anxiety, generalized 02/18/2015   Insomnia 02/18/2015   Arthritis, degenerative 02/18/2015   Peptic ulcer 02/18/2015    Past Surgical History:  Procedure Laterality Date   ATRIAL FIBRILLATION ABLATION  02/26/2018   [facility]   CATARACT EXTRACTION W/PHACO Left 11/08/2018   Procedure:  CATARACT EXTRACTION PHACO AND INTRAOCULAR LENS PLACEMENT (IOC) LEFT;  Surgeon: Clair Crews, MD;  Location: [facility];  Service: Ophthalmology;  Laterality: Left;  sleep apnea not a Toric per Cindi   CATARACT EXTRACTION W/PHACO Right 11/29/2018   Procedure: CATARACT EXTRACTION PHACO AND INTRAOCULAR LENS PLACEMENT (IOC) RIGHT  00:50.9  17.4%  8.88;  Surgeon: Clair Crews, MD;  Location: [facility];  Service: Ophthalmology;  Laterality: Right;   ELECTROPHYSIOLOGIC STUDY N/A 10/09/2015   Procedure: Cardioversion;  Surgeon: Percival Brace, MD;  Location: [facility];  Service: Cardiovascular;  Laterality: N/A;   NASAL RECONSTRUCTION WITH SEPTAL REPAIR     REPLACEMENT TOTAL KNEE Right    SHOULDER SURGERY Left    TONSILLECTOMY         Home Medications    Prior to Admission medications   Medication Sig Start Date End Date Taking? Authorizing Provider  amLODipine  (NORVASC ) 5 MG tablet Take 1 tablet (5 mg total) by mouth daily. 01/02/21  Yes Nikki Barters, MD  apixaban  (ELIQUIS ) 5 MG TABS tablet Take 1 tablet (5 mg total) by mouth 2 (two) times daily. 01/02/21  Yes Nikki Barters, MD  buPROPion  (WELLBUTRIN  XL) 300 MG 24 hr tablet Take 1 tablet (300 mg total) by mouth daily. 03/05/22  Yes Trenton Frock, PA-C  DULoxetine (CYMBALTA) 30 MG capsule Take 90 mg by mouth. 06/15/23 06/14/24 Yes [provider]  finasteride  (PROSCAR ) 5 MG tablet Take  1 tablet (5 mg total) by mouth daily. 03/03/22  Yes Rumball, Alison M, DO  folic acid  (FOLVITE ) 800 MCG tablet Take 1 tablet (800 mcg total) by mouth daily. 12/27/19  Yes Nikki Barters, MD  furosemide  (LASIX ) 20 MG tablet TAKE 1 TABLET (20 MG TOTAL) BY MOUTH ONCE DAILY. TAKE AN ADDITIONAL TABLET ASNEEDED FOR WEIGHT GAIN GREATER THAN 2 POUNDS A NIGHT. 04/04/18  Yes [provider]  gabapentin (NEURONTIN) 300 MG capsule Take 1 capsule by mouth 3 (three) times daily. 07/15/23  Yes [provider]   lisinopril  (ZESTRIL ) 10 MG tablet Take 1 tablet (10 mg total) by mouth daily. 08/11/21  Yes Nikki Barters, MD  metoprolol  succinate (TOPROL -XL) 50 MG 24 hr tablet TAKE 1 TABLET BY MOUTH DAILY WITH OR IMMEDIATELY FOLLOWING A MEAL 12/24/21  Yes Nikki Barters, MD  omeprazole  (PRILOSEC) 20 MG capsule Take 1 capsule (20 mg total) by mouth daily. 04/29/22  Yes Trenton Frock, PA-C  tamsulosin  (FLOMAX ) 0.4 MG CAPS capsule Take 1 capsule (0.4 mg total) by mouth daily. 03/24/22  Yes Rumball, Alison M, DO  venlafaxine  XR (EFFEXOR -XR) 75 MG 24 hr capsule Take 3 capsules (225 mg total) by mouth every morning. 03/05/22  Yes Drubel, Heidi Llamas, PA-C  alprazolam  (XANAX ) 2 MG tablet TAKE 1/2 TABLET BY MOUTH UP TO TWICE A DAY AS NEEDED. MAXIMUM 1 TABLET PER DAY. 08/11/21   Nikki Barters, MD  azelastine  (ASTELIN ) 0.1 % nasal spray Place 1 spray into both nostrils 2 (two) times daily. Use in each nostril as directed 04/10/22   Drubel, Heidi Llamas, PA-C  azithromycin  (ZITHROMAX  Z-PAK) 250 MG tablet Take 2 tablets on day 1 then 1 tablet daily 08/20/23   Tasneem Cormier, DO  benzonatate  (TESSALON ) 100 MG capsule Take 1 capsule (100 mg total) by mouth every 8 (eight) hours. 08/20/23   Leobardo Granlund, DO  chlorpheniramine-HYDROcodone  (TUSSIONEX) 10-8 MG/5ML Take 5 mLs by mouth every 12 (twelve) hours as needed. 08/20/23   Dmiyah Liscano, DO  Cholecalciferol (VITAMIN D) 2000 UNITS CAPS Take 1 capsule by mouth daily.  02/10/11   [provider]  Melatonin 1 MG TABS Take 2 mg by mouth at bedtime.     [provider]  mirtazapine (REMERON) 15 MG tablet Take 15 mg by mouth. 07/13/23 04/08/24  [provider]  mupirocin  ointment (BACTROBAN ) 2 % Apply 1 Application topically 3 (three) times daily. 03/02/22   Crain, Whitney L, PA  predniSONE  (STERAPRED UNI-PAK 21 TAB) 10 MG (21) TBPK tablet Take by mouth daily. Take 6 tabs by mouth daily for 1, then 5 tabs for 1 day, then 4 tabs for 1 day, then 3 tabs for 1  day, then 2 tabs for 1 day, then 1 tab for 1 day. 08/20/23   Tiyah Zelenak, DO  spironolactone (ALDACTONE) 25 MG tablet Take by mouth. 03/17/18 05/31/19  [provider]    Family History Family History  Problem Relation Age of Onset   Arthritis Mother    Hypertension Mother    Colon cancer Mother    Other Mother        lymph node   Diabetes Father    Congestive Heart Failure Father    Arthritis Sister     Social History Social History   Tobacco Use   Smoking status: Former    Current packs/day: 0.00    Types: Cigarettes    Quit date: 1974    Years since quitting: 51.4   Smokeless tobacco: Never  Tobacco comments:    1974  Vaping Use   Vaping status: Never Used  Substance Use Topics   Alcohol use: Yes    Alcohol/week: 0.0 - 2.0 standard drinks of alcohol   Drug use: No     Allergies   Ciprofloxacin and Sulfamethoxazole-trimethoprim   Review of Systems Review of Systems: negative unless otherwise stated in HPI.      Physical Exam Triage Vital Signs ED Triage Vitals  Encounter Vitals Group     BP 09/23/23 1527 135/70     Girls Systolic BP Percentile --      Girls Diastolic BP Percentile --      Boys Systolic BP Percentile --      Boys Diastolic BP Percentile --      Pulse Rate 09/23/23 1527 74     Resp 09/23/23 1527 (!) 22     Temp 09/23/23 1527 98.2 F (36.8 C)     Temp Source 09/23/23 1527 Oral     SpO2 09/23/23 1527 93 %     Weight --      Height --      Head Circumference --      Peak Flow --      Pain Score 09/23/23 1526 0     Pain Loc --      Pain Education --      Exclude from Growth Chart --    No data found.  Updated Vital Signs BP 135/70 (BP Location: Right Arm)   Pulse 74   Temp 98.2 F (36.8 C) (Oral)   Resp (!) 22   SpO2 93%   Visual Acuity Right Eye Distance:   Left Eye Distance:   Bilateral Distance:    Right Eye Near:   Left Eye Near:    Bilateral Near:     Physical Exam GEN:     alert, non-toxic  appearing male in no distress ***   HENT:  mucus membranes moist, oropharyngeal ***without lesions or ***erythema, no*** tonsillar hypertrophy or exudates, *** moderate erythematous edematous turbinates, ***clear nasal discharge, ***bilateral TM normal EYES:   pupils equal and reactive, ***no scleral injection or discharge NECK:  normal ROM, no ***lymphadenopathy, ***no meningismus   RESP:  no increased work of breathing, ***clear to auscultation bilaterally CVS:   regular rate ***and rhythm Skin:   warm and dry, no rash on visible skin***    UC Treatments / Results  Labs (all labs ordered are listed, but only abnormal results are displayed) Labs Reviewed - No data to display  EKG   Radiology No results found.  Procedures Procedures (including critical care time)  Medications Ordered in UC Medications - No data to display  Initial Impression / Assessment and Plan / UC Course  I have reviewed the triage vital signs and the nursing notes.  Pertinent labs & imaging results that were available during my care of the patient were reviewed by me and considered in my medical decision making (see chart for details).       Pt is a 73 y.o. male who presents for *** days of respiratory symptoms. Jeffrey French is ***afebrile here without recent antipyretics. Satting well on room air. Overall pt is ***non-toxic appearing, well hydrated, without respiratory distress. Pulmonary exam ***is unremarkable.  COVID and influenza panel obtained ***and was negative. ***Pt to quarantine until COVID test results or longer if positive.  I will call patient with test results, if positive. History consistent with ***viral respiratory illness. Discussed symptomatic treatment.  Explained  lack of efficacy of antibiotics in viral disease.  Typical duration of symptoms discussed.   Return and ED precautions given and voiced understanding. Discussed MDM, treatment plan and plan for follow-up with patient*** who agrees  with plan.     Final Clinical Impressions(s) / UC Diagnoses   Final diagnoses:  None   Discharge Instructions   None    ED Prescriptions   None    PDMP not reviewed this encounter.

## 2023-09-23 NOTE — ED Triage Notes (Signed)
 Patient states that sx came back 1.5 weeks\ cough Sob Hard to catch breath  Patient was here on 5-16 with same sx. States sx got better then came back.

## 2024-01-12 ENCOUNTER — Ambulatory Visit (INDEPENDENT_AMBULATORY_CARE_PROVIDER_SITE_OTHER)

## 2024-01-12 DIAGNOSIS — D1801 Hemangioma of skin and subcutaneous tissue: Secondary | ICD-10-CM

## 2024-01-12 DIAGNOSIS — W908XXA Exposure to other nonionizing radiation, initial encounter: Secondary | ICD-10-CM

## 2024-01-12 DIAGNOSIS — Z872 Personal history of diseases of the skin and subcutaneous tissue: Secondary | ICD-10-CM

## 2024-01-12 DIAGNOSIS — D489 Neoplasm of uncertain behavior, unspecified: Secondary | ICD-10-CM | POA: Diagnosis not present

## 2024-01-12 DIAGNOSIS — D045 Carcinoma in situ of skin of trunk: Secondary | ICD-10-CM | POA: Diagnosis not present

## 2024-01-12 DIAGNOSIS — L821 Other seborrheic keratosis: Secondary | ICD-10-CM | POA: Diagnosis not present

## 2024-01-12 DIAGNOSIS — D229 Melanocytic nevi, unspecified: Secondary | ICD-10-CM

## 2024-01-12 DIAGNOSIS — L578 Other skin changes due to chronic exposure to nonionizing radiation: Secondary | ICD-10-CM

## 2024-01-12 DIAGNOSIS — L820 Inflamed seborrheic keratosis: Secondary | ICD-10-CM | POA: Diagnosis not present

## 2024-01-12 DIAGNOSIS — L814 Other melanin hyperpigmentation: Secondary | ICD-10-CM

## 2024-01-12 DIAGNOSIS — C4492 Squamous cell carcinoma of skin, unspecified: Secondary | ICD-10-CM

## 2024-01-12 DIAGNOSIS — Z1283 Encounter for screening for malignant neoplasm of skin: Secondary | ICD-10-CM

## 2024-01-12 HISTORY — DX: Squamous cell carcinoma of skin, unspecified: C44.92

## 2024-01-12 NOTE — Progress Notes (Signed)
 Subjective   Jeffrey French is a 73 y.o. male who presents for the following: Lesion(s) of concern . Patient is established patient   Today patient reports: Areas of concern on his chest and scalp  Review of Systems:    No other skin or systemic complaints except as noted in HPI or Assessment and Plan.  The following portions of the chart were reviewed this encounter and updated as appropriate: medications, allergies, medical history  Relevant Medical History:  Personal history of actinic keratosis   Objective  Well appearing patient in no apparent distress; mood and affect are within normal limits. Examination was performed of the: Sun Exposed Exam: Scalp, head, eyes, ears, nose, lips, neck, upper extremities, hands, fingers, fingernails, chest, abdomen, back Examination notable for: SKIN EXAM, Angioma(s): Scattered red vascular papule(s)  , Lentigo/lentigines: Scattered pigmented macules that are tan to brown in color and are somewhat non-uniform in shape and concentrated in the sun-exposed areas, Nevus/nevi: Scattered well-demarcated, regular, pigmented macule(s) and/or papule(s)  , Seborrheic Keratosis(es): Stuck-on appearing keratotic papule(s) on the trunk, some  irritated with redness, crusting, edema, and/or partial avulsion, Actinic Damage/Elastosis: chronic sun damage: dyspigmentation, telangiectasia, and wrinkling  Examination limited by: Clothing and Patient deferred removal    Scalp x3, Left shoulder x1 (4) Stuck on waxy paps with erythema Chest - Medial (Center) 1.2cm erythematous plaque    Assessment & Plan   SKIN CANCER SCREENING PERFORMED TODAY.  BENIGN SKIN FINDINGS  - Lentigines  - Seborrheic keratoses  - Hemangiomas   - Nevus/Multiple Benign Nevi  - Reassurance provided regarding the benign appearance of lesions noted on exam today; no treatment is indicated in the absence of symptoms/changes. - Reinforced importance of photoprotective strategies including  liberal and frequent sunscreen use of a broad-spectrum SPF 30 or greater, use of protective clothing, and sun avoidance for prevention of cutaneous malignancy and photoaging.  Counseled patient on the importance of regular self-skin monitoring as well as routine clinical skin examinations as scheduled.   ACTINIC DAMAGE - Chronic condition, secondary to cumulative UV/sun exposure - Recommend daily broad spectrum sunscreen SPF 30+ to sun-exposed areas, reapply every 2 hours as needed.  - Staying in the shade or wearing long sleeves, sun glasses (UVA+UVB protection) and wide brim hats (4-inch brim around the entire circumference of the hat) are also recommended for sun protection.  - Call for new or changing lesions.  Personal history of actinic keratosis  - Reviewed medical history for full details  - Reviewed sun protective measures as above - Encouraged full body skin exams     Level of service outlined above   Procedures, orders, diagnosis for this visit:  INFLAMED SEBORRHEIC KERATOSIS (4) Scalp x3, Left shoulder x1 (4) Symptomatic, irritating, patient would like treated. Destruction of lesion - Scalp x3, Left shoulder x1 (4) Complexity: simple   Destruction method: cryotherapy   Informed consent: discussed and consent obtained   Timeout:  patient name, date of birth, surgical site, and procedure verified Lesion destroyed using liquid nitrogen: Yes   Region frozen until ice ball extended beyond lesion: Yes   Cryo cycles: 1 or 2. Outcome: patient tolerated procedure well with no complications   Post-procedure details: wound care instructions given    NEOPLASM OF UNCERTAIN BEHAVIOR Chest - Medial (Center) Skin / nail biopsy Type of biopsy: tangential   Informed consent: discussed and consent obtained   Timeout: patient name, date of birth, surgical site, and procedure verified   Procedure prep:  Patient was prepped and draped in usual sterile fashion Prep type:  Isopropyl  alcohol Anesthesia: the lesion was anesthetized in a standard fashion   Anesthetic:  1% lidocaine  w/ epinephrine  1-100,000 buffered w/ 8.4% NaHCO3 Instrument used: DermaBlade   Hemostasis achieved with: pressure and aluminum chloride   Outcome: patient tolerated procedure well   Post-procedure details: sterile dressing applied and wound care instructions given   Dressing type: bandage and petrolatum     Inflamed seborrheic keratosis -     Destruction of lesion  Neoplasm of uncertain behavior -     Skin / nail biopsy    Return to clinic: Return in about 6 months (around 07/12/2024) for TBSE.  Documentation:  I, Emerick Ege, CMA am acting as scribe for Lauraine JAYSON Kanaris, MD  I have reviewed the above documentation for accuracy and completeness, and I agree with the above.  Lauraine JAYSON Kanaris, MD

## 2024-01-12 NOTE — Patient Instructions (Addendum)

## 2024-01-17 LAB — SURGICAL PATHOLOGY

## 2024-01-18 ENCOUNTER — Ambulatory Visit: Payer: Self-pay

## 2024-01-18 MED ORDER — FLUOROURACIL 5 % EX CREA: TOPICAL_CREAM | Freq: Two times a day (BID) | CUTANEOUS | 0 refills | Status: AC

## 2024-01-18 NOTE — Telephone Encounter (Signed)
-----   Message from Lauraine JAYSON Kanaris sent at 01/18/2024 12:27 PM EDT -----    1. Skin, chest - medial center :       SQUAMOUS CELL CARCINOMA IN SITU ARISING IN A SEBORRHEIC KERATOSIS, INFLAMED   Please notify patient with below plan: - Start efudex 5% cream (5-fluorouracil) twice daily for 8 weeks if tolerated  - Discussed the longer the product is used the more effective it is but ok to stop if unable to tolerate  - Educated on the risk of redness, irritation, pain. Advised to hold treatment if developing side effects.  - Wash hands after use - Advised sun protection and avoidance  Please schedule follow up 6 weeks after completion of treatment (in ~ 4 months)   ----- Message ----- From: Interface, [facility] Sent: 01/17/2024   8:52 PM EDT To: Lauraine JAYSON Kanaris, MD

## 2024-01-18 NOTE — Telephone Encounter (Signed)
 Patient advised of BX results, RX sent in and patient scheduled for follow up./ aw

## 2024-05-17 ENCOUNTER — Ambulatory Visit

## 2024-07-12 ENCOUNTER — Ambulatory Visit
